# Patient Record
Sex: Female | Born: 1935 | Race: White | Hispanic: No | Smoking: Former smoker
Health system: Southern US, Community
[De-identification: ages and names within clinical notes are randomized; demographics above are authoritative.]

## PROBLEM LIST (undated history)

## (undated) DIAGNOSIS — I10 Essential (primary) hypertension: Secondary | ICD-10-CM

## (undated) DIAGNOSIS — I82409 Acute embolism and thrombosis of unspecified deep veins of unspecified lower extremity: Secondary | ICD-10-CM

## (undated) DIAGNOSIS — K219 Gastro-esophageal reflux disease without esophagitis: Secondary | ICD-10-CM

## (undated) DIAGNOSIS — I442 Atrioventricular block, complete: Secondary | ICD-10-CM

## (undated) DIAGNOSIS — F329 Major depressive disorder, single episode, unspecified: Secondary | ICD-10-CM

## (undated) DIAGNOSIS — N390 Urinary tract infection, site not specified: Secondary | ICD-10-CM

## (undated) DIAGNOSIS — E876 Hypokalemia: Secondary | ICD-10-CM

## (undated) DIAGNOSIS — F32A Depression, unspecified: Secondary | ICD-10-CM

## (undated) DIAGNOSIS — K56609 Unspecified intestinal obstruction, unspecified as to partial versus complete obstruction: Secondary | ICD-10-CM

## (undated) HISTORY — PX: INSERT / REPLACE / REMOVE PACEMAKER: SUR710

## (undated) HISTORY — DX: Essential (primary) hypertension: I10

## (undated) HISTORY — DX: Urinary tract infection, site not specified: N39.0

## (undated) HISTORY — DX: Gastro-esophageal reflux disease without esophagitis: K21.9

## (undated) HISTORY — DX: Major depressive disorder, single episode, unspecified: F32.9

## (undated) HISTORY — DX: Acute embolism and thrombosis of unspecified deep veins of unspecified lower extremity: I82.409

## (undated) HISTORY — DX: Depression, unspecified: F32.A

## (undated) HISTORY — DX: Atrioventricular block, complete: I44.2

## (undated) HISTORY — DX: Unspecified intestinal obstruction, unspecified as to partial versus complete obstruction: K56.609

## (undated) HISTORY — DX: Hypokalemia: E87.6

## (undated) SURGERY — ERCP, WITH INTERVENTION IF INDICATED
Anesthesia: Choice

---

## 1998-02-08 ENCOUNTER — Other Ambulatory Visit: Admission: RE | Admit: 1998-02-08 | Discharge: 1998-02-08 | Payer: Self-pay | Admitting: Family Medicine

## 1999-04-13 ENCOUNTER — Ambulatory Visit (HOSPITAL_COMMUNITY): Admission: RE | Admit: 1999-04-13 | Discharge: 1999-04-13 | Payer: Self-pay | Admitting: Gastroenterology

## 1999-04-26 ENCOUNTER — Encounter: Payer: Self-pay | Admitting: General Surgery

## 1999-04-28 ENCOUNTER — Encounter (INDEPENDENT_AMBULATORY_CARE_PROVIDER_SITE_OTHER): Payer: Self-pay | Admitting: Specialist

## 1999-04-28 ENCOUNTER — Inpatient Hospital Stay (HOSPITAL_COMMUNITY): Admission: RE | Admit: 1999-04-28 | Discharge: 1999-05-10 | Payer: Self-pay | Admitting: General Surgery

## 1999-05-05 ENCOUNTER — Encounter: Payer: Self-pay | Admitting: General Surgery

## 1999-05-06 ENCOUNTER — Encounter: Payer: Self-pay | Admitting: General Surgery

## 1999-05-07 ENCOUNTER — Encounter: Payer: Self-pay | Admitting: General Surgery

## 2000-06-13 ENCOUNTER — Other Ambulatory Visit: Admission: RE | Admit: 2000-06-13 | Discharge: 2000-06-13 | Payer: Self-pay | Admitting: Obstetrics and Gynecology

## 2000-09-07 ENCOUNTER — Emergency Department (HOSPITAL_COMMUNITY): Admission: EM | Admit: 2000-09-07 | Discharge: 2000-09-07 | Payer: Self-pay | Admitting: *Deleted

## 2000-10-02 ENCOUNTER — Encounter: Payer: Self-pay | Admitting: Family Medicine

## 2000-10-02 ENCOUNTER — Ambulatory Visit (HOSPITAL_COMMUNITY): Admission: RE | Admit: 2000-10-02 | Discharge: 2000-10-02 | Payer: Self-pay | Admitting: Family Medicine

## 2001-06-19 ENCOUNTER — Other Ambulatory Visit: Admission: RE | Admit: 2001-06-19 | Discharge: 2001-06-19 | Payer: Self-pay | Admitting: Obstetrics and Gynecology

## 2002-05-21 ENCOUNTER — Ambulatory Visit (HOSPITAL_COMMUNITY): Admission: RE | Admit: 2002-05-21 | Discharge: 2002-05-21 | Payer: Self-pay | Admitting: Gastroenterology

## 2002-05-21 ENCOUNTER — Encounter (INDEPENDENT_AMBULATORY_CARE_PROVIDER_SITE_OTHER): Payer: Self-pay | Admitting: Specialist

## 2002-09-19 ENCOUNTER — Encounter: Payer: Self-pay | Admitting: *Deleted

## 2002-09-19 ENCOUNTER — Emergency Department (HOSPITAL_COMMUNITY): Admission: EM | Admit: 2002-09-19 | Discharge: 2002-09-19 | Payer: Self-pay | Admitting: *Deleted

## 2002-11-06 ENCOUNTER — Encounter: Payer: Self-pay | Admitting: Internal Medicine

## 2002-11-06 ENCOUNTER — Inpatient Hospital Stay (HOSPITAL_COMMUNITY): Admission: EM | Admit: 2002-11-06 | Discharge: 2002-11-08 | Payer: Self-pay | Admitting: Emergency Medicine

## 2002-11-08 ENCOUNTER — Encounter: Payer: Self-pay | Admitting: Cardiology

## 2005-03-24 ENCOUNTER — Encounter (INDEPENDENT_AMBULATORY_CARE_PROVIDER_SITE_OTHER): Payer: Self-pay | Admitting: *Deleted

## 2005-03-24 ENCOUNTER — Ambulatory Visit (HOSPITAL_COMMUNITY): Admission: RE | Admit: 2005-03-24 | Discharge: 2005-03-24 | Payer: Self-pay | Admitting: Gastroenterology

## 2008-09-08 ENCOUNTER — Inpatient Hospital Stay (HOSPITAL_COMMUNITY): Admission: AD | Admit: 2008-09-08 | Discharge: 2008-09-12 | Payer: Self-pay | Admitting: Cardiovascular Disease

## 2008-09-08 ENCOUNTER — Ambulatory Visit: Payer: Self-pay | Admitting: Diagnostic Radiology

## 2008-09-08 ENCOUNTER — Encounter: Payer: Self-pay | Admitting: Emergency Medicine

## 2008-09-09 ENCOUNTER — Encounter (INDEPENDENT_AMBULATORY_CARE_PROVIDER_SITE_OTHER): Payer: Self-pay | Admitting: *Deleted

## 2009-07-06 ENCOUNTER — Encounter: Payer: Self-pay | Admitting: Internal Medicine

## 2010-01-25 ENCOUNTER — Encounter: Payer: Self-pay | Admitting: Internal Medicine

## 2010-02-28 ENCOUNTER — Inpatient Hospital Stay (HOSPITAL_COMMUNITY): Admission: EM | Admit: 2010-02-28 | Discharge: 2010-03-07 | Payer: Self-pay | Admitting: Emergency Medicine

## 2010-03-02 ENCOUNTER — Ambulatory Visit: Payer: Self-pay | Admitting: Vascular Surgery

## 2010-03-02 ENCOUNTER — Ambulatory Visit: Payer: Self-pay | Admitting: Cardiology

## 2010-03-02 ENCOUNTER — Encounter (INDEPENDENT_AMBULATORY_CARE_PROVIDER_SITE_OTHER): Payer: Self-pay | Admitting: Internal Medicine

## 2010-03-04 ENCOUNTER — Ambulatory Visit: Payer: Self-pay | Admitting: Physical Medicine & Rehabilitation

## 2010-03-07 ENCOUNTER — Inpatient Hospital Stay (HOSPITAL_COMMUNITY)
Admission: RE | Admit: 2010-03-07 | Discharge: 2010-03-08 | Payer: Self-pay | Admitting: Physical Medicine & Rehabilitation

## 2010-03-08 ENCOUNTER — Inpatient Hospital Stay (HOSPITAL_COMMUNITY): Admission: AD | Admit: 2010-03-08 | Discharge: 2010-03-11 | Payer: Self-pay | Admitting: Gastroenterology

## 2010-03-11 ENCOUNTER — Ambulatory Visit: Payer: Self-pay | Admitting: Physical Medicine & Rehabilitation

## 2010-03-11 ENCOUNTER — Inpatient Hospital Stay (HOSPITAL_COMMUNITY)
Admission: RE | Admit: 2010-03-11 | Discharge: 2010-03-23 | Payer: Self-pay | Admitting: Physical Medicine & Rehabilitation

## 2010-05-07 ENCOUNTER — Encounter: Payer: Self-pay | Admitting: Internal Medicine

## 2010-06-27 ENCOUNTER — Ambulatory Visit: Payer: Self-pay | Admitting: Internal Medicine

## 2010-07-06 ENCOUNTER — Ambulatory Visit: Payer: Self-pay | Admitting: Internal Medicine

## 2010-07-06 DIAGNOSIS — E039 Hypothyroidism, unspecified: Secondary | ICD-10-CM

## 2010-07-06 DIAGNOSIS — M109 Gout, unspecified: Secondary | ICD-10-CM | POA: Insufficient documentation

## 2010-07-06 DIAGNOSIS — E876 Hypokalemia: Secondary | ICD-10-CM

## 2010-07-06 DIAGNOSIS — D649 Anemia, unspecified: Secondary | ICD-10-CM | POA: Insufficient documentation

## 2010-07-06 DIAGNOSIS — I10 Essential (primary) hypertension: Secondary | ICD-10-CM

## 2010-07-06 LAB — CONVERTED CEMR LAB
BUN: 27 mg/dL — ABNORMAL HIGH (ref 6–23)
Basophils Relative: 0.3 % (ref 0.0–3.0)
CO2: 30 meq/L (ref 19–32)
Creatinine, Ser: 0.7 mg/dL (ref 0.4–1.2)
Eosinophils Absolute: 0.1 10*3/uL (ref 0.0–0.7)
Eosinophils Relative: 0.9 % (ref 0.0–5.0)
HCT: 35.3 % — ABNORMAL LOW (ref 36.0–46.0)
MCV: 80.6 fL (ref 78.0–100.0)
Monocytes Absolute: 0.6 10*3/uL (ref 0.1–1.0)
Monocytes Relative: 10 % (ref 3.0–12.0)
Neutrophils Relative %: 63.9 % (ref 43.0–77.0)
Potassium: 4.2 meq/L (ref 3.5–5.1)
RBC: 4.37 M/uL (ref 3.87–5.11)
WBC: 6.3 10*3/uL (ref 4.5–10.5)

## 2010-07-07 ENCOUNTER — Encounter: Payer: Self-pay | Admitting: Internal Medicine

## 2010-07-12 ENCOUNTER — Ambulatory Visit (HOSPITAL_COMMUNITY)
Admission: RE | Admit: 2010-07-12 | Discharge: 2010-07-12 | Payer: Self-pay | Source: Home / Self Care | Attending: Internal Medicine | Admitting: Internal Medicine

## 2010-07-19 ENCOUNTER — Encounter: Payer: Self-pay | Admitting: Internal Medicine

## 2010-07-20 ENCOUNTER — Ambulatory Visit: Payer: Self-pay

## 2010-08-05 ENCOUNTER — Encounter: Payer: Self-pay | Admitting: Cardiovascular Disease

## 2010-08-05 DIAGNOSIS — F329 Major depressive disorder, single episode, unspecified: Secondary | ICD-10-CM | POA: Insufficient documentation

## 2010-08-05 DIAGNOSIS — F32A Depression, unspecified: Secondary | ICD-10-CM | POA: Insufficient documentation

## 2010-08-05 DIAGNOSIS — K219 Gastro-esophageal reflux disease without esophagitis: Secondary | ICD-10-CM | POA: Insufficient documentation

## 2010-08-05 DIAGNOSIS — I495 Sick sinus syndrome: Secondary | ICD-10-CM | POA: Insufficient documentation

## 2010-08-05 DIAGNOSIS — I442 Atrioventricular block, complete: Secondary | ICD-10-CM | POA: Insufficient documentation

## 2010-08-05 DIAGNOSIS — I82409 Acute embolism and thrombosis of unspecified deep veins of unspecified lower extremity: Secondary | ICD-10-CM | POA: Insufficient documentation

## 2010-08-05 DIAGNOSIS — K56609 Unspecified intestinal obstruction, unspecified as to partial versus complete obstruction: Secondary | ICD-10-CM | POA: Insufficient documentation

## 2010-08-05 DIAGNOSIS — R079 Chest pain, unspecified: Secondary | ICD-10-CM | POA: Insufficient documentation

## 2010-08-05 DIAGNOSIS — Z950 Presence of cardiac pacemaker: Secondary | ICD-10-CM | POA: Insufficient documentation

## 2010-08-05 DIAGNOSIS — N390 Urinary tract infection, site not specified: Secondary | ICD-10-CM | POA: Insufficient documentation

## 2010-08-25 NOTE — Cardiovascular Report (Signed)
Summary: Office Visit   Office Visit   Imported By: Roderic Ovens 07/14/2010 13:55:21  _____________________________________________________________________  External Attachment:    Type:   Image     Comment:   External Document

## 2010-08-25 NOTE — Letter (Signed)
Summary: Implantable Device Instructions  Architectural technologist, Main Office  [address]   [place], [place] [address]   Phone: [phone]  Fax: [phone]      Implantable Device Instructions  You are scheduled for:   _____ Generator Change  on 07/12/10 with Dr. Johney Frame.  1.  Please arrive at the Short Stay Center at [facility] at 12:30 on the day of your procedure.  2.  Do not eat or drinkafter midnight  the night before your procedure. Clear liquids until 7am  3.  Complete lab work on 07/06/10.  .  You do not have to be fasting.  4. You may take you medications with a small amount of water.  5.  Plan for an overnight stay.  Bring your insurance cards and a list of your medications.  6.  Wash your chest and neck with antibacterial soap (any brand) the evening before and the morning of your procedure.  Rinse well.    *If you have ANY questions after you get home, please call the office [phone]. Anselm Pancoast  *Every attempt is made to prevent procedures from being rescheduled.  Due to the nauture of Electrophysiology, rescheduling can happen.  The physician is always aware and directs the staff when this occurs.

## 2010-08-25 NOTE — Assessment & Plan Note (Signed)
Summary: labs   

## 2010-08-25 NOTE — Letter (Signed)
Summary: [facility] Progress Notes   [facility] Progress Notes   Imported By: Roderic Ovens 07/27/2010 15:01:35  _____________________________________________________________________  External Attachment:    Type:   Image     Comment:   External Document

## 2010-08-25 NOTE — Cardiovascular Report (Signed)
Summary: Office Visit   Office Visit   Imported By: Roderic Ovens 07/04/2010 13:16:33  _____________________________________________________________________  External Attachment:    Type:   Image     Comment:   External Document

## 2010-08-25 NOTE — Letter (Signed)
Summary: [facility] Progress Notes   [facility] Progress Notes   Imported By: Roderic Ovens 07/27/2010 15:01:13  _____________________________________________________________________  External Attachment:    Type:   Image     Comment:   External Document

## 2010-08-25 NOTE — Procedures (Signed)
Summary: wch.gd   Allergies (verified): 1)  ! Penicillin 2)  ! Jonne Ply  PPM Specifications Following MD:  Hillis Range, MD     Referring MD:  Shaune Pascal, MD PPM Vendor:  Medtronic     PPM Model Number:  [id number]     PPM Serial Number:  [id number] PPM DOI:  07/13/2010     PPM Implanting MD:  Hillis Range, MD  Lead 1    Location: RA     DOI: 11/07/2002     Model #: [id number]     Serial #: [id number]     Status: active Lead 2    Location: RV     DOI: 11/07/2002     Model #: [id number]     Serial #: [id number]     Status: active  Magnet Response Rate:  BOL 85 ERI 65  Indications:  CHB  Explantation Comments:  07/13/10 Medtronic [id number] H explanted.  PPM Follow Up Remote Check?  No Battery Voltage:  2.79 V     Battery Est. Longevity:  11 years     Pacer Dependent:  Yes       PPM Device Measurements Atrium  Amplitude: 4.0 mV, Impedance: 470 ohms, Threshold: 0.625 V at 0.4 msec Right Ventricle  Impedance: 576 ohms, Threshold: 0.75 V at 0.4 msec  Episodes MS Episodes:  0     Percent Mode Switch:  0     Coumadin:  No Ventricular High Rate:  0     Atrial Pacing:  82.3%     Ventricular Pacing:  100%  Parameters Mode:  VVI     Lower Rate Limit:  65     Next Cardiology Appt Due:  10/21/2010 Tech Comments:  Steri strips removed, no redness or edema noted.  No parameter changes.  Device function normal.   ROV 10/21/10 with Dr. Johney Frame. Altha Harm, LPN  July 20, 2010 4:25 PM

## 2010-08-25 NOTE — Miscellaneous (Signed)
Summary: Device preload  Clinical Lists Changes  Observations: Added new observation of PPM INDICATN: CHB (05/07/2010 15:26) Added new observation of MAGNET RTE: BOL 85 ERI 65 (05/07/2010 15:26) Added new observation of PPMLEADSTAT2: active (05/07/2010 15:26) Added new observation of PPMLEADSER2: [id number] (05/07/2010 15:26) Added new observation of PPMLEADMOD2: 5092  (05/07/2010 15:26) Added new observation of PPMLEADDOI2: 11/07/2002  (05/07/2010 15:26) Added new observation of PPMLEADLOC2: RV  (05/07/2010 15:26) Added new observation of PPMLEADSTAT1: active  (05/07/2010 15:26) Added new observation of PPMLEADSER1: [id number] V  (05/07/2010 15:26) Added new observation of PPMLEADMOD1: 5076  (05/07/2010 15:26) Added new observation of PPMLEADDOI1: 11/07/2002  (05/07/2010 15:26) Added new observation of PPMLEADLOC1: RA  (05/07/2010 15:26) Added new observation of PPM DOI: 11/07/2002  (05/07/2010 15:26) Added new observation of PPM SERL#: [id number]  (05/07/2010 15:26) Added new observation of PPM MODL#: [id number]  (05/07/2010 01:02) Added new observation of PACEMAKERMFG: Medtronic  (05/07/2010 15:26) Added new observation of PPM REFER MD: Shaune Pascal, MD  (05/07/2010 15:26) Added new observation of PACEMAKER MD: Hillis Range, MD  (05/07/2010 15:26)      PPM Specifications Following MD:  Hillis Range, MD     Referring MD:  Shaune Pascal, MD PPM Vendor:  Medtronic     PPM Model Number:  [id number]     PPM Serial Number:  [id number] PPM DOI:  11/07/2002      Lead 1    Location: RA     DOI: 11/07/2002     Model #: [id number]     Serial #: [id number]     Status: active Lead 2    Location: RV     DOI: 11/07/2002     Model #: [id number]     Serial #: [id number]     Status: active  Magnet Response Rate:  BOL 85 ERI 65  Indications:  CHB

## 2010-08-25 NOTE — Procedures (Signed)
Summary: pacer check/medtronic   Current Medications (verified): 1)  Diltiazem Hcl Cr 180 Mg Xr24h-Cap (Diltiazem Hcl) .... One By Mouth Daily 2)  Allopurinol 100 Mg Tabs (Allopurinol) .... One By Mouth Daily 3)  Synthroid 125 Mcg Tabs (Levothyroxine Sodium) .... One By Mouth Daily 4)  Gelnique 10 % Gel (Oxybutynin Chloride) .... Daily 5)  Alprazolam 0.25 Mg Tabs (Alprazolam) .... One By Mouth At Bedtime  Allergies (verified): 1)  ! Penicillin 2)  ! Jonne Ply  PPM Specifications Following MD:  Hillis Range, MD     Referring MD:  Shaune Pascal, MD PPM Vendor:  Medtronic     PPM Model Number:  [id number]     PPM Serial Number:  [id number] PPM DOI:  11/07/2002      Lead 1    Location: RA     DOI: 11/07/2002     Model #: [id number]     Serial #: [id number]     Status: active Lead 2    Location: RV     DOI: 11/07/2002     Model #: [id number]     Serial #: [id number]     Status: active  Magnet Response Rate:  BOL 85 ERI 65  Indications:  CHB   PPM Follow Up Remote Check?  No Battery Voltage:  ERI V     Right Ventricle  Amplitude: PACED mV, Impedance: 609 ohms,   Episodes MS Episodes:  0     Percent Mode Switch:  0     Ventricular High Rate:  0     Ventricular Pacing:  98.8%  Parameters Mode:  VVI     Lower Rate Limit:  65     Next Cardiology Appt Due:  07/06/2010 Tech Comments:  DEVICE REACHED ERI ON 06-27-10.  DEVICE HAS SWITCHED TO VVI 65.  ROV 07-06-10 @ 1100 W/JA. Vella Kohler  June 27, 2010 11:56 AM   MED LIST NOT COMPLETE ON TODAYS OFFICE VISIT.   Patient Instructions: 1)  Pt scheduled to see Dr Johney Frame on 07-06-10 @ 1100am.

## 2010-08-25 NOTE — Miscellaneous (Signed)
Summary: Device change out  Clinical Lists Changes  Observations: Added new observation of PPM IMP MD: Hillis Range, MD (07/19/2010 16:00) Added new observation of PPM DOI: 07/13/2010 (07/19/2010 16:00) Added new observation of PPM SERL#: [id number] (07/19/2010 16:00) Added new observation of PPM MODL#: [id number] (07/19/2010 16:00) Added new observation of PPMEXPLCOMM: 07/13/10 Medtronic [id number] H explanted. (07/19/2010 16:00)      PPM Specifications Following MD:  Hillis Range, MD     Referring MD:  Shaune Pascal, MD PPM Vendor:  Medtronic     PPM Model Number:  ADDRL1     PPM Serial Number:  [id number] H PPM DOI:  07/13/2010     PPM Implanting MD:  Hillis Range, MD  Lead 1    Location: RA     DOI: 11/07/2002     Model #: [id number]     Serial #: [id number]     Status: active Lead 2    Location: RV     DOI: 11/07/2002     Model #: [id number]     Serial #: [id number]     Status: active  Magnet Response Rate:  BOL 85 ERI 65  Indications:  CHB  Explantation Comments:  07/13/10 Medtronic [id number] H explanted.  Parameters Mode:  VVI     Lower Rate Limit:  65

## 2010-08-25 NOTE — Assessment & Plan Note (Signed)
Summary: reached eri/mt   Visit Type:  Follow-up Referring Provider:  Dr Elease Hashimoto   History of Present Illness: [name] is a pleasant 77 yo WF with a h/o sick sinus syndrome s/p PPM (MDT) implanted 11/07/2002 by Dr Ty Hilts who presents today to establish care in the pacemaker clinic.  SHe has done well s/p PPM.  Most recently, she developed GI bleeding and was hospitalized 8/11.  She reports fatigue since that time.  Recent pacemaker check reveals that she has reached ERI battery status and has converted to VVI backup pacing.  The patient denies symptoms of palpitations, chest pain, shortness of breath, orthopnea, PND, lower extremity edema, dizziness, presyncope, syncope, or neurologic sequela. The patient is tolerating medications without difficulties and is otherwise without complaint today.   Allergies: 1)  ! Penicillin 2)  ! Jonne Ply  Past History:  Past Medical History: Recent GI bleeding GOUT (ICD-274.9) HYPOKALEMIA (ICD-276.8) HYPOTHYROIDISM (ICD-244.9) ANEMIA (ICD-285.9) SICK SINUS SYNDROME s/p PPM 2004 HYPERTENSION (ICD-401.9)  Past Surgical History: s/p MDT Pacemaker 2004  Family History: Reviewed history from 07/06/2010 and no changes required.  Her son died of an MI in his early 55s, and her father   died in his 12s with a CVA.      Social History: Reviewed history from 07/06/2010 and no changes required. She lives at home alone, and her great grandson and   sister-in-law assist her as needed.  She denies tobacco.  She also   denies alcohol.      Review of Systems       All systems are reviewed and negative except as listed in the HPI.   Vital Signs:  Patient profile:   77 year old female Height:      64 inches Weight:      142 pounds BMI:     24.46 Pulse rate:   64 / minute BP sitting:   90 / 60  (left arm)  Vitals Entered By: Laurance Flatten CMA (July 06, 2010 11:27 AM)  Physical Exam  General:  elderly female, NAD Head:  normocephalic and  atraumatic Eyes:  PERRLA/EOM intact; conjunctiva and lids normal. Mouth:  Teeth, gums and palate normal. Oral mucosa normal. Neck:  supple Chest Wall:  L sided pacemaker pocket is well healed Lungs:  Clear bilaterally to auscultation and percussion. Heart:  RRR (Paced) Abdomen:  Bowel sounds positive; abdomen soft and non-tender without masses, organomegaly, or hernias noted. No hepatosplenomegaly. Msk:  diffuse muscle atrophy Extremities:  trace edema Neurologic:  Alert and oriented x 3. Skin:  Intact without lesions or rashes.   PPM Specifications Following MD:  Hillis Range, MD     Referring MD:  Shaune Pascal, MD PPM Vendor:  Medtronic     PPM Model Number:  [id number]     PPM Serial Number:  [id number] PPM DOI:  11/07/2002      Lead 1    Location: RA     DOI: 11/07/2002     Model #: 9147     Serial #: [id number]     Status: active Lead 2    Location: RV     DOI: 11/07/2002     Model #: 8657     Serial #: [id number]     Status: active  Magnet Response Rate:  BOL 85 ERI 65  Indications:  CHB   Parameters Mode:  VVI     Lower Rate Limit:  65     Tech Comments:  DEVICE REACHED ERI ON 06-27-10. SWITCHED  TO VVI 65. Vella Kohler MD Comments:  agree  Impression & Recommendations:  Problem # 1:  SICK SINUS SYNDROME (ICD-427.81) her pacemaker has reached ERI battery status. Risks, benefits, alternatives to pacemaker pulse generator replacement were discussed in detail with the patient today.  She understands that the risks include but are not limited to bleeding, infection, MI, stroke, and death.  She accepts these risks and wishes to proceed.   We will schedule PPM pulse generator replacement at the next available time.   Problem # 2:  HYPERTENSION (ICD-401.9) stable no changes  Problem # 3:  HYPOTHYROIDISM (ICD-244.9) stable  Her updated medication list for this problem includes:    Levoxyl 112 Mcg Tabs (Levothyroxine sodium) ..... Once daily  Problem # 4:  ANEMIA  (ICD-285.9) denies further bleeding we will check CBC prior to PPM procedure

## 2010-08-25 NOTE — Cardiovascular Report (Signed)
Summary: Pre Op Orders   Pre Op Orders   Imported By: Roderic Ovens 07/29/2010 16:15:04  _____________________________________________________________________  External Attachment:    Type:   Image     Comment:   External Document

## 2010-08-29 ENCOUNTER — Encounter: Payer: Self-pay | Admitting: Cardiovascular Disease

## 2010-08-29 ENCOUNTER — Ambulatory Visit (INDEPENDENT_AMBULATORY_CARE_PROVIDER_SITE_OTHER): Payer: PRIVATE HEALTH INSURANCE | Admitting: Cardiovascular Disease

## 2010-08-29 VITALS — BP 118/60 | HR 66 | Wt 143.6 lb

## 2010-08-29 DIAGNOSIS — E039 Hypothyroidism, unspecified: Secondary | ICD-10-CM

## 2010-08-29 DIAGNOSIS — Z950 Presence of cardiac pacemaker: Secondary | ICD-10-CM

## 2010-08-29 DIAGNOSIS — R079 Chest pain, unspecified: Secondary | ICD-10-CM

## 2010-08-29 MED ORDER — LEVOTHYROXINE SODIUM 112 MCG PO TABS: 112.0000 ug | ORAL_TABLET | Freq: Every day | ORAL | Status: DC

## 2010-08-29 NOTE — Assessment & Plan Note (Signed)
Her chest pain seems to be well-controlled. She's not having any further episodes. We will continue to follow.

## 2010-08-29 NOTE — Progress Notes (Signed)
Patient Active Problem List  Diagnoses  . Chest pain, unspecified  . Hypothyroid  . Small bowel obstruction  . Pacemaker  . Depression  . Hypertension  . AV block  . Hypokalemia  . Complete heart block  . Recurrent UTI  . SSS (sick sinus syndrome)  . GOUT  . GERD (gastroesophageal reflux disease)  . DVT (deep venous thrombosis)  . HYPOTHYROIDISM  . HYPOKALEMIA  . ANEMIA  . HYPERTENSION  . SICK SINUS SYNDROME   Current Outpatient Prescriptions on File Prior to Visit  Medication Sig Dispense Refill  . diltiazem (DILACOR XR) 180 MG 24 hr capsule Take 180 mg by mouth daily.        Marland Kitchen allopurinol (ZYLOPRIM) 100 MG tablet Take 100 mg by mouth daily.        Marland Kitchen ALPRAZolam (XANAX PO) Take by mouth as needed.        Marland Kitchen aspirin 81 MG tablet Take 81 mg by mouth daily.        . benazepril-hydrochlorthiazide (LOTENSIN HCT) 20-12.5 MG per tablet Take 1 tablet by mouth daily.        Marland Kitchen levothyroxine (SYNTHROID, LEVOTHROID) 125 MCG tablet Take 125 mcg by mouth daily.        . Oxybutynin Chloride (GELNIQUE TD) Place onto the skin daily.          Mrs. Katina Degree is an elderly female who is seen today in followup. She has stopped a lot of her medications. She's not had any episodes of chest pain or shortness of breath.  The patient is alert and oriented x 3.  The mood and affect are normal.  The HEENT exam reveals that the sclera are nonicteric.  The mucous membranes are moist.  The carotids are 2+ without bruits.  There is no thyromegaly.  There is no JVD.  The lungs are clear.  The chest wall is non tender.  The heart exam reveals a regular rate with a normal S1 and S2.  There are no murmurs, gallops, or rubs.  The PMI is not displaced.   Abdominal exam reveals good bowel sounds.  There is no guarding or rebound.  There is no hepatosplenomegaly or tenderness.  There are no masses.  Exam of the legs reveal no clubbing, cyanosis, or edema.  The legs are without rashes.  The distal pulses are intact.   Cranial nerves II - XII are intact.  Motor and sensory functions are intact.  The gait is normal.

## 2010-08-29 NOTE — Assessment & Plan Note (Signed)
This is been followed by her medical doctor.Marland Kitchen

## 2010-08-29 NOTE — Assessment & Plan Note (Signed)
She will continue to followup in pacer clinic.

## 2010-09-28 ENCOUNTER — Encounter (INDEPENDENT_AMBULATORY_CARE_PROVIDER_SITE_OTHER): Payer: Self-pay | Admitting: *Deleted

## 2010-10-04 NOTE — Letter (Signed)
Summary: Appointment - Reschedule  [employer], Main Office  [address]   [place], [place] [address]   Phone: [phone]  Fax: [phone]     September 28, 2010 MRN: [id number]   [facility] [address] [place], [place]  [address]   Dear [name],   Due to a change in our office schedule, your appointment on  3.30.12 at   9:20am     must be changed.  It is very important that we reach you to reschedule this appointment. We look forward to participating in your health care needs. Please contact us at the number listed above at your earliest convenience to reschedule this appointment.     Sincerely,  Glass blower/designer

## 2010-10-06 LAB — URINE MICROSCOPIC-ADD ON

## 2010-10-06 LAB — BASIC METABOLIC PANEL
BUN: 15 mg/dL (ref 6–23)
BUN: 9 mg/dL (ref 6–23)
CO2: 22 mEq/L (ref 19–32)
Calcium: 6.9 mg/dL — ABNORMAL LOW (ref 8.4–10.5)
Calcium: 7.8 mg/dL — ABNORMAL LOW (ref 8.4–10.5)
Calcium: 8.5 mg/dL (ref 8.4–10.5)
Chloride: 110 mEq/L (ref 96–112)
Chloride: 112 mEq/L (ref 96–112)
Creatinine, Ser: 0.5 mg/dL (ref 0.4–1.2)
Creatinine, Ser: 0.61 mg/dL (ref 0.4–1.2)
GFR calc Af Amer: >60 mL/min (ref >60)
GFR calc Af Amer: >60 mL/min (ref >60)
GFR calc Af Amer: >60 mL/min (ref >60)
GFR calc non Af Amer: >60 mL/min (ref >60)
Glucose, Bld: 105 mg/dL — ABNORMAL HIGH (ref 70–99)
Glucose, Bld: 98 mg/dL (ref 70–99)
Sodium: 138 mEq/L (ref 135–145)

## 2010-10-06 LAB — URINALYSIS, ROUTINE W REFLEX MICROSCOPIC
Glucose, UA: NEGATIVE mg/dL
Nitrite: NEGATIVE
Protein, ur: NEGATIVE mg/dL
Specific Gravity, Urine: 1.019 (ref 1.005–1.030)
Urobilinogen, UA: 0.2 mg/dL (ref 0.0–1.0)
pH: 5.5 (ref 5.0–8.0)

## 2010-10-06 LAB — URINE CULTURE: Colony Count: 60000

## 2010-10-06 LAB — CROSSMATCH: ABO/RH(D): O POS

## 2010-10-06 LAB — CBC
HCT: 20.9 % — ABNORMAL LOW (ref 36.0–46.0)
Hemoglobin: 7.1 g/dL — ABNORMAL LOW (ref 12.0–15.0)
Hemoglobin: 8.7 g/dL — ABNORMAL LOW (ref 12.0–15.0)
Hemoglobin: 9.4 g/dL — ABNORMAL LOW (ref 12.0–15.0)
MCH: 29.2 pg (ref 26.0–34.0)
MCH: 29.3 pg (ref 26.0–34.0)
MCHC: 32.9 g/dL (ref 30.0–36.0)
MCHC: 33.3 g/dL (ref 30.0–36.0)
MCHC: 34 g/dL (ref 30.0–36.0)
MCHC: 34 g/dL (ref 30.0–36.0)
MCV: 86 fL (ref 78.0–100.0)
MCV: 86.1 fL (ref 78.0–100.0)
MCV: 86.9 fL (ref 78.0–100.0)
MCV: 87.8 fL (ref 78.0–100.0)
Platelets: 126 10*3/uL — ABNORMAL LOW (ref 150–400)
Platelets: 131 10*3/uL — ABNORMAL LOW (ref 150–400)
Platelets: 203 10*3/uL (ref 150–400)
RBC: 2.73 MIL/uL — ABNORMAL LOW (ref 3.87–5.11)
RBC: 2.82 MIL/uL — ABNORMAL LOW (ref 3.87–5.11)
RBC: 3.04 MIL/uL — ABNORMAL LOW (ref 3.87–5.11)
RDW: 15 % (ref 11.5–15.5)
RDW: 15.4 % (ref 11.5–15.5)
RDW: 15.6 % — ABNORMAL HIGH (ref 11.5–15.5)
RDW: 15.7 % — ABNORMAL HIGH (ref 11.5–15.5)
WBC: 5.3 10*3/uL (ref 4.0–10.5)
WBC: 6.1 10*3/uL (ref 4.0–10.5)
WBC: 8.6 10*3/uL (ref 4.0–10.5)

## 2010-10-06 LAB — COMPREHENSIVE METABOLIC PANEL
ALT: 8 U/L (ref 0–35)
ALT: <8 U/L (ref 0–35)
AST: 13 U/L (ref 0–37)
Alkaline Phosphatase: 55 U/L (ref 39–117)
Alkaline Phosphatase: 56 U/L (ref 39–117)
CO2: 25 mEq/L (ref 19–32)
Calcium: 8 mg/dL — ABNORMAL LOW (ref 8.4–10.5)
Calcium: 8.1 mg/dL — ABNORMAL LOW (ref 8.4–10.5)
Chloride: 113 mEq/L — ABNORMAL HIGH (ref 96–112)
Creatinine, Ser: 0.71 mg/dL (ref 0.4–1.2)
GFR calc Af Amer: >60 mL/min (ref >60)
GFR calc non Af Amer: >60 mL/min (ref >60)
Glucose, Bld: 106 mg/dL — ABNORMAL HIGH (ref 70–99)
Potassium: 3.5 mEq/L (ref 3.5–5.1)
Potassium: 3.5 mEq/L (ref 3.5–5.1)
Sodium: 139 mEq/L (ref 135–145)
Total Bilirubin: 0.3 mg/dL (ref 0.3–1.2)
Total Protein: 4.6 g/dL — ABNORMAL LOW (ref 6.0–8.3)

## 2010-10-06 LAB — DIFFERENTIAL
Basophils Absolute: 0 10*3/uL (ref 0.0–0.1)
Basophils Relative: 0 % (ref 0–1)
Eosinophils Absolute: 0.1 10*3/uL (ref 0.0–0.7)
Eosinophils Absolute: 0.2 10*3/uL (ref 0.0–0.7)
Eosinophils Relative: 0 % (ref 0–5)
Eosinophils Relative: 3 % (ref 0–5)
Lymphocytes Relative: 28 % (ref 12–46)
Lymphs Abs: 1.3 10*3/uL (ref 0.7–4.0)
Monocytes Relative: 8 % (ref 3–12)

## 2010-10-06 LAB — HEMOGLOBIN AND HEMATOCRIT, BLOOD
HCT: 21 % — ABNORMAL LOW (ref 36.0–46.0)
HCT: 21.7 % — ABNORMAL LOW (ref 36.0–46.0)
Hemoglobin: 7.3 g/dL — ABNORMAL LOW (ref 12.0–15.0)
Hemoglobin: 8.6 g/dL — ABNORMAL LOW (ref 12.0–15.0)

## 2010-10-06 LAB — MRSA PCR SCREENING: MRSA by PCR: NEGATIVE

## 2010-10-06 LAB — ABO/RH: ABO/RH(D): O POS

## 2010-10-07 LAB — COMPREHENSIVE METABOLIC PANEL
ALT: 12 U/L (ref 0–35)
AST: 18 U/L (ref 0–37)
Alkaline Phosphatase: 73 U/L (ref 39–117)
CO2: 25 mEq/L (ref 19–32)
Chloride: 103 mEq/L (ref 96–112)
GFR calc Af Amer: >60 mL/min (ref >60)
GFR calc non Af Amer: >60 mL/min (ref >60)
Potassium: 3.3 mEq/L — ABNORMAL LOW (ref 3.5–5.1)
Sodium: 139 mEq/L (ref 135–145)
Total Bilirubin: 0.8 mg/dL (ref 0.3–1.2)

## 2010-10-07 LAB — BASIC METABOLIC PANEL
BUN: 10 mg/dL (ref 6–23)
BUN: 22 mg/dL (ref 6–23)
BUN: 26 mg/dL — ABNORMAL HIGH (ref 6–23)
CO2: 25 mEq/L (ref 19–32)
Calcium: 8.8 mg/dL (ref 8.4–10.5)
Calcium: 9 mg/dL (ref 8.4–10.5)
Calcium: 9.1 mg/dL (ref 8.4–10.5)
Chloride: 103 mEq/L (ref 96–112)
Chloride: 104 mEq/L (ref 96–112)
Creatinine, Ser: 0.72 mg/dL (ref 0.4–1.2)
Creatinine, Ser: 0.74 mg/dL (ref 0.4–1.2)
Creatinine, Ser: 0.77 mg/dL (ref 0.4–1.2)
GFR calc Af Amer: >60 mL/min (ref >60)
GFR calc Af Amer: >60 mL/min (ref >60)
GFR calc Af Amer: >60 mL/min (ref >60)
GFR calc non Af Amer: 51 mL/min — ABNORMAL LOW (ref >60)
GFR calc non Af Amer: >60 mL/min (ref >60)
GFR calc non Af Amer: >60 mL/min (ref >60)
Glucose, Bld: 142 mg/dL — ABNORMAL HIGH (ref 70–99)
Potassium: 5.3 mEq/L — ABNORMAL HIGH (ref 3.5–5.1)
Sodium: 137 mEq/L (ref 135–145)
Sodium: 139 mEq/L (ref 135–145)

## 2010-10-07 LAB — VITAMIN B12: Vitamin B-12: 538 pg/mL (ref 211–911)

## 2010-10-07 LAB — CBC
HCT: 42.5 % (ref 36.0–46.0)
HCT: 43.1 % (ref 36.0–46.0)
Hemoglobin: 11.7 g/dL — ABNORMAL LOW (ref 12.0–15.0)
Hemoglobin: 14.1 g/dL (ref 12.0–15.0)
Hemoglobin: 14.4 g/dL (ref 12.0–15.0)
Hemoglobin: 14.6 g/dL (ref 12.0–15.0)
MCH: 30 pg (ref 26.0–34.0)
MCH: 30.3 pg (ref 26.0–34.0)
MCHC: 33.7 g/dL (ref 30.0–36.0)
MCHC: 33.9 g/dL (ref 30.0–36.0)
MCHC: 34 g/dL (ref 30.0–36.0)
MCV: 89.4 fL (ref 78.0–100.0)
Platelets: 245 10*3/uL (ref 150–400)
Platelets: 250 10*3/uL (ref 150–400)
Platelets: 266 10*3/uL (ref 150–400)
RBC: 4.71 MIL/uL (ref 3.87–5.11)
RBC: 4.74 MIL/uL (ref 3.87–5.11)
RBC: 4.88 MIL/uL (ref 3.87–5.11)
RDW: 14.3 % (ref 11.5–15.5)
RDW: 15 % (ref 11.5–15.5)
RDW: 15.1 % (ref 11.5–15.5)
RDW: 15.3 % (ref 11.5–15.5)
WBC: 11.8 10*3/uL — ABNORMAL HIGH (ref 4.0–10.5)
WBC: 8.7 10*3/uL (ref 4.0–10.5)
WBC: 8.7 10*3/uL (ref 4.0–10.5)
WBC: 9.4 10*3/uL (ref 4.0–10.5)

## 2010-10-07 LAB — POCT CARDIAC MARKERS
CKMB, poc: 2.7 ng/mL (ref 1.0–8.0)
Troponin i, poc: <0.05 ng/mL (ref 0.00–0.09)

## 2010-10-07 LAB — LIPID PANEL
HDL: 46 mg/dL (ref >39)
LDL Cholesterol: 94 mg/dL (ref 0–99)
Triglycerides: 139 mg/dL (ref <150)
VLDL: 28 mg/dL (ref 0–40)

## 2010-10-07 LAB — DIFFERENTIAL
Basophils Absolute: 0 10*3/uL (ref 0.0–0.1)
Eosinophils Absolute: 0.1 10*3/uL (ref 0.0–0.7)
Eosinophils Relative: 1 % (ref 0–5)

## 2010-10-07 LAB — CARDIAC PANEL(CRET KIN+CKTOT+MB+TROPI)
CK, MB: 4.6 ng/mL — ABNORMAL HIGH (ref 0.3–4.0)
Relative Index: INVALID (ref 0.0–2.5)
Total CK: 94 U/L (ref 7–177)
Troponin I: 0.03 ng/mL (ref 0.00–0.06)

## 2010-10-07 LAB — URINE CULTURE
Culture  Setup Time: 201108090200
Special Requests: NEGATIVE

## 2010-10-07 LAB — URINALYSIS, ROUTINE W REFLEX MICROSCOPIC
Bilirubin Urine: NEGATIVE
Ketones, ur: NEGATIVE mg/dL
Nitrite: NEGATIVE
Protein, ur: NEGATIVE mg/dL

## 2010-10-07 LAB — MAGNESIUM: Magnesium: 1.7 mg/dL (ref 1.5–2.5)

## 2010-10-21 ENCOUNTER — Encounter: Payer: Self-pay | Admitting: Internal Medicine

## 2010-11-08 LAB — CBC
HCT: 29.4 % — ABNORMAL LOW (ref 36.0–46.0)
HCT: 30 % — ABNORMAL LOW (ref 36.0–46.0)
HCT: 34.7 % — ABNORMAL LOW (ref 36.0–46.0)
Hemoglobin: 10 g/dL — ABNORMAL LOW (ref 12.0–15.0)
Hemoglobin: 10.1 g/dL — ABNORMAL LOW (ref 12.0–15.0)
MCHC: 34.5 g/dL (ref 30.0–36.0)
MCHC: 34.9 g/dL (ref 30.0–36.0)
MCHC: 33.7 g/dL (ref 30.0–36.0)
MCV: 86.2 fL (ref 78.0–100.0)
MCV: 86.8 fL (ref 78.0–100.0)
MCV: 86.8 fL (ref 78.0–100.0)
MCV: 87.3 fL (ref 78.0–100.0)
Platelets: 157 10*3/uL (ref 150–400)
Platelets: 187 10*3/uL (ref 150–400)
RBC: 3.31 MIL/uL — ABNORMAL LOW (ref 3.87–5.11)
RBC: 3.38 MIL/uL — ABNORMAL LOW (ref 3.87–5.11)
RDW: 13.7 % (ref 11.5–15.5)
RDW: 13.7 % (ref 11.5–15.5)
RDW: 12.9 % (ref 11.5–15.5)
WBC: 7.9 10*3/uL (ref 4.0–10.5)

## 2010-11-08 LAB — DIFFERENTIAL
Basophils Absolute: 0 10*3/uL (ref 0.0–0.1)
Basophils Relative: 3 % — ABNORMAL HIGH (ref 0–1)
Eosinophils Relative: 4 % (ref 0–5)
Eosinophils Relative: 1 % (ref 0–5)
Lymphocytes Relative: 14 % (ref 12–46)
Lymphocytes Relative: 11 % — ABNORMAL LOW (ref 12–46)
Monocytes Absolute: 0.3 10*3/uL (ref 0.1–1.0)
Monocytes Relative: 5 % (ref 3–12)
Monocytes Relative: 3 % (ref 3–12)
Neutro Abs: 4.8 10*3/uL (ref 1.7–7.7)
Neutrophils Relative %: 82 % — ABNORMAL HIGH (ref 43–77)

## 2010-11-08 LAB — COMPREHENSIVE METABOLIC PANEL
Albumin: 3.3 g/dL — ABNORMAL LOW (ref 3.5–5.2)
BUN: 19 mg/dL (ref 6–23)
BUN: 20 mg/dL (ref 6–23)
CO2: 26 mEq/L (ref 19–32)
Calcium: 8.2 mg/dL — ABNORMAL LOW (ref 8.4–10.5)
Calcium: 8.7 mg/dL (ref 8.4–10.5)
Creatinine, Ser: 0.7 mg/dL (ref 0.4–1.2)
Glucose, Bld: 92 mg/dL (ref 70–99)
Potassium: 3.9 mEq/L (ref 3.5–5.1)
Sodium: 134 mEq/L — ABNORMAL LOW (ref 135–145)
Total Bilirubin: 0.4 mg/dL (ref 0.3–1.2)
Total Protein: 6.1 g/dL (ref 6.0–8.3)
Total Protein: 7.3 g/dL (ref 6.0–8.3)

## 2010-11-08 LAB — BASIC METABOLIC PANEL
BUN: 17 mg/dL (ref 6–23)
CO2: 24 mEq/L (ref 19–32)
Calcium: 7.6 mg/dL — ABNORMAL LOW (ref 8.4–10.5)
Creatinine, Ser: 0.68 mg/dL (ref 0.4–1.2)
Glucose, Bld: 95 mg/dL (ref 70–99)

## 2010-11-08 LAB — URINE CULTURE: Colony Count: >=100000

## 2010-11-08 LAB — URINALYSIS, ROUTINE W REFLEX MICROSCOPIC
Glucose, UA: NEGATIVE mg/dL
Hgb urine dipstick: NEGATIVE
Ketones, ur: NEGATIVE mg/dL
Nitrite: NEGATIVE
Protein, ur: NEGATIVE mg/dL
Specific Gravity, Urine: 1.016 (ref 1.005–1.030)
Urobilinogen, UA: 0.2 mg/dL (ref 0.0–1.0)
pH: 5.5 (ref 5.0–8.0)

## 2010-11-08 LAB — APTT: aPTT: 43 seconds — ABNORMAL HIGH (ref 24–37)

## 2010-11-08 LAB — POCT CARDIAC MARKERS
CKMB, poc: <1 ng/mL — ABNORMAL LOW (ref 1.0–8.0)
Myoglobin, poc: 46.3 ng/mL (ref 12–200)

## 2010-11-08 LAB — PROTIME-INR
INR: 1.2 (ref 0.00–1.49)
Prothrombin Time: 15.3 seconds — ABNORMAL HIGH (ref 11.6–15.2)

## 2010-11-08 LAB — URINE MICROSCOPIC-ADD ON

## 2010-11-08 LAB — CARDIAC PANEL(CRET KIN+CKTOT+MB+TROPI)
Relative Index: INVALID (ref 0.0–2.5)
Troponin I: 0.02 ng/mL (ref 0.00–0.06)

## 2010-11-10 ENCOUNTER — Ambulatory Visit (INDEPENDENT_AMBULATORY_CARE_PROVIDER_SITE_OTHER): Payer: Medicare Other | Admitting: Internal Medicine

## 2010-11-10 ENCOUNTER — Encounter: Payer: Self-pay | Admitting: Internal Medicine

## 2010-11-10 DIAGNOSIS — I4430 Unspecified atrioventricular block: Secondary | ICD-10-CM

## 2010-11-10 DIAGNOSIS — I442 Atrioventricular block, complete: Secondary | ICD-10-CM

## 2010-11-10 DIAGNOSIS — I495 Sick sinus syndrome: Secondary | ICD-10-CM

## 2010-11-10 NOTE — Assessment & Plan Note (Signed)
Heart rate controlled with PPM

## 2010-11-10 NOTE — Progress Notes (Signed)
The patient presents today for routine electrophysiology followup.  Since last being seen in our clinic, she reports doing very well.   She underwent PPM generator change by me 07/13/10 and has done well.  Today, she denies symptoms of palpitations, chest pain, shortness of breath, orthopnea, PND, lower extremity edema, dizziness, presyncope, syncope, or neurologic sequela.  The patient feels that she is tolerating medications without difficulties and is otherwise without complaint today.   Past Medical History  Diagnosis Date  . Complete heart block     s/p PPM by Dr Amil Amen 2004, recent Gen Change  07/13/10  . Small bowel obstruction   . Depression   . Hypertension   . Hypokalemia   . Recurrent UTI   . Gout   . GERD (gastroesophageal reflux disease)   . DVT (deep venous thrombosis)    Past Surgical History  Procedure Date  . Insert / replace / remove pacemaker     initial pacemaker by Dr Amil Amen 2004, replaced by [facility] for ERI 07/13/10    Current Outpatient Prescriptions  Medication Sig Dispense Refill  . acetaminophen (TYLENOL) 500 MG tablet Take 500 mg by mouth every 6 (six) hours as needed.        Marland Kitchen allopurinol (ZYLOPRIM) 100 MG tablet Take 100 mg by mouth daily.        Marland Kitchen ALPRAZolam (XANAX PO) Take by mouth as needed.        . citalopram (CELEXA) 10 MG tablet Take 10 mg by mouth daily.        Marland Kitchen diltiazem (DILACOR XR) 180 MG 24 hr capsule Take 180 mg by mouth daily.        . folic acid (FOLVITE) 1 MG tablet Take 1 mg by mouth daily.        Marland Kitchen levothyroxine (SYNTHROID) 112 MCG tablet Take 1 tablet (112 mcg total) by mouth daily.  30 tablet  11  . Oxybutynin Chloride (GELNIQUE TD) Place onto the skin daily.        . pantoprazole (PROTONIX) 40 MG tablet Take 40 mg by mouth daily.        Marland Kitchen POLYSACCHARIDE IRON (IRON POLYSACCHARIDES) 150 MG CAPS Take by mouth.        Marland Kitchen aspirin 81 MG tablet Take 81 mg by mouth daily.        . benazepril-hydrochlorthiazide (LOTENSIN HCT) 20-12.5 MG per  tablet Take 1 tablet by mouth daily.          Allergies  Allergen Reactions  . Aspirin   . Penicillins Rash    History   Social History  . Marital Status: Widowed    Spouse Name: N/A    Number of Children: N/A  . Years of Education: N/A   Occupational History  . Not on file.   Social History Main Topics  . Smoking status: Former Smoker    Quit date: 07/24/1980  . Smokeless tobacco: Not on file  . Alcohol Use: No  . Drug Use: No  . Sexually Active: Not on file   Other Topics Concern  . Not on file   Social History Narrative  . No narrative on file   Physical Exam: Filed Vitals:   11/10/10 1450  BP: 129/78  Pulse: 89  Height: 5\' 1"  (1.549 m)  Weight: 146 lb (66.225 kg)    GEN- The patient is well appearing, alert and oriented x 3 today.   Head- normocephalic, atraumatic Eyes-  Sclera clear, conjunctiva pink Ears- hearing intact Oropharynx- clear  Neck- supple, no JVP Lymph- no cervical lymphadenopathy Lungs- Clear to ausculation bilaterally, normal work of breathing Chest- pacemaker pocket is well healed Heart- Regular rate and rhythm, no murmurs, rubs or gallops, PMI not laterally displaced GI- soft, NT, ND, + BS Extremities- no clubbing, cyanosis, or edema MS- no significant deformity or atrophy Skin- no rash or lesion Psych- euthymic mood, full affect Neuro- strength and sensation are intact  Assessment and Plan:

## 2010-11-10 NOTE — Assessment & Plan Note (Signed)
Normal pacemaker function See Pace Art report No changes today  

## 2010-12-06 NOTE — Discharge Summary (Signed)
[name], [name]              ACCOUNT NO.:  [id number]   MEDICAL RECORD NO.:  [id number]          PATIENT TYPE:  INP   LOCATION:  2033                         FACILITY:  [facility]   PHYSICIAN:  Vesta Mixer, M.D. DATE OF BIRTH:  18-May-1917   DATE OF ADMISSION:  09/08/2008  DATE OF DISCHARGE:  09/12/2008                               DISCHARGE SUMMARY   DISCHARGE DIAGNOSES:  1. Chest pain - noncardiac.  2. Fever - presumed community-acquired pneumonia.  3. Gout.  4. Anxiety/depression.  5. Hypothyroidism.  6. Sick sinus syndrome - status post pacemaker implantation.  7. Urinary tract infection - Proteus mirabilis sensitive to      ciprofloxacin.   DISCHARGE MEDICATIONS:  1. Hydralazine 25 mg twice a day.  2. Cardizem CD 180 mg a day.  3. Synthroid 0.125 mg a day.  4. Zoloft 100 mg a day.  5. Aspirin 325 mg a day.  6. Ciprofloxacin 250 mg twice a day for 5 days.  7. Xanax 0.25 mg twice a day as needed.  8. Tylenol 650 mg 3 times a day as needed for her pacemaker      management.   DISPOSITION:  The patient will follow up with Dr. Elease Hashimoto for further  cardiac problems and her pacemaker.  She will follow up with [facility] for further medical problems.   HISTORY:  [name] is an 77 year old female who was admitted to my  service for episodes of chest pain.  She originally presented to the Med  Center at [facility] Emergency Room.  She was transferred to [facility]  and was admitted to my service for further evaluation.  Please see  dictated H and P for further details.   HOSPITAL COURSE:  1. Chest pain.  The patient was admitted with a very atypical chest      pain.  She ruled out for myocardial infarction and did not have any      further problems.  She will follow up with Dr. Elease Hashimoto.  Given her      overall poor condition and her advanced age, I do not think that      she is a good candidate for any invasive testing.  2. Fever.  The patient was  said to be discharged several days ago, but      she developed a fever of 102.  Cultures were obtained.  She had      some infiltrates on her x-ray and was started on Avelox 400 mg a      day.  Urinalysis and urine culture were also drawn.  Her urine      culture end up growing Proteus mirabilis, which was very sensitive.      Sensitive to amoxicillin, ciprofloxacin, cefazolin, gentamicin, and      levofloxacin.  She will be discharged on ciprofloxacin for presumed      urinary tract infection.  We will give her 250 mg twice a day.  3. Hypothyroidism - stable.  4. Hypertension - stable.  Her dose of hydralazine was listed 50 mg  four times a day.  Here in the hospital, she has gone on 25 mg      twice a day and her blood pressures have been very well controlled.      I think that her actual dose should be 25 mg twice a day.  5. History of gout.  The patient complained of some gouty pain in her      shoulder.  She is not able to take colchicine because of diarrhea.      We have given her Tylenol as needed.  She will follow up with      general medical doctor for that.  All other medical problems are      stable.      Vesta Mixer, M.D.  Electronically Signed     PJN/MEDQ  D:  09/12/2008  T:  09/12/2008  Job:  [id number]   cc:   [facility]

## 2010-12-06 NOTE — H&P (Signed)
[name], [name]              ACCOUNT NO.:  [id number]   MEDICAL RECORD NO.:  [id number]          PATIENT TYPE:  INP   LOCATION:  2033                         FACILITY:  [facility]   PHYSICIAN:  Elmore Guise., M.D.DATE OF BIRTH:  Mar 04, 1917   DATE OF ADMISSION:  09/08/2008  DATE OF DISCHARGE:                              HISTORY & PHYSICAL   INDICATION FOR ADMISSION:  Chest pain.   PRIMARY CARDIOLOGIST:  Dr. Kristeen Miss.   HISTORY OF PRESENT ILLNESS:  [name] is a very pleasant 77-year-  old white female with past medical history of hypertension, sick sinus  syndrome (status post dual-chamber permanent pacemaker implant),  hypothyroidism, and gout who presented to outside emergency room with  chest discomfort.  The patient reports normal state of health over the  last couple of weeks.  She denies any problems until she awoke early  this morning with right-sided chest pain.  She stated she had a sharp  pain at the mid part of her right chest.  This would come and go.  This  started radiating to her left side up to her neck and then down to her  epigastric region.  It continued to be sharp in nature, worse with deep  inspiration and associated with some shortness of breath.  She denies  any nausea or diaphoresis.  Her symptoms continue.  She took an aspirin  with minimal improvement.  She had a brief spell where her symptoms  resolved.  However, when her symptoms returned, she became shaky.  She  then stated I have felt panicky. She went to the outside emergency  room for evaluation.  There she was given morphine and nitroglycerin  with relief.  Currently, she is resting comfortably and family is at the  bedside.  She denies any recent fever or cough.  No nausea, vomiting, or  diarrhea.  She does report recent problems when she took too much  colchicine and had loose stools.  However, this has now resolved.  She  still is very active and does live independently.  She  denies any lower  extremity edema.  No orthopnea or PND.  She has been wearing compression  hose for the last month to help her with some occasional swelling.  She  is ambulatory without assistance.  She does report sometimes I lose my  balance.  Otherwise review of systems are as per HPI.  All others are  negative.   CURRENT MEDICATIONS:  1. Diltiazem 180 mg daily.  2. Hydralazine 50 mg questionable whether she is taking this 4 times      daily.  3. Levoxyl 125 mcg daily.  4. Sertraline 100 mg daily.  5. Aspirin.  6. Xanax.   ALLERGIES:  PENICILLIN.   FAMILY HISTORY:  Positive for hypertension.   SOCIAL HISTORY:  She lives independently.  Her family does live close  by.  She denies any tobacco or alcohol use.   EXAM:  VITAL SIGNS:  She is afebrile.  Temperature is 98.0, blood  pressure is 110-140 over 50-60, heart rates in the 70s showing  ventricular paced  rhythm.  She is saturating 98% on room air.  GENERAL:  She is a very pleasant elderly white female alert and oriented  x4 in no acute distress.  She does have a fine resting tremor noted.  SKIN:  Warm and dry without rashes.  HEENT:  Unremarkable.  NECK:  Supple, no lymphadenopathy, no JVD, no bruits.  LUNGS:  Clear.  HEART:  Regular with 2/6 soft flow murmur noted.  ABDOMEN:  Soft, nontender, nondistended, no rebound or guarding.  EXTREMITIES:  Warm with 1+ pedal pulses and no edema.  Strength is equal  in upper extremities.  No focal deficits noted.   DIAGNOSTIC STUDIES:  Her blood work shows a myoglobin of 46 and 43 and  MB of less than 1.0 x2, and troponin I less than 0.05 x2.  The UA is  negative.  White blood cell count of 6.0 with a hemoglobin 11.7,  platelet count 187,000.  Coags show a PT/INR of 15.3 and 1.2 with a PTT  of 43, BUN and creatinine of 20 of 0.7, potassium level of 3.9, glucose  is 88.  LFTs are normal except for an albumin level of 3.3 which is  mildly low.  Chest x-ray shows mild cardiomegaly but  no CHF.  No  infiltrates noted.  Dual-chamber permanent pacemaker leads are shown in  appropriate positions.  ECG shows tremor artifact with V paced rhythm at  74 per minute.   IMPRESSION:  1. Chest discomfort (atypical).  2. Hypertension.  3. Hypothyroidism.  4. History of sick sinus syndrome status post permanent pacemaker      implant.  5. History of anxiety.   PLAN:  At this time the patient will be admitted to the hospital.  Her  initial enzymes are negative and her symptoms are somewhat atypical.  She will have 1 more set of cardiac markers done tonight and another set  done tomorrow morning.  She will receive DVT-dose Lovenox.  We will  continue her home medications at this time.  She she will be placed on  aspirin 325 mg daily as well as a PPI to help with stress-dose ulcers.  She will have an echocardiogram done in the morning as well as a TSH and  repeat cardiac markers.  Currently she is chest pain free and  hemodynamically stable.  Further measures per Dr. Kristeen Miss.      Elmore Guise., M.D.  Electronically Signed     TWK/MEDQ  D:  09/08/2008  T:  09/08/2008  Job:  [id number]

## 2010-12-09 NOTE — Consult Note (Signed)
[name], LIZAMA [facility]                  ACCOUNT NO.:  [id number]   MEDICAL RECORD NO.:  [id number]                   PATIENT TYPE:  INP   LOCATION:  1825                                 FACILITY:  [facility]   PHYSICIAN:  Cassell Clement, M.D.              DATE OF BIRTH:  September 11, 1916   DATE OF CONSULTATION:  DATE OF DISCHARGE:                                   CONSULTATION   CHIEF COMPLAINT:  Slow pulse and high blood pressure.   HISTORY:  This is an 77 year old widowed Caucasian female admitted with  marked bradycardia with heart rate of around 40.  An electrocardiogram shows  normal sinus rhythm with high-grade AV block.  She does not have any history  of chest pain to suggest acute myocardial infarction, and she does not have  any history of known diabetes, thyroid problems, or high cholesterol.  She  has had a long history of high blood pressure.  Her home medications include  Atacand 32 mg daily, Synthroid 0.137 mg daily, Microzide 12.5 mg daily,  Zoloft 100 mg one-half tab daily, Ditropan XL 10 mg daily, Xanax 0.5 mg  p.r.n., Estrace vaginal cream p.r.n., and aspirin 81 mg daily.   The patient has a history of depression and of hypothyroidism which has been  treated.  She has a past history of mild azotemia and a history of having  had pneumonia about 6 weeks ago.  About 4 weeks ago, she began feeling  weak.  She attributed this to having been sick with a pneumonia.  She has  had several spells of near syncope day before yesterday.  She was seen  yesterday by Dr. Benedetto Goad, and it was noted that she had a very slow  pulse and consult was scheduled for our office for April 19; however, this  morning when she took her blood pressure on her home machine, it registered  greater than 200, and she became concerned, and she was seen again at  [facility] where they noticed her marked bradycardia and referred to [facility]  ER for admission.  Her pulse at that time was in the high 30s.   She has not  had any nausea, vomiting, or diaphoresis.  She denies chest pain.   FAMILY HISTORY AND SOCIAL HISTORY:  Are as noted on previous notes.   REVIEW OF SYSTEMS:  Negative except for as noted above.   PHYSICAL EXAMINATION:  Blood pressure initially 216/90, now 146/90.  Pulse  is 43, and she is in essentially complete heart block with occasional  capture beat.  Respirations are normal.  The skin is warm and dry.  She  denies chest pain.  Head and neck exam unremarkable.  Carotids reveal  bilateral bruits transmitted from the heart.  The chest reveals a few  bilateral rales.  The heart reveals grade 2/6 systolic ejection murmur at  the base.  There is no S3.  There is no diastolic murmur.  There is  no rub.  The abdomen and without hepatosplenomegaly or masses.  Extremities show good  pulses and no edema.   LABORATORY DATA:  Chest x-ray shows cardiomegaly with left ventricular  enlargement.  EKG shows normal sinus rhythm with a high-grade AV block.  Her  potassium is 3.4, CK-MB 2.4, troponin-I 0.02.   IMPRESSION:  1. High-grade arteriovenous block without evidence of reversible etiology.     Her thyroid hormone levels are pending, and her potassium is borderline     now at 3.4.  2. Hypertensive cardiovascular disease.  3. History of depression.  4. History of hypothyroidism.   RECOMMENDATIONS:  The patient will almost certainly need to have a permanent  dual-chamber pacemaker inserted.  We will try to get this scheduled as soon  as possible, possibly Friday, November 07, 2002.  We will hold her breakfast in  the morning pending review of the surgical schedule.  We will hold her  Lovenox in the morning, will check clotting parameters tonight.  We will get  her KCl 20 mEq and recheck a BMET in the morning, and we are holding her  diuretic.  Will follow with you.                                                Cassell Clement, M.D.    TB/MEDQ  D:  11/06/2002  T:  11/06/2002   Job:  [id number]   cc:   Rosanne Sack, M.D.  [address]  [place], [place] [address]  Fax: [phone]   Gloriajean Dell. Andrey Campanile, M.D.  [address]  [place]  [place] [address]  Fax: [phone]

## 2010-12-09 NOTE — Discharge Summary (Signed)
   [name], [name] [facility]                  ACCOUNT NO.:  [id number]   MEDICAL RECORD NO.:  [id number]                   PATIENT TYPE:  INP   LOCATION:  2904                                 FACILITY:  [facility]   PHYSICIAN:  Vesta Mixer, M.D.              DATE OF BIRTH:  [birth year]   DATE OF ADMISSION:  11/06/2002  DATE OF DISCHARGE:  11/08/2002                                 DISCHARGE SUMMARY   DISCHARGE DIAGNOSES:  1. Complete AV block, status post pacemaker implantation.  2. Hypertension.  3. Hypothyroidism.  4. History of depression.   DISCHARGE MEDICATIONS:  1. Darvocet-N 100 as needed for pain.  2. Atacand 32 mg daily.  3. Hydralazine 50 mg q.i.d.  4. Norvasc 5 mg daily.  5. Synthroid 0.137 mg daily.  6. Hydrochlorothiazide 25 mg daily.  7. Zoloft 50 mg daily.  8. Ditropan XL 10 mg daily.  9. Estrace vaginal cream p.r.n.   DISPOSITION:  1. The patient is to see Dr. Benedetto Goad for followup of her medical     problems.  2. She is to see Dr. Elease Hashimoto for a pacemaker check in approximately one week.   HISTORY OF PRESENT ILLNESS:  The patient is an elderly female with a history  of bradycardia and high degree AV block.  She was admitted for further  evaluation.  Please see dictated H&P for further evaluation.   HOSPITAL COURSE:  #1 -  BRADYCARDIA:  The patient was noted to have a  complete AV block.  She underwent successful AV pacemaker by Dr. Corliss Marcus.  She had no complications.  A chest x-ray the following morning was  unremarkable.  The patient has 100% capture at her current levels.  She will  be followed up by Dr. Elease Hashimoto in the office.  #2 -  HYPERTENSION:  The patient's blood pressure remained elevated during  the hospitalization.  We will add  Norvasc 5 mg a day to her medications.  She will be followed up in the  office by Dr. Elease Hashimoto.  #3 -  HYPOTHYROIDISM:  Stable.  #4 -  HYPOKALEMIA:  We will have her eat a banana once a day.                                    Vesta Mixer, M.D.    PJN/MEDQ  D:  11/08/2002  T:  11/09/2002  Job:  [id number]   cc:   Gloriajean Dell. Andrey Campanile, M.D.  [address]  [place]  [place] [address]  Fax: [phone]   Francisca December, M.D.  [address]  [place]  [place] [address]  Fax: [phone]

## 2010-12-09 NOTE — Op Note (Signed)
   NAMEKYLEIGHA, MARKERT [facility]                  ACCOUNT NO.:  [id number]   MEDICAL RECORD NO.:  [id number]                   PATIENT TYPE:  AMB   LOCATION:  ENDO                                 FACILITY:  [facility]   PHYSICIAN:  Anselmo Rod, M.D.               DATE OF BIRTH:  1917-05-04   DATE OF PROCEDURE:  05/21/2002  DATE OF DISCHARGE:                                 OPERATIVE REPORT   PROCEDURE PERFORMED:  Colonoscopy with snare polypectomy times one.   ENDOSCOPIST:  Charna Elizabeth, M.D.   INSTRUMENT USED:  Olympus video colonoscope (adjustable pediatric scope).   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  Rule out recurrent colonic polyps.   PREPROCEDURE PREPARATION:  Informed consent was procured from the patient.  The patient was fasted for eight hours prior to the procedure and prepped  with a bottle of magnesium citrate and a gallon of NuLytely the night prior  to the procedure.   PREPROCEDURE PHYSICAL:  The patient had stable vital signs.  Neck supple.  Chest clear to auscultation.  S1 and S2 regular.  Abdomen soft with normal  bowel sounds.   DESCRIPTION OF PROCEDURE:  The patient was placed in left lateral decubitus  position and sedated with 50 mg of Demerol and 5 mg of Versed intravenously.  Once the patient was adequately sedated and maintained on low flow oxygen  and continuous cardiac monitoring, the Olympus video colonoscope was  advanced from the rectum to the distal small bowel without difficulty.  A  healthy anastomosis was noted at 90 cm.  There was evidence of left-sided  diverticulosis.  A small sessile polyp was snared from 25 cm.  Small  internal and external hemorrhoids were seen.  The patient tolerated the  procedure well without complications.   IMPRESSION:  1. Small nonbleeding internal and  external hemorrhoid.  2. Left-sided diverticulosis.  3. Small sessile polyp snared at 25 cm.  4. Healthy anastomosis at 90 cm.   RECOMMENDATIONS:  1. Await pathology results.  2.     Avoid all nonsteroidals including aspirin for the next three to four weeks.   3. High fiber diet with liberal fluid intake.  4. Outpatient follow-up within the next two weeks, earlier if need be.                                                   Anselmo Rod, M.D.    JNM/MEDQ  D:  05/21/2002  T:  05/21/2002  Job:  [id number]   cc:   Gloriajean Dell. Andrey Campanile, M.D.   Adolph Pollack, M.D.  Fax: [phone]

## 2010-12-09 NOTE — H&P (Signed)
[name], LUGAR [facility]                  ACCOUNT NO.:  [id number]   MEDICAL RECORD NO.:  [id number]                   PATIENT TYPE:  INP   LOCATION:  1825                                 FACILITY:  [facility]   PHYSICIAN:  Everett Graff, N.P.               DATE OF BIRTH:  1917-06-06   DATE OF ADMISSION:  11/06/2002  DATE OF DISCHARGE:                                HISTORY & PHYSICAL   CHIEF COMPLAINT:  Increased weakness.   PROBLEM LIST:  1. Symptomatic bradycardia/sick sinus syndrome with heart rate into the     30's.  Complaints of generalized weakness and presyncope.     A. Previous cardiologist Dr. Elease Hashimoto.  2. Uncontrolled hypertension.  3. Mild prerenal azotemia and dehydration.  4. Recent history of acute bronchitis 3/04, treated with a course of     Azithromycin.  5. Known hypothyroidism reported normal TSH 12/03.  6. History tubulovillous adenoma status post right colectomy.     A. Followup colonoscopy 04/24/02 noted left diverticulosis, internal and        external hemorrhoid,  hyperplastic polyp.  7. Depression.  8. History of tobacco use 1-2 packs per day for 40 years, quit in 1979.  9. Code status currently Full Code.   ALLERGIES:  PENICILLIN.   HISTORY OF PRESENT ILLNESS:  This 77 year old female who is followed by  primary care by Dr. Andrey Campanile of [facility].  She has been  complaining of increased weakness for approximately one week with a  presyncopal episode.  She came to [facility]'s office today  for evaluation and found to be significantly bradycardic with a heart rate  in the 30 and 40's.  She was sent to [facility] Emergency Room where a 12-  Lead electrocardiogram did confirm  bradycardia, right bundle branch block,  left anterior fascicular block with a rate of 37.  She is admitted for  further evaluation and treatment, likely internal pacemaker placement.   FAMILY HISTORY:  Noncontributory.   SOCIAL HISTORY:  The  patient is widowed and lives alone independently.  She  has a previous tobacco smoking history, one to two packs daily for  approximately 40 years, quit in 1979.  No alcohol use.   REVIEW OF SYSTEMS:  HEENT:  No visual or hearing changes.  Denies trauma to  the head.  LUNGS:  No shortness of breath, but weakness with exertion.  HEART:  No complaints of chest pain or of palpitation.  ABDOMEN:  Denies  nausea and vomiting.  Denies abdominal pain.  URINARY/GENITAL:  Denies any  symptomatology consistent with urinary tract infection.  MUSCULOSKELETAL:  No significant complaints of myalgias or arthralgias.  NEUROLOGIC:  A  presyncopal episode within the last week.  Denies any focal neurologic  changes.  Complains of generalized weakness, but nothing specific.  INTEGUMENTARY:  No ulcers or suspicious lesions reported.   MEDICATIONS AT TIME OF ADMISSION:  1. Atacand 32 mg daily.  2. Synthroid 0.137 mg daily.  3. Microzide 12.5 mg daily.  4. Zoloft 100 mg 1/2 tablet equalling 50 mg daily.  5. Ditropan XL 10 mg daily.  6. Xanax 0.5 mg p.r.n.  7. Estrace 0.1% vaginal cream.  8. Aspirin 81 mg daily.   PHYSICAL EXAMINATION:  VITAL SIGNS:  Temperature 97.1, pulse 38,  respirations 16, blood pressure 134/94.  O2 sat is 92%.  GENERAL:  Well-developed elderly female in no acute distress.  HEENT:  Head normocephalic.  Eyes; PERRLA.  Ears; canals clear.  Hearing  grossly normal.  Nose; patent without masses or discharge noted.  Oral  mucosa pink, though somewhat dry.  NECK:  No jugular venous distention or bruits noted.  HEART:  Bradycardic with no murmur, S3, S4 appreciated.  Occasional  irregular beats.  LUNGS:  Breath sounds are clear and equal.  ABDOMEN:  Soft and round with positive bowel sounds.  No pain with  palpation.  No masses appreciated.  URINARY/GENITAL:  No bladder pain or CVA tenderness.  GENITALIA:  Exam deferred as noncontributory.  RECTAL:  Exam deferred as noncontributory.   MUSCULOSKELETAL:  Range of motion all four extremities is full.  Strength is  strong and equal all four extremities.  NEUROLOGIC:  Cranial nerves II-XII grossly intact.  No unilateral of focal  changes.  INTEGUMENTARY:  Skin warm and dry.  Some skin tenting observed suggesting a  degree of dehydration.  No suspicious ulcers or lesions noted during the  course of the exam.   LABORATORY DATA:  Portable chest x-ray indicates no active disease,  increased left ventricular size with cardiomegaly and increased aorta size.  CBC found WBC 7.3, hemoglobin 13.2, hematocrit 40.3, platelets 201.  Differential was essentially unremarkable.  Other labs pending (see  admission orders.   IMPRESSIONS AND PLANS:  1. Symptomatic sick sinus syndrome with pulse into the 30's and presyncope.     Rule out acute myocardial infarction.  Admit to CCU with telemetry.     External pacer for rate support.  Cardiac consult Dr. Elease Hashimoto.  Likely     internal pacemaker implant.  We will rule out myocardial infarction with     times one set cardiac enzymes.  2. Uncontrolled hypertension.  Follow blood pressures closely and start     hydralazine for blood pressure control and hopefully some increase pulse     stimulation.  3. Mild azotemia/dehydration.  Hold diuretics.  Gently hydrate with     intravenous fluids.  Follow up basic metabolic panel in a.m.  4. Recent acute bronchitis status post a course of antibiotic therapy with     Azithromycin.  Chest x-ray shows no active disease.  5. Hypothyroidism.  Reported normal TSH 12/03.  We will recheck a serum TSH.  6. Anxiety disorder/depression.  Benzodiazepines p.r.n.  Would continue     antidepressant with Zoloft at previous dose.   DISPOSITION:  CCU bed on telemetry service Dr. Nolon Rod [place], [phone].   ACTIVITY:  Bedrest currently.    DIET:  Regular consistency, no added salt.   CONDITION:  Guarded.                                               Everett Graff, N.P.    TC/MEDQ  D:  11/06/2002  T:  11/06/2002  Job:  [id number]

## 2010-12-09 NOTE — Op Note (Signed)
[name], [name]              ACCOUNT NO.:  [id number]   MEDICAL RECORD NO.:  [id number]          PATIENT TYPE:  AMB   LOCATION:  ENDO                         FACILITY:  [facility]   PHYSICIAN:  Anselmo Rod, M.D.  DATE OF BIRTH:  [birth year]   DATE OF PROCEDURE:  03/24/2005  DATE OF DISCHARGE:                                 OPERATIVE REPORT   PROCEDURE PERFORMED:  Esophagogastroduodenoscopy with biopsies.   ENDOSCOPIST:  Anselmo Rod, M.D.   INSTRUMENT USED:  Olympus video panendoscope.   INDICATION FOR PROCEDURE:  [age]-year-old white female with a history  of black stools, undergoing EGD to rule out peptic ulcer disease,  esophagitis, gastritis, etc.   PREPROCEDURE PREPARATION:  Informed consent was procured from the patient.  The patient was fasted for 8 hours prior to the procedure.   PREPROCEDURE PHYSICAL:  VITAL SIGNS:  The patient had stable vital signs.  NECK:  Supple.  CHEST:  Clear to auscultation.  CARDIAC:  S1 and S2 regular.  ABDOMEN:  Soft with normal bowel sounds.   DESCRIPTION OF THE PROCEDURE:  The patient was placed in the left lateral  decubitus position and sedated with 50 mg of Demerol and 5 mg of Versed in  slow incremental doses.  Once the patient was adequate sedated and  maintained on low-flow oxygen and continuous cardiac monitoring, the Olympus  video panendoscope was advanced through the mouthpiece, over the tongue and  into the esophagus under direct vision.  The entire esophagus was widely  patent with no evidence or ring, stricture, masses, esophagitis or Barrett's  mucosa.  The scope was then advanced into the stomach; diffuse gastritis was  noted.  Antral biopsies were done to rule out presence of H. pylori by  Pathology.  Retroflexion in the high cardiac revealed no abnormalities.  There was no evidence of a hiatal hernia.  The proximal small bowel had a  peppered appearance with blackish pigmentation of unclear significance;  biopsies were done to confirm pathology.  A large AVM was noted in the  postbulbar area; this was not ablated, as there was no evidence of bleeding.  The patient tolerated the procedure well without immediate complications.   IMPRESSION:  1.  Normal esophagus.  2.  Diffuse gastritis, antral biopsies done for H. pylori for Pathology.  3.  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  4.  Blackish pigmentation of small bowel mucosa, biopsies done, results      pending.   RECOMMENDATIONS:  1.  Avoid nonsteroidals for now.  2.  Await pathology results.  3.  Treat with antibiotics if H. pylori present on biopsies.  4.  Proceed with a colonoscopy at this time.  Further recommendation will be      made thereafter.      Anselmo Rod, M.D.  Electronically Signed     JNM/MEDQ  D:  03/24/2005  T:  03/24/2005  Job:  [id number]   cc:   Gloriajean Dell. Andrey Campanile, M.D.  [address]  [place]  [place] [address]  Fax: [phone]   Adolph Pollack, M.D.  [phone]  [address]  [place]  [place] [address]

## 2010-12-09 NOTE — Discharge Summary (Signed)
[name], [name] [facility]                  ACCOUNT NO.:  [id number]   MEDICAL RECORD NO.:  [id number]                   PATIENT TYPE:  INP   LOCATION:  2904                                 FACILITY:  [facility]   PHYSICIAN:  Rosanne Sack, M.D.         DATE OF BIRTH:  04-25-1917   DATE OF ADMISSION:  11/06/2002  DATE OF DISCHARGE:  11/08/2002                                 DISCHARGE SUMMARY   CHIEF COMPLAINT:  Increased weakness.   DISCHARGE DIAGNOSES:  1. High-grade arterioventricular block with presenting symptomatic     bradycardia.     A. Status post cardiology consult, Dr. Cassell Clement.     B. Pacemaker implant, November 07, 2002, dual chamber PTVP, Dr. Francisca December.     C. Next day pacemaker check, November 08, 2002, all values within normal        limits.  2. Uncontrolled hypertension.  At discharge blood pressures improved in     control with medication adjustment, but will need to be followed post     discharge (see medication list below).  3. Dehydration, resolved over the course of hospitalization with IV     hydration.  4. Hypothyroidism, stable.  5. History of tubulovillous adenoma, status post right colectomy.     A. Followup colonoscopy in October 2003 noted left diverticulosis with        internal and external hemorrhoids.  Several hyperplastic polyps.  6. History of depression.  7. History of 1- to 2-pack-per-day tobacco smoker for approximately 40     years, quit in 1979.  8. Allergies Listed:  Penicillin.  9. Code Status:  Was maintained full code over the course of     hospitalization.   DISCHARGE MEDICATIONS:  1. Darvocet-N 100 one tablet as needed for pain, usual frequency, every 4     hours.  2. Atacand 32 mg daily.  3. Hydralazine 50 mg four times daily.  4. Norvasc 5 mg daily.  5. Synthroid 0.137 mg daily.  6. Hydrochlorothiazide 25 mg daily.  7. Zoloft 50 mg daily.  8. Ditropan XL 10 mg daily.  9. Estrace 0.1% vaginal cream as  directed.   SPECIAL DISCHARGE INSTRUCTIONS:  1. Discharge diet of low sodium.  2. Limit activity with right arm for 1 week.  No heavy lifting.  3. Watch for any bleeding from the pacemaker insertion site.  Wound to be     kept dry until November 11, 2002.  4. Followup with Dr. Vesta Mixer, cardiology, [phone], for pacemaker     check 1 week post discharge.  5. The patient should also follow up post discharge with [facility], Dr. Andrey Campanile, in 1 to 2 weeks for reevaluation of hypertension.   CONSULTATIONS OVER THE COURSE OF HOSPITALIZATION:  Cassell Clement, M.D.  and Francisca December, M.D., cardiology.   PROCEDURES OVER THE COURSE OF HOSPITALIZATION:  1. Pacemaker implant, November 07, 2002.  2. A 24-hour post pacemaker check, November 08, 2002, unremarkable.   HISTORY OF PRESENT ILLNESS:  For dictated history of present illness, family  history, social history, review of systems, admission medications, physical  exam, laboratory data, impressions, and plan, please see dictated admission  history and physical from November 06, 2002.   Problem #1:  Symptomatic bradycardia/sick sinus syndrome with cardiology  indicating this was a high-grade AV block.  This 77 year old female is  followed in primary care by Dr. Andrey Campanile, [facility].  She  complained of increased weakness for approximately 1 week with a presyncope  episode.  She came to [facility] office, and was found to  be significantly bradycardic with a heart rate into the 30 to 40 range.  She  came to the emergency room at [facility] [place] where a 12-lead EKG confirmed  bradycardia with right bundle-branch block, left anterior fascicular block  with a rate of 37.  She was admitted for further evaluation and treatment.  Seen by Dr. Patty Sermons, cardiology.  He felt the patient had a high-grade AV  block.  Dr. Amil Amen of cardiology proceeded with pacemaker implant.  This  was a dual-chamber  PTVP.  A 24-hour post placement pacemaker check found all  values within normal limits.  Discharge instructions from cardiology  requiring activity restrictions and wound care instructions as above.  The  patient was also requested to follow up with Dr. Elease Hashimoto of cardiology post  discharge in 1 week for repeat pacemaker check.  All indications are that  the patient tolerated the procedure well.   Problem #2:  Uncontrolled hypertension.  Systolic blood pressures quite  elevated in the 175 to 185 range.  Hydralazine was initiated for blood  pressure control with some improvement, though medication dosage may need to  be adjusted post discharge.  The patient is requested to follow up with  [facility] post discharge in 1 to 2 weeks with repeat blood  pressure check at that time.  If indicated, her medications should be  further adjusted.   Problem #3:  Dehydration.  The patient's BUN and creatinine were somewhat  elevated above baseline, 23 and 0.8.  Also mildly hypokalemic with a  potassium of 3.4.  She was given some IV fluids and BUN and creatinine  normalized to presumed baseline of 17 and 0.8, respectively.  Her potassium  normalized with some replacement.  I would follow up a basic metabolic panel  post discharge.   Problem #4:  Hypothyroidism.  The patient has a known history of  hypothyroidism on Synthroid replacement.  A TSH was checked and this was  normal at 2.718.   DISCHARGE LABORATORIES:  A CBC on November 06, 2002, found a WBC 7.3,  hemoglobin 13.2, hematocrit 40.3, platelets 201.  Differential was  unremarkable.  Coagulation studies showed a PT of 14.1, INR 1.1, PTT 33.  Discharge metabolic panel showed a sodium 140, potassium 3.7, chloride 109,  CO2 of 24, BUN 17, and creatinine 0.8.  Cardiac enzymes were checked x1 and  they were unremarkable.  TSH 2.718.  Chest x-ray on November 08, 2002, noted pacer in satisfactory position without a pneumothorax.  There was  mild  edema.   DISPOSITION:  The patient returning home where she lives independently.  Activity restrictions status post pacemaker placement as indicated above.    CONDITION AT TIME OF DISCHARGE:  Stable/improved status post pacemaker  implant.   DISCHARGE PROCESS:  Greater than 30  minutes, 10:15 a.m. through 10:45 a.m.     Everett Graff, N.P.                     Rosanne Sack, M.D.    TC/MEDQ  D:  11/12/2002  T:  11/13/2002  Job:  [id number]   cc:   Vesta Mixer, M.D.  [address]  [place]  [place] [address]  Fax: [phone]   [facility], Dr. Andrey Campanile

## 2010-12-09 NOTE — Op Note (Signed)
[name], [name] [facility]                  ACCOUNT NO.:  [id number]   MEDICAL RECORD NO.:  [id number]                   PATIENT TYPE:  INP   LOCATION:  2904                                 FACILITY:  [facility]   PHYSICIAN:  Francisca December, M.D.               DATE OF BIRTH:  08-Dec-1916   DATE OF PROCEDURE:  11/07/2002  DATE OF DISCHARGE:                                 OPERATIVE REPORT   PROCEDURES PERFORMED:  1. Insertion dual chamber permanent transvenous pacemaker.  2. Left subclavian venogram.   INDICATIONS FOR PROCEDURE:  The patient is an 77 year old woman who  presented to [facility] with  near syncope, progressive fatigue and  weakness as well as marked bradycardia. An electrocardiogram revealed the  presence of complete AV block with an underlying escape rhythm of 40 beats  per minute. Her atrial rate was 85 beats per minute. The patient is brought  now  to the cardiac catheterization laboratory for insertion of dual chamber  pacemaker.   DESCRIPTION OF PROCEDURE:  The patient was brought  to the cardiac  catheterization laboratory in a fasting state. The left prepectoral region  was prepped and draped in the usual sterile fashion. Local anesthesia was  obtained with the infiltration of 1% Lidocaine with epinephrine throughout  the left prepectoral region.   A 6 to 7-cm incision was made it deltopectoral groove and this was carried  down by sharp dissection and electrocautery to the prepectoral fascia. There  a plane was lifted and a pocket formed inferiorly and medially  utilizing  electrocautery and blunt dissection. The pocket was then packed with a 1%  acetomycin soaked gauze.   Previously a left subclavian venogram had been performed utilizing a  peripheral injection of 20 cc of Omnipaque. A digital cine angiogram was  obtained and roadmapped to guide future left subclavian puncture. The left  subclavian venogram did demonstrate the vein to be widely  patent and  coursing in a normal fashion over the anterior surface of the first rib and  beneath the middle third of  the clavicle. There was no evidence for  persistence of the left superior vena cava.   Two separate left subclavian punctures were then performed using an 18 gauge  thin-wall needle through which was passed a 0.038 inch type J guide wire.  Over the initial guide wire a 7 French tear-away sheath and dilator was  advanced. The dilator and wire were removed. The ventricular lead was  advanced to the level of the right atrium under fluoroscopic observation.  The sheath was torn away.   Using fluoroscopic observation and standard technique, the lead was  manipulated into the right atrial appendage. There ex-pacing parameters were  obtained as will be noted below. The lead was tested for diaphragmatic  pacing at 10 volts and none was found. The lead was then sutured into place  using 3 separate 0 silk ligatures.   Over the  remaining guide wire a 9 French tear-away sheath and dilator were  advanced. The dilator was removed. The wire was allowed to remain in place.  The atrial lead was advanced to the level of  the right atrium. The sheath  was torn away.   Again using standard technique and fluoroscopic landmarks, the lead was  manipulated into the right atrial appendage. There excellent pacing  parameters were obtained as will be noted below. The lead was tested for  diaphragmatic pacing at 10 volts and none was found. The lead was then  sutured into place using 3 separate 0 silk ligatures. The lead was inspected  for bleeding and a hemostasis figure-of-8 suture was required around the  insertion of  the lead at the pectoral muscle level. Following placement of  this there was excellent hemostasis.  The pocket was then copiously  irrigated using 1% kanamycin solution following removal of the kanamycin  soaked gauze.   The leads were then attached to the pacing generator,  carefully identifying  each by its serial number and placing each into the appropriate receptacle.  Each was tightened into place carefully. The leads were then wound beneath  the pacing generator and the generator was placed in the pocket.   The pocket was then closed using 2-0 Dexon in a running fashion for the  subcutaneous layer. The skin was approximated using 5-0 Dexon in a running  subcuticular fashion. Steri-Strips and a sterile dressing were applied and  the patient was transported to the recovery area in stable condition in an  ascent V-pace mode.   EQUIPMENT DATA:  The pacing generator is a Medtronic model J5091061, serial  [id number]. The atrial lead is a Medtronic model U9629235, serial [id number].  The ventricular lead is a Medtronic model [id number], serial [id number].   PACING DATA:  The atrial lead detected a 3.1 millivolt T-wave. The pacing  threshold was 0.9 volts at 0.5 msec pulse width. The impedence was 560 ohms,  resulting in a current threshold of 1.8 MA.  The ventricular lead detected  an 8.1 millivolt R-wave. The pacing threshold was 0.3 volts at 0.5 msec  pulse width. The impedence was 564 ohms, resulting in a current threshold at  0.8 MA.                                                Francisca December, M.D.    JHE/MEDQ  D:  11/07/2002  T:  11/08/2002  Job:  [id number]   cc:   Gloriajean Dell. Andrey Campanile, M.D.  [address]  [place]  [place] [address]  Fax: [phone]   Vesta Mixer, M.D.  [address], [address]  [place]  [place] [address]  Fax: [phone]

## 2010-12-09 NOTE — Op Note (Signed)
NAMEJERIAH, [name]              ACCOUNT NO.:  [id number]   MEDICAL RECORD NO.:  [id number]          PATIENT TYPE:  AMB   LOCATION:  ENDO                         FACILITY:  [facility]   PHYSICIAN:  Anselmo Rod, M.D.  DATE OF BIRTH:  1916/12/17   DATE OF PROCEDURE:  03/24/2005  DATE OF DISCHARGE:                                 OPERATIVE REPORT   PROCEDURE PERFORMED:  Screening colonoscopy.   ENDOSCOPIST:  Anselmo Rod, M.D.   INSTRUMENT USED:  Olympus video colonoscope.   INDICATIONS FOR PROCEDURE:  An 77 year old white female with a history of  adenomatous polyp status post right hemicolectomy in the past undergoing a  repeat colonoscopy to rule out recurrent polyps.   PREPROCEDURE PREPARATION:  Informed consent was procured from the patient.  The patient fasted for eight hours prior to the procedure and prepped with a  bottle of magnesium citrate and a gallon of GoLYTELY the night prior to the  procedure.  Risks and benefits of the procedure including a 10% miss rate of  cancer and polyps was discussed with the patient as well.   PREPROCEDURE PHYSICAL:  VITAL SIGNS:  Stable vital signs.  NECK:  Supple.  CHEST:  Clear to auscultation.  CARDIOVASCULAR:  S1 and S2 regular.  ABDOMEN:  Soft with normal bowel sounds.   DESCRIPTION OF PROCEDURE:  The patient was placed in left lateral decubitus  position, sedated with an additional 20 mg of Demerol and 2 mg of Versed in  slow incremental doses.  Once the patient was adequately sedated and  maintained on low flow oxygen and continuous cardiac monitoring, the Olympus  video colonoscope was advanced from the rectum to the anastomosis without  difficulty.  The patient had scattered sigmoid diverticula with small  internal hemorrhoids seen on retroflexion.  No masses or polyps were  identified.  The distal small-bowel appeared normal.   IMPRESSION:  1.  Small nonbleeding internal hemorrhoids seen on retroflexion.  2.  Scattered  sigmoid diverticulosis.  3.  No masses or polyps seen.  4.  Healthy anastomosis identified at 120 cm.   RECOMMENDATIONS:  1.  A high fiber diet has been advised and brochures on diverticulosis have      been given to the patient for education.  2.  A repeat colonoscopy has been recommended in the next five years unless      the patient develops any abnormal symptoms in the interim.  3.  Outpatient follow-up as need arises in the future.      Anselmo Rod, M.D.  Electronically Signed     JNM/MEDQ  D:  03/24/2005  T:  03/24/2005  Job:  [id number]   cc:   Gloriajean Dell. Andrey Campanile, M.D.  [address]  [place]  [place] [address]  Fax: [phone]   Adolph Pollack, M.D.  [address], [address]  [place]  [place] [address]

## 2011-01-09 ENCOUNTER — Encounter: Payer: Self-pay | Admitting: Cardiovascular Disease

## 2011-07-01 ENCOUNTER — Encounter (HOSPITAL_COMMUNITY): Payer: Self-pay | Admitting: Emergency Medicine

## 2011-07-01 ENCOUNTER — Other Ambulatory Visit: Payer: Self-pay

## 2011-07-01 ENCOUNTER — Inpatient Hospital Stay (HOSPITAL_COMMUNITY)
Admission: EM | Admit: 2011-07-01 | Discharge: 2011-07-05 | DRG: 470 | Disposition: A | Discharge status: Rehabilitation Facility | Payer: Medicare Other | Attending: Internal Medicine | Admitting: Internal Medicine

## 2011-07-01 ENCOUNTER — Emergency Department (HOSPITAL_COMMUNITY): Payer: Medicare Other

## 2011-07-01 DIAGNOSIS — K219 Gastro-esophageal reflux disease without esophagitis: Secondary | ICD-10-CM | POA: Diagnosis present

## 2011-07-01 DIAGNOSIS — Z86718 Personal history of other venous thrombosis and embolism: Secondary | ICD-10-CM

## 2011-07-01 DIAGNOSIS — I495 Sick sinus syndrome: Secondary | ICD-10-CM | POA: Diagnosis present

## 2011-07-01 DIAGNOSIS — Y998 Other external cause status: Secondary | ICD-10-CM

## 2011-07-01 DIAGNOSIS — R413 Other amnesia: Secondary | ICD-10-CM | POA: Diagnosis present

## 2011-07-01 DIAGNOSIS — Z880 Allergy status to penicillin: Secondary | ICD-10-CM

## 2011-07-01 DIAGNOSIS — Z79899 Other long term (current) drug therapy: Secondary | ICD-10-CM

## 2011-07-01 DIAGNOSIS — S7290XA Unspecified fracture of unspecified femur, initial encounter for closed fracture: Secondary | ICD-10-CM | POA: Diagnosis present

## 2011-07-01 DIAGNOSIS — S72033A Displaced midcervical fracture of unspecified femur, initial encounter for closed fracture: Principal | ICD-10-CM | POA: Diagnosis present

## 2011-07-01 DIAGNOSIS — Z87440 Personal history of urinary (tract) infections: Secondary | ICD-10-CM

## 2011-07-01 DIAGNOSIS — H9190 Unspecified hearing loss, unspecified ear: Secondary | ICD-10-CM | POA: Diagnosis present

## 2011-07-01 DIAGNOSIS — R443 Hallucinations, unspecified: Secondary | ICD-10-CM | POA: Diagnosis present

## 2011-07-01 DIAGNOSIS — Y92009 Unspecified place in unspecified non-institutional (private) residence as the place of occurrence of the external cause: Secondary | ICD-10-CM

## 2011-07-01 DIAGNOSIS — F3289 Other specified depressive episodes: Secondary | ICD-10-CM | POA: Diagnosis present

## 2011-07-01 DIAGNOSIS — E039 Hypothyroidism, unspecified: Secondary | ICD-10-CM | POA: Diagnosis present

## 2011-07-01 DIAGNOSIS — W19XXXA Unspecified fall, initial encounter: Secondary | ICD-10-CM

## 2011-07-01 DIAGNOSIS — R259 Unspecified abnormal involuntary movements: Secondary | ICD-10-CM | POA: Diagnosis not present

## 2011-07-01 DIAGNOSIS — F329 Major depressive disorder, single episode, unspecified: Secondary | ICD-10-CM | POA: Diagnosis present

## 2011-07-01 DIAGNOSIS — D62 Acute posthemorrhagic anemia: Secondary | ICD-10-CM | POA: Diagnosis not present

## 2011-07-01 DIAGNOSIS — I10 Essential (primary) hypertension: Secondary | ICD-10-CM

## 2011-07-01 DIAGNOSIS — M109 Gout, unspecified: Secondary | ICD-10-CM | POA: Diagnosis present

## 2011-07-01 DIAGNOSIS — R410 Disorientation, unspecified: Secondary | ICD-10-CM | POA: Diagnosis present

## 2011-07-01 DIAGNOSIS — Z950 Presence of cardiac pacemaker: Secondary | ICD-10-CM

## 2011-07-01 DIAGNOSIS — S7292XA Unspecified fracture of left femur, initial encounter for closed fracture: Secondary | ICD-10-CM

## 2011-07-01 LAB — URINALYSIS, ROUTINE W REFLEX MICROSCOPIC
Bilirubin Urine: NEGATIVE
Ketones, ur: NEGATIVE mg/dL
Nitrite: NEGATIVE
Protein, ur: NEGATIVE mg/dL
Specific Gravity, Urine: 1.017 (ref 1.005–1.030)
Urobilinogen, UA: 0.2 mg/dL (ref 0.0–1.0)

## 2011-07-01 LAB — BASIC METABOLIC PANEL
BUN: 20 mg/dL (ref 6–23)
Calcium: 9.2 mg/dL (ref 8.4–10.5)
Creatinine, Ser: 0.76 mg/dL (ref 0.50–1.10)
GFR calc Af Amer: 82 mL/min — ABNORMAL LOW (ref >90)
GFR calc non Af Amer: 71 mL/min — ABNORMAL LOW (ref >90)
Glucose, Bld: 128 mg/dL — ABNORMAL HIGH (ref 70–99)
Potassium: 3.9 mEq/L (ref 3.5–5.1)

## 2011-07-01 LAB — DIFFERENTIAL
Basophils Relative: 1 % (ref 0–1)
Eosinophils Absolute: 0 10*3/uL (ref 0.0–0.7)
Eosinophils Relative: 0 % (ref 0–5)
Lymphs Abs: 0.9 10*3/uL (ref 0.7–4.0)
Monocytes Absolute: 0.5 10*3/uL (ref 0.1–1.0)
Monocytes Relative: 5 % (ref 3–12)
Neutrophils Relative %: 86 % — ABNORMAL HIGH (ref 43–77)

## 2011-07-01 LAB — CBC
Hemoglobin: 13.3 g/dL (ref 12.0–15.0)
MCH: 29 pg (ref 26.0–34.0)
MCHC: 34.4 g/dL (ref 30.0–36.0)
MCV: 84.3 fL (ref 78.0–100.0)
RBC: 4.59 MIL/uL (ref 3.87–5.11)

## 2011-07-01 LAB — TYPE AND SCREEN: Antibody Screen: NEGATIVE

## 2011-07-01 LAB — URINE MICROSCOPIC-ADD ON

## 2011-07-01 LAB — ABO/RH: ABO/RH(D): O POS

## 2011-07-01 MED ORDER — FENTANYL CITRATE 0.05 MG/ML IJ SOLN
50.0000 ug | Freq: Once | INTRAMUSCULAR | Status: AC
  Administered 2011-07-01: 50 ug via INTRAVENOUS
  Filled 2011-07-01: qty 2

## 2011-07-01 NOTE — ED Notes (Signed)
ZOX:[id number]<AV> Expected date:07/01/11<BR> Expected time: 8:05 PM<BR> Means of arrival:Ambulance<BR> Comments:<BR> EMS 80 GC, Fall w L hip pain, shortening ETA 10

## 2011-07-01 NOTE — ED Provider Notes (Signed)
History     CSN: [id number] Arrival date & time: 07/01/2011  8:43 PM   First MD Initiated Contact with Patient 07/01/11 2047      Chief Complaint  Patient presents with  . Hip Pain    HPI  History provided by the patient and family. Patient presents after a fall at home with complaints of left hip pain. Family reports just leaving patient's home prior to her fall. Patient normally ambulatory with a walker but states that she got up without using this to fix a lampshade. She started to feel loss of balance as she was going backwards and fell on her bottom and left hip. She denies landing on her back or hitting head. She denies loss of consciousness. She denies any dizziness or lightheadedness prior to fall. There was no chest pain, shortness of breath or heart palpitations.   Past Medical History  Diagnosis Date  . Complete heart block     s/p PPM by Dr Amil Amen 2004, recent Gen Change  07/13/10  . Small bowel obstruction   . Depression   . Hypertension   . Hypokalemia   . Recurrent UTI   . Gout   . GERD (gastroesophageal reflux disease)   . DVT (deep venous thrombosis)     Past Surgical History  Procedure Date  . Insert / replace / remove pacemaker     initial pacemaker by Dr Amil Amen 2004, replaced by [facility] for ERI 07/13/10    History reviewed. No pertinent family history.  History  Substance Use Topics  . Smoking status: Former Smoker    Quit date: 07/24/1980  . Smokeless tobacco: Not on file  . Alcohol Use: No    OB History    Grav Para Term Preterm Abortions TAB SAB Ect Mult Living                  Review of Systems  Constitutional: Negative for fever and chills.  Respiratory: Negative for cough and shortness of breath.   Cardiovascular: Negative for chest pain and palpitations.  Gastrointestinal: Negative for abdominal pain.  Genitourinary: Negative for dysuria, urgency and frequency.  Neurological: Negative for dizziness, syncope, speech difficulty,  weakness, light-headedness and headaches.  All other systems reviewed and are negative.    Allergies  Aspirin and Penicillins  Home Medications   Current Outpatient Rx  Name Route Sig Dispense Refill  . ACETAMINOPHEN 500 MG PO TABS Oral Take 500 mg by mouth every 6 (six) hours as needed.      . ALLOPURINOL 100 MG PO TABS Oral Take 100 mg by mouth daily.      Marland Kitchen XANAX PO Oral Take by mouth as needed.      . ASPIRIN 81 MG PO TABS Oral Take 81 mg by mouth daily.      Marland Kitchen BENAZEPRIL-HYDROCHLOROTHIAZIDE 20-12.5 MG PO TABS Oral Take 1 tablet by mouth daily.      Marland Kitchen CITALOPRAM HYDROBROMIDE 10 MG PO TABS Oral Take 10 mg by mouth daily.      Marland Kitchen DILTIAZEM HCL ER 180 MG PO CP24 Oral Take 180 mg by mouth daily.      Marland Kitchen FOLIC ACID 1 MG PO TABS Oral Take 1 mg by mouth daily.      Marland Kitchen LEVOTHYROXINE SODIUM 112 MCG PO TABS Oral Take 1 tablet (112 mcg total) by mouth daily. 30 tablet 11  . GELNIQUE TD Transdermal Place onto the skin daily.      Marland Kitchen PANTOPRAZOLE SODIUM 40 MG PO  TBEC Oral Take 40 mg by mouth daily.      Marland Kitchen POLYSACCHARIDE IRON 150 MG PO CAPS Oral Take by mouth.        BP 165/59  Pulse 79  Temp(Src) 98.9 F (37.2 C) (Oral)  Resp 20  Ht 5\' 4"  (1.626 m)  Wt 140 lb (63.504 kg)  BMI 24.03 kg/m2  SpO2 94%  Physical Exam  Nursing note and vitals reviewed. Constitutional: She is oriented to person, place, and time. She appears well-developed and well-nourished. No distress.  HENT:  Head: Normocephalic and atraumatic.       No battle sign or raccoon eyes.  Eyes: Conjunctivae and EOM are normal. Pupils are equal, round, and reactive to light.  Neck: Normal range of motion. Neck supple.       No cervical midline tenderness  Cardiovascular: Normal rate and regular rhythm.   Pulmonary/Chest: Effort normal. No respiratory distress. She has no wheezes. She has no rales.       Pacemaker  Abdominal: Soft. She exhibits distension. There is no tenderness. There is no rebound and no guarding.    Musculoskeletal:       Tenderness to palpation along the proximal left femur. No obvious deformity, shortening of the leg, or rotation. Normal distal sensations and dorsal pedal pulse. No swelling or bruising. Reduced range of motion secondary to pain  Neurological: She is alert and oriented to person, place, and time.  Skin: Skin is warm.  Psychiatric: She has a normal mood and affect. Her behavior is normal.    ED Course  Procedures (including critical care time)   Labs Reviewed  CBC  DIFFERENTIAL  BASIC METABOLIC PANEL  URINALYSIS, ROUTINE W REFLEX MICROSCOPIC  TYPE AND SCREEN   Dg Chest 1 View  07/01/2011  *RADIOLOGY REPORT*  Clinical Data: The patient fell today.  Left hip fracture.  CHEST - 1 VIEW  Comparison: 07/12/2010  Findings: Stable appearance of cardiac pacemaker since previous study.  Mild cardiac enlargement with normal pulmonary vascularity. Slight fibrosis in the lung bases.  No focal airspace consolidation in the lungs.  No blunting of costophrenic angles.  The thorax. Calcification and torsion of the aorta.  Degenerative changes in the spine and shoulders.  Overall stable appearance of the chest since previous study.  IMPRESSION: Cardiac enlargement.  No evidence of active pulmonary disease.  Original Report Authenticated By: Marlon Pel, M.D.   Dg Hip Complete Left  07/01/2011  *RADIOLOGY REPORT*  Clinical Data: Larey Seat and injured left hip.  LEFT HIP - COMPLETE 2+ VIEW 07/01/2011:  Comparison: None.  Findings: Comminuted subcapital left femoral neck fracture.  Hip joint anatomically aligned with moderate axial joint space narrowing.  No other fractures.  Included AP pelvis demonstrates an intact contralateral right hip with symmetric moderate axial joint space narrowing.  Sacroiliac joints and symphysis pubis intact.  Degenerative changes involving the visualized lower lumbar spine.  Osteopenia.  IMPRESSION: Comminuted subcapital left femoral neck fracture.  Moderate  osteoarthritis.  Original Report Authenticated By: Arnell Sieving, M.D.     1. Femur fracture, left   2. Fall       MDM  8:45 PM patient seen and evaluated. Patient in no acute distress.   Spoke with Dr. Luiz Blare on call for ortho. He will plan for surgery tomorrow morning and requests hospital admission.  With [facility] hospitalist they'll see patient and admit.    Date: 07/01/2011  Rate: 78  Rhythm: Dual-Paced  QRS Axis: indeterminate  Intervals:  ST/T Wave abnormalities:   Conduction Disutrbances: Paced rhythm  Narrative Interpretation:   Old EKG Reviewed: unchanged from 07/12/2010    Angus Seller, PA 07/01/11 [phone]

## 2011-07-02 ENCOUNTER — Encounter (HOSPITAL_COMMUNITY): Payer: Self-pay | Admitting: Anesthesiology

## 2011-07-02 ENCOUNTER — Inpatient Hospital Stay (HOSPITAL_COMMUNITY): Payer: Medicare Other

## 2011-07-02 ENCOUNTER — Encounter (HOSPITAL_COMMUNITY)
Admission: EM | Disposition: A | Discharge status: Rehabilitation Facility | Payer: Self-pay | Source: Home / Self Care | Attending: Family Medicine

## 2011-07-02 ENCOUNTER — Encounter (HOSPITAL_COMMUNITY): Payer: Self-pay | Admitting: Family Medicine

## 2011-07-02 ENCOUNTER — Inpatient Hospital Stay (HOSPITAL_COMMUNITY): Payer: Medicare Other | Admitting: Anesthesiology

## 2011-07-02 DIAGNOSIS — S7290XA Unspecified fracture of unspecified femur, initial encounter for closed fracture: Secondary | ICD-10-CM | POA: Diagnosis present

## 2011-07-02 HISTORY — PX: HIP ARTHROPLASTY: SHX981

## 2011-07-02 LAB — CBC
HCT: 35.8 % — ABNORMAL LOW (ref 36.0–46.0)
Hemoglobin: 12.1 g/dL (ref 12.0–15.0)
MCV: 84.4 fL (ref 78.0–100.0)
RDW: 13.2 % (ref 11.5–15.5)
WBC: 7.3 10*3/uL (ref 4.0–10.5)

## 2011-07-02 LAB — BASIC METABOLIC PANEL
BUN: 15 mg/dL (ref 6–23)
CO2: 26 mEq/L (ref 19–32)
Chloride: 102 mEq/L (ref 96–112)
Creatinine, Ser: 0.73 mg/dL (ref 0.50–1.10)
Glucose, Bld: 111 mg/dL — ABNORMAL HIGH (ref 70–99)
Potassium: 3.9 mEq/L (ref 3.5–5.1)

## 2011-07-02 SURGERY — HEMIARTHROPLASTY, HIP, DIRECT ANTERIOR APPROACH, FOR FRACTURE
Anesthesia: General | Site: Hip | Laterality: Left | Wound class: Clean

## 2011-07-02 MED ORDER — POTASSIUM CHLORIDE IN NACL 20-0.9 MEQ/L-% IV SOLN
INTRAVENOUS | Status: DC
  Administered 2011-07-02: 05:00:00 via INTRAVENOUS
  Filled 2011-07-02 (×3): qty 1000

## 2011-07-02 MED ORDER — ALPRAZOLAM 0.25 MG PO TABS
0.2500 mg | ORAL_TABLET | Freq: Every day | ORAL | Status: DC
  Administered 2011-07-02 – 2011-07-04 (×3): 0.25 mg via ORAL
  Filled 2011-07-02 (×3): qty 1

## 2011-07-02 MED ORDER — FENTANYL CITRATE 0.05 MG/ML IJ SOLN
25.0000 ug | INTRAMUSCULAR | Status: DC | PRN
  Administered 2011-07-02: 25 ug via INTRAVENOUS

## 2011-07-02 MED ORDER — WARFARIN SODIUM 5 MG PO TABS
5.0000 mg | ORAL_TABLET | Freq: Once | ORAL | Status: AC
  Administered 2011-07-02: 5 mg via ORAL
  Filled 2011-07-02: qty 1

## 2011-07-02 MED ORDER — WHITE PETROLATUM GEL
Status: AC
  Administered 2011-07-02: 01:00:00
  Filled 2011-07-02: qty 5

## 2011-07-02 MED ORDER — SUCCINYLCHOLINE CHLORIDE 20 MG/ML IJ SOLN
INTRAMUSCULAR | Status: DC | PRN
  Administered 2011-07-02: 100 mg via INTRAVENOUS

## 2011-07-02 MED ORDER — ACETAMINOPHEN 10 MG/ML IV SOLN
INTRAVENOUS | Status: AC
  Filled 2011-07-02: qty 100

## 2011-07-02 MED ORDER — LACTATED RINGERS IV SOLN: INTRAVENOUS | Status: DC

## 2011-07-02 MED ORDER — BUPIVACAINE-EPINEPHRINE PF 0.25-1:200000 % IJ SOLN
INTRAMUSCULAR | Status: DC | PRN
  Administered 2011-07-02: 20 mL

## 2011-07-02 MED ORDER — ZOLPIDEM TARTRATE 5 MG PO TABS: 5.0000 mg | ORAL_TABLET | Freq: Every evening | ORAL | Status: DC | PRN

## 2011-07-02 MED ORDER — ENOXAPARIN SODIUM 40 MG/0.4ML ~~LOC~~ SOLN
40.0000 mg | SUBCUTANEOUS | Status: DC
  Administered 2011-07-03 – 2011-07-05 (×3): 40 mg via SUBCUTANEOUS
  Filled 2011-07-02 (×4): qty 0.4

## 2011-07-02 MED ORDER — EPHEDRINE SULFATE 50 MG/ML IJ SOLN
INTRAMUSCULAR | Status: DC | PRN
  Administered 2011-07-02: 10 mg via INTRAVENOUS

## 2011-07-02 MED ORDER — DILTIAZEM HCL ER 180 MG PO CP24
180.0000 mg | ORAL_CAPSULE | Freq: Every day | ORAL | Status: DC
  Administered 2011-07-03 – 2011-07-05 (×3): 180 mg via ORAL
  Filled 2011-07-02 (×4): qty 1

## 2011-07-02 MED ORDER — KCL IN DEXTROSE-NACL 20-5-0.45 MEQ/L-%-% IV SOLN
INTRAVENOUS | Status: DC
  Administered 2011-07-02: 14:00:00 via INTRAVENOUS
  Administered 2011-07-03: 100 mL/h via INTRAVENOUS
  Administered 2011-07-03 – 2011-07-04 (×2): via INTRAVENOUS
  Administered 2011-07-04: 100 mL/h via INTRAVENOUS
  Filled 2011-07-02 (×7): qty 1000

## 2011-07-02 MED ORDER — SODIUM CHLORIDE 0.9 % IR SOLN
Status: DC | PRN
  Administered 2011-07-02: 1000 mL

## 2011-07-02 MED ORDER — PROPOFOL 10 MG/ML IV BOLUS
INTRAVENOUS | Status: DC | PRN
  Administered 2011-07-02: 100 mg via INTRAVENOUS

## 2011-07-02 MED ORDER — PROMETHAZINE HCL 25 MG/ML IJ SOLN: 6.2500 mg | INTRAMUSCULAR | Status: DC | PRN

## 2011-07-02 MED ORDER — FOLIC ACID 1 MG PO TABS
1.0000 mg | ORAL_TABLET | Freq: Every day | ORAL | Status: DC
  Administered 2011-07-03 – 2011-07-05 (×3): 1 mg via ORAL
  Filled 2011-07-02 (×4): qty 1

## 2011-07-02 MED ORDER — MORPHINE SULFATE 2 MG/ML IJ SOLN
2.0000 mg | INTRAMUSCULAR | Status: DC | PRN
  Administered 2011-07-02 (×2): 2 mg via INTRAVENOUS
  Filled 2011-07-02 (×2): qty 1

## 2011-07-02 MED ORDER — ONDANSETRON HCL 4 MG/2ML IJ SOLN
INTRAMUSCULAR | Status: DC | PRN
  Administered 2011-07-02: 4 mg via INTRAVENOUS

## 2011-07-02 MED ORDER — HYDROCODONE-ACETAMINOPHEN 5-325 MG PO TABS
1.0000 | ORAL_TABLET | ORAL | Status: DC | PRN
  Administered 2011-07-04: 1 via ORAL
  Administered 2011-07-04: 2 via ORAL
  Administered 2011-07-05: 1 via ORAL
  Filled 2011-07-02: qty 1
  Filled 2011-07-02 (×2): qty 2

## 2011-07-02 MED ORDER — ACETAMINOPHEN 325 MG PO TABS: 650.0000 mg | ORAL_TABLET | ORAL | Status: DC | PRN

## 2011-07-02 MED ORDER — FENTANYL CITRATE 0.05 MG/ML IJ SOLN
INTRAMUSCULAR | Status: DC | PRN
  Administered 2011-07-02 (×2): 50 ug via INTRAVENOUS
  Administered 2011-07-02: 25 ug via INTRAVENOUS

## 2011-07-02 MED ORDER — ACETAMINOPHEN 650 MG RE SUPP
650.0000 mg | Freq: Four times a day (QID) | RECTAL | Status: DC | PRN
  Filled 2011-07-02: qty 1

## 2011-07-02 MED ORDER — LACTATED RINGERS IV SOLN
INTRAVENOUS | Status: DC | PRN
  Administered 2011-07-02 (×2): via INTRAVENOUS

## 2011-07-02 MED ORDER — CEFAZOLIN SODIUM 1-5 GM-% IV SOLN
INTRAVENOUS | Status: AC
  Filled 2011-07-02: qty 50

## 2011-07-02 MED ORDER — ALUM & MAG HYDROXIDE-SIMETH 200-200-20 MG/5ML PO SUSP: 30.0000 mL | Freq: Four times a day (QID) | ORAL | Status: DC | PRN

## 2011-07-02 MED ORDER — ENSURE PO LIQD
1.0000 | Freq: Two times a day (BID) | ORAL | Status: DC
  Administered 2011-07-02 – 2011-07-03 (×2): 1 via ORAL
  Filled 2011-07-02 (×5): qty 237

## 2011-07-02 MED ORDER — CEFAZOLIN SODIUM 1-5 GM-% IV SOLN
INTRAVENOUS | Status: DC | PRN
  Administered 2011-07-02: 1 g via INTRAVENOUS

## 2011-07-02 MED ORDER — ONDANSETRON HCL 4 MG/2ML IJ SOLN: 4.0000 mg | Freq: Four times a day (QID) | INTRAMUSCULAR | Status: DC | PRN

## 2011-07-02 MED ORDER — CITALOPRAM HYDROBROMIDE 10 MG PO TABS
10.0000 mg | ORAL_TABLET | Freq: Every day | ORAL | Status: DC
  Administered 2011-07-03 – 2011-07-05 (×3): 10 mg via ORAL
  Filled 2011-07-02 (×4): qty 1

## 2011-07-02 MED ORDER — BUPIVACAINE-EPINEPHRINE PF 0.25-1:200000 % IJ SOLN
INTRAMUSCULAR | Status: AC
  Filled 2011-07-02: qty 30

## 2011-07-02 MED ORDER — METOCLOPRAMIDE HCL 5 MG/ML IJ SOLN: 5.0000 mg | Freq: Three times a day (TID) | INTRAMUSCULAR | Status: DC | PRN

## 2011-07-02 MED ORDER — METOCLOPRAMIDE HCL 5 MG PO TABS
5.0000 mg | ORAL_TABLET | Freq: Three times a day (TID) | ORAL | Status: DC | PRN
Start: 2011-07-02 — End: 2011-07-05
  Filled 2011-07-02: qty 2

## 2011-07-02 MED ORDER — ACETAMINOPHEN 325 MG PO TABS
650.0000 mg | ORAL_TABLET | Freq: Four times a day (QID) | ORAL | Status: DC | PRN
  Administered 2011-07-03: 650 mg via ORAL
  Filled 2011-07-02: qty 2

## 2011-07-02 MED ORDER — ONDANSETRON HCL 4 MG PO TABS
4.0000 mg | ORAL_TABLET | Freq: Four times a day (QID) | ORAL | Status: DC | PRN
  Filled 2011-07-02: qty 1

## 2011-07-02 MED ORDER — ONDANSETRON HCL 4 MG/2ML IJ SOLN
4.0000 mg | Freq: Four times a day (QID) | INTRAMUSCULAR | Status: DC | PRN
  Administered 2011-07-02: 4 mg via INTRAVENOUS
  Filled 2011-07-02: qty 2

## 2011-07-02 MED ORDER — MORPHINE SULFATE 2 MG/ML IJ SOLN
0.5000 mg | INTRAMUSCULAR | Status: DC | PRN
  Administered 2011-07-03 – 2011-07-04 (×2): 0.5 mg via INTRAVENOUS
  Filled 2011-07-02 (×3): qty 1

## 2011-07-02 MED ORDER — LEVOTHYROXINE SODIUM 112 MCG PO TABS
112.0000 ug | ORAL_TABLET | Freq: Every day | ORAL | Status: DC
  Administered 2011-07-02 – 2011-07-05 (×4): 112 ug via ORAL
  Filled 2011-07-02 (×4): qty 1

## 2011-07-02 MED ORDER — ACETAMINOPHEN 10 MG/ML IV SOLN
INTRAVENOUS | Status: DC | PRN
  Administered 2011-07-02: 1000 mg via INTRAVENOUS

## 2011-07-02 MED ORDER — FENTANYL CITRATE 0.05 MG/ML IJ SOLN
INTRAMUSCULAR | Status: AC
  Filled 2011-07-02: qty 2

## 2011-07-02 MED ORDER — ALLOPURINOL 100 MG PO TABS
100.0000 mg | ORAL_TABLET | Freq: Every day | ORAL | Status: DC
  Administered 2011-07-03 – 2011-07-05 (×3): 100 mg via ORAL
  Filled 2011-07-02 (×4): qty 1

## 2011-07-02 MED ORDER — PANTOPRAZOLE SODIUM 40 MG PO TBEC
40.0000 mg | DELAYED_RELEASE_TABLET | Freq: Every day | ORAL | Status: DC
  Administered 2011-07-03 – 2011-07-05 (×3): 40 mg via ORAL
  Filled 2011-07-02 (×4): qty 1

## 2011-07-02 MED ORDER — OXYCODONE HCL 5 MG PO TABS
5.0000 mg | ORAL_TABLET | ORAL | Status: DC | PRN
  Administered 2011-07-02: 5 mg via ORAL
  Filled 2011-07-02 (×2): qty 1

## 2011-07-02 MED ORDER — ONDANSETRON HCL 4 MG PO TABS: 4.0000 mg | ORAL_TABLET | Freq: Four times a day (QID) | ORAL | Status: DC | PRN

## 2011-07-02 SURGICAL SUPPLY — 43 items
BAG SPEC THK2 15X12 ZIP CLS (MISCELLANEOUS) ×1
BAG ZIPLOCK 12X15 (MISCELLANEOUS) ×2 IMPLANT
BIT DRILL 2.4X128 (BIT) ×1 IMPLANT
BLADE SAW SAG 73X25 THK (BLADE) ×1
BLADE SAW SGTL 73X25 THK (BLADE) ×1 IMPLANT
CLOTH BEACON ORANGE TIMEOUT ST (SAFETY) ×2 IMPLANT
COVER SURGICAL LIGHT HANDLE (MISCELLANEOUS) ×1 IMPLANT
DRAPE INCISE IOBAN 66X45 STRL (DRAPES) ×2 IMPLANT
DRAPE LG THREE QUARTER DISP (DRAPES) ×1 IMPLANT
DRAPE ORTHO SPLIT 77X108 STRL (DRAPES) ×4
DRAPE POUCH INSTRU U-SHP 10X18 (DRAPES) ×2 IMPLANT
DRAPE SURG ORHT 6 SPLT 77X108 (DRAPES) ×2 IMPLANT
DRSG MEPILEX BORDER 4X4 (GAUZE/BANDAGES/DRESSINGS) ×1 IMPLANT
DRSG MEPILEX BORDER 4X8 (GAUZE/BANDAGES/DRESSINGS) ×2 IMPLANT
DURAPREP 26ML APPLICATOR (WOUND CARE) ×2 IMPLANT
ELECT BLADE TIP CTD 4 INCH (ELECTRODE) ×1 IMPLANT
ELECT REM PT RETURN 9FT ADLT (ELECTROSURGICAL) ×2
ELECTRODE REM PT RTRN 9FT ADLT (ELECTROSURGICAL) ×1 IMPLANT
EVACUATOR 1/8 PVC DRAIN (DRAIN) IMPLANT
FACESHIELD LNG OPTICON STERILE (SAFETY) ×8 IMPLANT
GLOVE BIO SURGEON STRL SZ8 (GLOVE) ×2 IMPLANT
GLOVE ECLIPSE 7.5 STRL STRAW (GLOVE) ×2 IMPLANT
IMMOBILIZER KNEE 20 (SOFTGOODS) ×2
IMMOBILIZER KNEE 20 THIGH 36 (SOFTGOODS) IMPLANT
MANIFOLD NEPTUNE II (INSTRUMENTS) ×2 IMPLANT
NEEDLE HYPO 22GX1.5 SAFETY (NEEDLE) ×2 IMPLANT
NS IRRIG 1000ML POUR BTL (IV SOLUTION) ×2 IMPLANT
PACK TOTAL JOINT (CUSTOM PROCEDURE TRAY) ×2 IMPLANT
PASSER SUT SWANSON 36MM LOOP (INSTRUMENTS) ×2 IMPLANT
POSITIONER SURGICAL ARM (MISCELLANEOUS) ×2 IMPLANT
STAPLER VISISTAT 35W (STAPLE) ×2 IMPLANT
SUCTION FRAZIER TIP 10 FR DISP (SUCTIONS) ×2 IMPLANT
SUT ETHIBOND NAB CT1 #1 30IN (SUTURE) ×5 IMPLANT
SUT VIC AB 0 CTX 27 (SUTURE) ×4 IMPLANT
SUT VIC AB 1 CTX 36 (SUTURE) ×8
SUT VIC AB 1 CTX36XBRD ANBCTR (SUTURE) ×4 IMPLANT
SUT VIC AB 2-0 CT1 27 (SUTURE) ×6
SUT VIC AB 2-0 CT1 TAPERPNT 27 (SUTURE) ×3 IMPLANT
SYR CONTROL 10ML LL (SYRINGE) ×2 IMPLANT
TOWEL OR 17X26 10 PK STRL BLUE (TOWEL DISPOSABLE) ×2 IMPLANT
TOWER CARTRIDGE SMART MIX (DISPOSABLE) IMPLANT
TRAY FOLEY CATH 14FRSI W/METER (CATHETERS) IMPLANT
WATER STERILE IRR 1500ML POUR (IV SOLUTION) ×2 IMPLANT

## 2011-07-02 NOTE — Progress Notes (Signed)
Subjective: Pt mentions that she is feeling well.  Has some discomfort at hip but mentions with the pain medicine that it is tolerable.  Denies any fever, chills, palpitations, chest pain or difficulty breathing. Objective: Filed Vitals:   07/02/11 1215 07/02/11 1239 07/02/11 1300 07/02/11 1402  BP: 128/49 127/59 115/61 114/63  Pulse: 70 68 67 66  Temp: 97.8 F (36.6 C) 97.8 F (36.6 C) 97.9 F (36.6 C) 97.9 F (36.6 C)  TempSrc:   Oral Oral  Resp: 16 16 18 18   Height:      Weight:      SpO2: 100% 100% 98% 99%   Weight change:   Intake/Output Summary (Last 24 hours) at 07/02/11 1413 Last data filed at 07/02/11 1200  Gross per 24 hour  Intake   1500 ml  Output   1550 ml  Net    -50 ml    General: Alert, awake, oriented x3, in no acute distress.  HEENT: No bruits, no goiter.  Heart: Regular rate and rhythm  Lungs: Lung are clear to auscultation Abdomen: Soft, nontender, nondistended, positive bowel sounds.  Neuro: Grossly intact, nonfocal.   Lab Results:  [facility] 07/02/11 0742 07/01/11 2130  NA 136 138  K 3.9 3.9  CL 102 103  CO2 26 26  GLUCOSE 111* 128*  BUN 15 20  CREATININE 0.73 0.76  CALCIUM 8.8 9.2  MG -- --  PHOS -- --   No results found for this basename: AST:2,ALT:2,ALKPHOS:2,BILITOT:2,PROT:2,ALBUMIN:2 in the last 72 hours No results found for this basename: LIPASE:2,AMYLASE:2 in the last 72 hours  Basename 07/02/11 0742 07/01/11 2130  WBC 7.3 10.0  NEUTROABS -- 8.6*  HGB 12.1 13.3  HCT 35.8* 38.7  MCV 84.4 84.3  PLT 174 184   No results found for this basename: CKTOTAL:3,CKMB:3,CKMBINDEX:3,TROPONINI:3 in the last 72 hours No results found for this basename: POCBNP:3 in the last 72 hours No results found for this basename: DDIMER:2 in the last 72 hours No results found for this basename: HGBA1C:2 in the last 72 hours No results found for this basename: CHOL:2,HDL:2,LDLCALC:2,TRIG:2,CHOLHDL:2,LDLDIRECT:2 in the last 72 hours No results found for  this basename: TSH,T4TOTAL,FREET3,T3FREE,THYROIDAB in the last 72 hours No results found for this basename: VITAMINB12:2,FOLATE:2,FERRITIN:2,TIBC:2,IRON:2,RETICCTPCT:2 in the last 72 hours  Micro Results: Recent Results (from the past 240 hour(s))  SURGICAL PCR SCREEN     Status: Abnormal   Collection Time   07/02/11  8:33 AM      Component Value Range Status Comment   MRSA, PCR NEGATIVE  NEGATIVE  Final    Staphylococcus aureus SAMPLE NOT SCREENED FOR [facility] (*) NEGATIVE  Final     Studies/Results: Dg Chest 1 View  07/01/2011  *RADIOLOGY REPORT*  Clinical Data: The patient fell today.  Left hip fracture.  CHEST - 1 VIEW  Comparison: 07/12/2010  Findings: Stable appearance of cardiac pacemaker since previous study.  Mild cardiac enlargement with normal pulmonary vascularity. Slight fibrosis in the lung bases.  No focal airspace consolidation in the lungs.  No blunting of costophrenic angles.  The thorax. Calcification and torsion of the aorta.  Degenerative changes in the spine and shoulders.  Overall stable appearance of the chest since previous study.  IMPRESSION: Cardiac enlargement.  No evidence of active pulmonary disease.  Original Report Authenticated By: Marlon Pel, M.D.   Dg Hip Complete Left  07/01/2011  *RADIOLOGY REPORT*  Clinical Data: Larey Seat and injured left hip.  LEFT HIP - COMPLETE 2+ VIEW 07/01/2011:  Comparison: None.  Findings: Comminuted subcapital left femoral neck fracture.  Hip joint anatomically aligned with moderate axial joint space narrowing.  No other fractures.  Included AP pelvis demonstrates an intact contralateral right hip with symmetric moderate axial joint space narrowing.  Sacroiliac joints and symphysis pubis intact.  Degenerative changes involving the visualized lower lumbar spine.  Osteopenia.  IMPRESSION: Comminuted subcapital left femoral neck fracture.  Moderate osteoarthritis.  Original Report Authenticated By: Arnell Sieving, M.D.   Dg  Pelvis Portable  07/02/2011  *RADIOLOGY REPORT*  Clinical Data: Postop  PORTABLE PELVIS  Comparison: 07/01/2011  Findings: Left hip hemiarthroplasty has been placed.  Anatomic alignment of the osseous and prosthetic structures.  No breakage or loosening of the hardware.  IMPRESSION: Left hip hemiarthroplasty anatomically aligned.  Original Report Authenticated By: Donavan Burnet, M.D.   Dg Hip Portable 1 View Left  07/02/2011  *RADIOLOGY REPORT*  Clinical Data: Postop  PORTABLE LEFT HIP - 1 VIEW  Comparison: Yesterday  Findings: Left hip hemiarthroplasty placed.  Anatomic alignment of the osseous and prosthetic structures.  No breakage or loosening of the hardware.  IMPRESSION: Left hip hemiarthroplasty anatomically aligned.  Original Report Authenticated By: Donavan Burnet, M.D.    Medications: I have reviewed the patient's current medications.   Patient Active Hospital Problem List: Fracture, femur (07/02/2011) Ortho managing will f/u on their recommendations.  Pt is s/p arthroplasty bipolar hip day # 1  Also will f/u with ortho's recommendations regarding disposition from their standpoint.  SSS (sick sinus syndrome) ()  Stable Hear rate has been within normal limits  GOUT (07/06/2010)   Pt is currently on allopurinol will plan on continuing.  No gout like pain reported  HYPOTHYROIDISM (07/06/2010)   Will order a TSH if not already done.  But will plan on continuing home dose of synthroid  HYPERTENSION (07/06/2010)   Last blood pressure 114/63.  At this point will continue to monitor  Depression:  Pt is currently on xanax and celexa.   LOS: 1 day   Penny Pia M.D.  [facility] 07/02/2011, 2:13 PM

## 2011-07-02 NOTE — Consult Note (Signed)
Reason for Consult:fractured l. hip Referring Physician: hospitalists  [name] is an 77 y.o. female.  HPI: who fell last pm.  She lives alone with a care-giver in house@8hrs  per day .  She was walking w/o walker and fell back.  No previous hip problems.  Pt has left hip fx. Needing hemi-arthroplasty.  Past Medical History  Diagnosis Date  . Complete heart block     s/p PPM by Dr Amil Amen 2004, recent Gen Change  07/13/10  . Small bowel obstruction   . Depression   . Hypertension   . Hypokalemia   . Recurrent UTI   . Gout   . GERD (gastroesophageal reflux disease)   . DVT (deep venous thrombosis)     Past Surgical History  Procedure Date  . Insert / replace / remove pacemaker     initial pacemaker by Dr Amil Amen 2004, replaced by [facility] for ERI 07/13/10    History reviewed. No pertinent family history.  Social History:  reports that she quit smoking about 30 years ago. She does not have any smokeless tobacco history on file. She reports that she does not drink alcohol or use illicit drugs.  Allergies:  Allergies  Allergen Reactions  . Aspirin   . Penicillins Rash    Medications: i have rewiewed medications in chart  Results for orders placed during the hospital encounter of 07/01/11 (from the past 48 hour(s))  TYPE AND SCREEN     Status: Normal   Collection Time   07/01/11  9:00 PM      Component Value Range Comment   ABO/RH(D) O POS      Antibody Screen NEG      Sample Expiration 07/04/2011     CBC     Status: Normal   Collection Time   07/01/11  9:30 PM      Component Value Range Comment   WBC 10.0  4.0 - 10.5 (K/uL)    RBC 4.59  3.87 - 5.11 (MIL/uL)    Hemoglobin 13.3  12.0 - 15.0 (g/dL)    HCT 16.1  09.6 - 04.5 (%)    MCV 84.3  78.0 - 100.0 (fL)    MCH 29.0  26.0 - 34.0 (pg)    MCHC 34.4  30.0 - 36.0 (g/dL)    RDW 40.9  81.1 - 91.4 (%)    Platelets 184  150 - 400 (K/uL)   DIFFERENTIAL     Status: Abnormal   Collection Time   07/01/11  9:30 PM   Component Value Range Comment   Neutrophils Relative 86 (*) 43 - 77 (%)    Neutro Abs 8.6 (*) 1.7 - 7.7 (K/uL)    Lymphocytes Relative 9 (*) 12 - 46 (%)    Lymphs Abs 0.9  0.7 - 4.0 (K/uL)    Monocytes Relative 5  3 - 12 (%)    Monocytes Absolute 0.5  0.1 - 1.0 (K/uL)    Eosinophils Relative 0  0 - 5 (%)    Eosinophils Absolute 0.0  0.0 - 0.7 (K/uL)    Basophils Relative 1  0 - 1 (%)    Basophils Absolute 0.1  0.0 - 0.1 (K/uL)   BASIC METABOLIC PANEL     Status: Abnormal   Collection Time   07/01/11  9:30 PM      Component Value Range Comment   Sodium 138  135 - 145 (mEq/L)    Potassium 3.9  3.5 - 5.1 (mEq/L)    Chloride 103  96 - 112 (mEq/L)    CO2 26  19 - 32 (mEq/L)    Glucose, Bld 128 (*) 70 - 99 (mg/dL)    BUN 20  6 - 23 (mg/dL)    Creatinine, Ser 5.36  0.50 - 1.10 (mg/dL)    Calcium 9.2  8.4 - 10.5 (mg/dL)    GFR calc non Af Amer 71 (*) >90 (mL/min)    GFR calc Af Amer 82 (*) >90 (mL/min)   URINALYSIS, ROUTINE W REFLEX MICROSCOPIC     Status: Abnormal   Collection Time   07/01/11 10:06 PM      Component Value Range Comment   Color, Urine YELLOW  YELLOW     APPearance CLEAR  CLEAR     Specific Gravity, Urine 1.017  1.005 - 1.030     pH 7.0  5.0 - 8.0     Glucose, UA NEGATIVE  NEGATIVE (mg/dL)    Hgb urine dipstick SMALL (*) NEGATIVE     Bilirubin Urine NEGATIVE  NEGATIVE     Ketones, ur NEGATIVE  NEGATIVE (mg/dL)    Protein, ur NEGATIVE  NEGATIVE (mg/dL)    Urobilinogen, UA 0.2  0.0 - 1.0 (mg/dL)    Nitrite NEGATIVE  NEGATIVE     Leukocytes, UA NEGATIVE  NEGATIVE    URINE MICROSCOPIC-ADD ON     Status: Abnormal   Collection Time   07/01/11 10:06 PM      Component Value Range Comment   Squamous Epithelial / LPF RARE  RARE     RBC / HPF 0-2  <3 (RBC/hpf)    Bacteria, UA MANY (*) RARE     Urine-Other MUCOUS PRESENT   AMORPHOUS URATES/PHOSPHATES  ABO/RH     Status: Normal   Collection Time   07/01/11 10:35 PM      Component Value Range Comment   ABO/RH(D) O POS        Dg Chest 1 View  07/01/2011  *RADIOLOGY REPORT*  Clinical Data: The patient fell today.  Left hip fracture.  CHEST - 1 VIEW  Comparison: 07/12/2010  Findings: Stable appearance of cardiac pacemaker since previous study.  Mild cardiac enlargement with normal pulmonary vascularity. Slight fibrosis in the lung bases.  No focal airspace consolidation in the lungs.  No blunting of costophrenic angles.  The thorax. Calcification and torsion of the aorta.  Degenerative changes in the spine and shoulders.  Overall stable appearance of the chest since previous study.  IMPRESSION: Cardiac enlargement.  No evidence of active pulmonary disease.  Original Report Authenticated By: Marlon Pel, M.D.   Dg Hip Complete Left  07/01/2011  *RADIOLOGY REPORT*  Clinical Data: Larey Seat and injured left hip.  LEFT HIP - COMPLETE 2+ VIEW 07/01/2011:  Comparison: None.  Findings: Comminuted subcapital left femoral neck fracture.  Hip joint anatomically aligned with moderate axial joint space narrowing.  No other fractures.  Included AP pelvis demonstrates an intact contralateral right hip with symmetric moderate axial joint space narrowing.  Sacroiliac joints and symphysis pubis intact.  Degenerative changes involving the visualized lower lumbar spine.  Osteopenia.  IMPRESSION: Comminuted subcapital left femoral neck fracture.  Moderate osteoarthritis.  Original Report Authenticated By: Arnell Sieving, M.D.    ROS: reviewed and no pertinent responses as relates to hpi PE: Blood pressure 164/76, pulse 92, temperature 98.4 F (36.9 C), temperature source Oral, resp. rate 22, height 5\' 4"  (1.626 m), weight 64.4 kg (141 lb 15.6 oz), SpO2 95.00%. Heent: edentulous wdwn female nad Eyes not  dilated Not using accessory muscles of respiration Alert and oriented times three Assessment/Plan: 77 yo female with fractured l. Hip. Needs medical clearance and when cleared will take to OR for hemi-arthroplasty l.  hip  [name] L 07/02/2011, 7:12 AM

## 2011-07-02 NOTE — Progress Notes (Signed)
Surgical PCR and CHG cloth bath done. Pt given Morphine 2mg  for severe pain. Consents obtained prior to adm of pain medication.

## 2011-07-02 NOTE — Brief Op Note (Signed)
07/01/2011 - 07/02/2011  11:30 AM  PATIENT:  Ether Griffins Delaughter  77 y.o. female  PRE-OPERATIVE DIAGNOSIS:  left hip fracture  POST-OPERATIVE DIAGNOSIS:  left hip fracture  PROCEDURE:  Procedure(s): ARTHROPLASTY BIPOLAR HIP  SURGEON:  Surgeon(s): Harvie Junior  PHYSICIAN ASSISTANT:   ASSISTANTS: williams    ANESTHESIA:   general  EBL:  Total I/O In: 1300 [I.V.:1300] Out: 700 [Urine:200; Blood:500]  BLOOD ADMINISTERED:none  DRAINS: none   LOCAL MEDICATIONS USED:  MARCAINE 20CC  SPECIMEN:  No Specimen  DISPOSITION OF SPECIMEN:  N/A  COUNTS:  YES  TOURNIQUET:  * No tourniquets in log *  DICTATION: .Other Dictation: Dictation Number [id number]  PLAN OF CARE: Admit to inpatient   PATIENT DISPOSITION:  PACU - hemodynamically stable.

## 2011-07-02 NOTE — Anesthesia Postprocedure Evaluation (Signed)
  Anesthesia Post-op Note  Patient: [name]  Procedure(s) Performed:  ARTHROPLASTY BIPOLAR HIP  Patient Location: PACU  Anesthesia Type: General  Level of Consciousness: awake and alert   Airway and Oxygen Therapy: Patient Spontanous Breathing  Post-op Pain: mild  Post-op Assessment: Post-op Vital signs reviewed, Patient's Cardiovascular Status Stable, Respiratory Function Stable, Patent Airway and No signs of Nausea or vomiting  Post-op Vital Signs: stable  Complications: No apparent anesthesia complications

## 2011-07-02 NOTE — H&P (Signed)
PCP:   Cindy Hoit, MD   Chief Complaint:  Fall  HPI: This is a pleasant, healthy 77 year old female who lives alone, today she is ambulating without her walker, lost her balance and fell backwards. She did not hit her head, she did not loose consciousness. She was able to call for assistance and was brought to the ER, where imaging revealed left femur fracture. The hospitalist and call with a request to admit. The patient has no cardiac history, she's never had an MI, no history of CHF, no history of chest pains. She does have a history of sick sinus syndrome and has a pacemaker. She is followed by [name] cardiology. The patient uses a walker at home, she does some ADLs but this is limited. She has a very supportive family who are present at the bedside. History obtained from patient and family  Review of Systems: Positives bolded The patient denies anorexia, fever, weight loss,, vision loss, decreased hearing, hoarseness, chest pain, syncope, dyspnea on exertion, peripheral edema, balance deficits, hemoptysis, abdominal pain, melena, hematochezia, severe indigestion/heartburn, hematuria, incontinence, genital sores, muscle weakness, suspicious skin lesions, transient blindness, difficulty walking, depression, unusual weight change, abnormal bleeding, enlarged lymph nodes, angioedema, and breast masses.  Past Medical History: Past Medical History  Diagnosis Date  . Complete heart block     s/p PPM by Dr [name] 2004, recent Gen Change  07/13/10  . Small bowel obstruction   . Depression   . Hypertension   . Hypokalemia   . Recurrent UTI   . Gout   . GERD (gastroesophageal reflux disease)   . DVT (deep venous thrombosis)    Past Surgical History  Procedure Date  . Insert / replace / remove pacemaker     initial pacemaker by Dr [name] 2004, replaced by [name] for ERI 07/13/10    Medications: Prior to Admission medications   Medication Sig Start Date End Date Taking? Authorizing  Provider  acetaminophen (TYLENOL) 325 MG tablet Take 650 mg by mouth every 4 (four) hours as needed. For pain.    Yes Historical Provider, MD  allopurinol (ZYLOPRIM) 100 MG tablet Take 100 mg by mouth daily.     Yes Historical Provider, MD  ALPRAZolam Prudy Feeler) 0.5 MG tablet Take 0.25 mg by mouth at bedtime.     Yes Historical Provider, MD  citalopram (CELEXA) 10 MG tablet Take 10 mg by mouth daily.     Yes Historical Provider, MD  diltiazem (DILACOR XR) 180 MG 24 hr capsule Take 180 mg by mouth daily.     Yes Historical Provider, MD  ENSURE (ENSURE) Take 237 mLs by mouth 2 (two) times daily.     Yes Historical Provider, MD  folic acid (FOLVITE) 1 MG tablet Take 1 mg by mouth daily.     Yes Historical Provider, MD  levothyroxine (SYNTHROID) 112 MCG tablet Take 1 tablet (112 mcg total) by mouth daily. 08/29/10 08/29/11 Yes [name]., MD  Oxybutynin Chloride (GELNIQUE TD) Place 1 packet onto the skin daily.    Yes Historical Provider, MD  pantoprazole (PROTONIX) 40 MG tablet Take 40 mg by mouth daily.     Yes Historical Provider, MD  POLYSACCHARIDE IRON (IRON POLYSACCHARIDES) 150 MG CAPS Take 150 mg by mouth 2 (two) times daily.    Yes Historical Provider, MD    Allergies:   Allergies  Allergen Reactions  . Aspirin   . Penicillins Rash    Social History:  reports that she quit smoking about 30  years ago. She does not have any smokeless tobacco history on file. She reports that she does not drink alcohol or use illicit drugs.  Family History: History reviewed. No pertinent family history.  Physical Exam: Filed Vitals:   07/01/11 2035 07/01/11 2039 07/01/11 2357  BP:  165/59 160/75  Pulse:  79 83  Temp:  98.9 F (37.2 C) 98.7 F (37.1 C)  TempSrc:  Oral Oral  Resp:  20 18  Height: 5\' 4"  (1.626 m)    Weight: 63.504 kg (140 lb)    SpO2:  94% 95%    General:  Alert and oriented times three, well developed and nourished, no acute distress Eyes: PERRLA, pink conjunctiva, no  scleral icterus ENT: Moist oral mucosa, neck supple, no thyromegaly Lungs: clear to ascultation, no wheeze, no crackles, no use of accessory muscles Cardiovascular: regular rate and rhythm, no regurgitation, no gallops, no murmurs. No carotid bruits, no JVD Abdomen: soft, positive BS, non-tender, non-distended, no organomegaly, not an acute abdomen GU: not examined Neuro: CN II - XII grossly intact, sensation intact Musculoskeletal: Strength not assessed in left lower extremity, 5 out of 5 in all other extremities no clubbing, cyanosis or edema Skin: no rash, no subcutaneous crepitation, no decubitus Psych: appropriate patient   Labs on Admission:   [facility] [name] 07/01/11 2130  NA 138  K 3.9  CL 103  CO2 26  GLUCOSE 128*  BUN 20  CREATININE 0.76  CALCIUM 9.2  MG --  PHOS --   No results found for this basename: AST:2,ALT:2,ALKPHOS:2,BILITOT:2,PROT:2,ALBUMIN:2 in the last 72 hours No results found for this basename: LIPASE:2,AMYLASE:2 in the last 72 hours  Basename 07/01/11 2130  WBC 10.0  NEUTROABS 8.6*  HGB 13.3  HCT 38.7  MCV 84.3  PLT 184   No results found for this basename: CKTOTAL:3,CKMB:3,CKMBINDEX:3,TROPONINI:3 in the last 72 hours No results found for this basename: TSH,T4TOTAL,FREET3,T3FREE,THYROIDAB in the last 72 hours No results found for this basename: VITAMINB12:2,FOLATE:2,FERRITIN:2,TIBC:2,IRON:2,RETICCTPCT:2 in the last 72 hours  Radiological Exams on Admission: Dg Chest 1 View  07/01/2011  *RADIOLOGY REPORT*  Clinical Data: The patient fell today.  Left hip fracture.  CHEST - 1 VIEW  Comparison: 07/12/2010  Findings: Stable appearance of cardiac pacemaker since previous study.  Mild cardiac enlargement with normal pulmonary vascularity. Slight fibrosis in the lung bases.  No focal airspace consolidation in the lungs.  No blunting of costophrenic angles.  The thorax. Calcification and torsion of the aorta.  Degenerative changes in the spine and shoulders.   Overall stable appearance of the chest since previous study.  IMPRESSION: Cardiac enlargement.  No evidence of active pulmonary disease.  Original Report Authenticated By: [name], M.D.   Dg Hip Complete Left  07/01/2011  *RADIOLOGY REPORT*  Clinical Data: Larey Seat and injured left hip.  LEFT HIP - COMPLETE 2+ VIEW 07/01/2011:  Comparison: None.  Findings: Comminuted subcapital left femoral neck fracture.  Hip joint anatomically aligned with moderate axial joint space narrowing.  No other fractures.  Included AP pelvis demonstrates an intact contralateral right hip with symmetric moderate axial joint space narrowing.  Sacroiliac joints and symphysis pubis intact.  Degenerative changes involving the visualized lower lumbar spine.  Osteopenia.  IMPRESSION: Comminuted subcapital left femoral neck fracture.  Moderate osteoarthritis.  Original Report Authenticated By: Arnell Sieving, M.D.   EKG: paced rythmn  Assessment/Plan Present on Admission:  .Fracture, femur Admit to telemetry  Ortho Dr. Luiz Blare aware, planned surgery 9 AM  Patient's a low - moderate  risk surgery based on age. .Hypothyroid .SSS (sick sinus syndrome) .HYPERTENSION .GOUT .HYPOTHYROIDISM Stable Resume home medications    DO NOT RESUSCITATE DVT prophylaxis Team 4/Dr. Cena Benton   Time in 11:45 PM Time out 12:16 AM    [name] 07/02/2011, 12:36 AM

## 2011-07-02 NOTE — ED Notes (Addendum)
Pts family would like to be contacted once room is available and/if pt requests family.  Teena Irani -[phone] Wallace Cullens- [phone]

## 2011-07-02 NOTE — Transfer of Care (Signed)
Immediate Anesthesia Transfer of Care Note  Patient: [name]  Procedure(s) Performed:  ARTHROPLASTY BIPOLAR HIP  Patient Location: PACU  Anesthesia Type: General  Level of Consciousness: awake and patient cooperative  Airway & Oxygen Therapy: Patient Spontanous Breathing and Patient connected to face mask oxygen  Post-op Assessment: Report given to PACU RN and Post -op Vital signs reviewed and stable  Post vital signs: Reviewed and stable  Complications: No apparent anesthesia complications

## 2011-07-02 NOTE — Progress Notes (Signed)
Pt's grandson brought pt's hearing aid and glasses. Pt hearing much better.

## 2011-07-02 NOTE — Anesthesia Preprocedure Evaluation (Addendum)
Anesthesia Evaluation  Patient identified by MRN, date of birth, ID band Patient awake    Reviewed: Allergy & Precautions, H&P , NPO status , Patient's Chart, lab work & pertinent test results  Airway Mallampati: II TM Distance: >3 FB Neck ROM: Full    Dental No notable dental hx.    Pulmonary neg pulmonary ROS,  clear to auscultation  Pulmonary exam normal       Cardiovascular hypertension, neg cardio ROS Regular Normal Pacemaker secondary CHB   Neuro/Psych Negative Neurological ROS  Negative Psych ROS   GI/Hepatic negative GI ROS, Neg liver ROS, GERD-  ,  Endo/Other  Negative Endocrine ROSHypothyroidism   Renal/GU negative Renal ROS  Genitourinary negative   Musculoskeletal negative musculoskeletal ROS (+)   Abdominal   Peds negative pediatric ROS (+)  Hematology negative hematology ROS (+)   Anesthesia Other Findings   Reproductive/Obstetrics negative OB ROS                          Anesthesia Physical Anesthesia Plan  ASA: III  Anesthesia Plan: General   Post-op Pain Management:    Induction: Intravenous  Airway Management Planned: Oral ETT  Additional Equipment:   Intra-op Plan:   Post-operative Plan: Extubation in OR  Informed Consent: I have reviewed the patients History and Physical, chart, labs and discussed the procedure including the risks, benefits and alternatives for the proposed anesthesia with the patient or authorized representative who has indicated his/her understanding and acceptance.   Dental advisory given  Plan Discussed with: CRNA  Anesthesia Plan Comments:         Anesthesia Quick Evaluation

## 2011-07-02 NOTE — ED Provider Notes (Signed)
Medical screening examination/treatment/procedure(s) were performed by non-physician practitioner and as supervising physician I was immediately available for consultation/collaboration.   Laray Anger, DO 07/02/11 [phone]

## 2011-07-02 NOTE — Progress Notes (Signed)
ANTICOAGULATION CONSULT NOTE - Initial Consult  Pharmacy Consult for Coumadin Indication: VTE prophylaxis s/p hip fracture repair  Allergies  Allergen Reactions  . Aspirin   . Penicillins Rash    Patient Measurements: Height: 5\' 4"  (162.6 cm) Weight: 141 lb 15.6 oz (64.4 kg) IBW/kg (Calculated) : 54.7   Vital Signs: Temp: 97.8 F (36.6 C) (12/09 1239) Temp src: Oral (12/09 0819) BP: 127/59 mmHg (12/09 1239) Pulse Rate: 68  (12/09 1239)  Labs:  [facility] 07/02/11 0742 07/01/11 2130  HGB 12.1 13.3  HCT 35.8* 38.7  PLT 174 184  APTT -- --  LABPROT -- --  INR -- --  HEPARINUNFRC -- --  CREATININE 0.73 0.76  CKTOTAL -- --  CKMB -- --  TROPONINI -- --   Estimated Creatinine Clearance: 38.7 ml/min (by C-G formula based on Cr of 0.73).  Medical History: Past Medical History  Diagnosis Date  . Complete heart block     s/p PPM by Dr Amil Amen 2004, recent Gen Change  07/13/10  . Small bowel obstruction   . Depression   . Hypertension   . Hypokalemia   . Recurrent UTI   . Gout   . GERD (gastroesophageal reflux disease)   . DVT (deep venous thrombosis)     Medications:  Scheduled:    . allopurinol  100 mg Oral Daily  . ALPRAZolam  0.25 mg Oral QHS  . citalopram  10 mg Oral Daily  . diltiazem  180 mg Oral Daily  . enoxaparin  40 mg Subcutaneous Q24H  . ENSURE  1 Can Oral BID  . fentaNYL      . fentaNYL  50 mcg Intravenous Once  . folic acid  1 mg Oral Daily  . levothyroxine  112 mcg Oral QAC breakfast  . pantoprazole  40 mg Oral Daily  . white petrolatum       Infusions:    . 0.9 % NaCl with KCl 20 mEq / L 75 mL/hr at 07/02/11 0439  . dextrose 5 % and 0.45 % NaCl with KCl 20 mEq/L    . lactated ringers      Assessment: 77 yo F s/p hip fracture repair on 12/9 Lovenox and coumadin ordered for VTE ppx  Goal of Therapy:  INR 2-3   Plan:  Coumadin 5mg  PO x1 dose at 2000 today Follow up daily INR, CBC  Lynann Beaver PharmD  Pager  [phone] 07/02/2011 1:08 PM

## 2011-07-02 NOTE — Op Note (Signed)
[name], [name]              ACCOUNT NO.:  [id number]  MEDICAL RECORD NO.:  [id number]  LOCATION:  [facility]                         FACILITY:  [facility]  PHYSICIAN:  Harvie Junior, M.D.   DATE OF BIRTH:  1916/09/15  DATE OF PROCEDURE:  07/02/2011 DATE OF DISCHARGE:                              OPERATIVE REPORT   PREOPERATIVE DIAGNOSIS:  Femoral neck fracture, left.  POSTOPERATIVE DIAGNOSE:  Femoral neck fracture, left.  PROCEDURE:  Hemiarthroplasty, left hip.  SURGEONS:  Harvie Junior, M.D.  ASSISTANTMayford Knife PA-C.  ANESTHESIA:  General.  BRIEF HISTORY:  [name] is a 77 year old female, who slipped and fell backwards onto her left hip.  She suffered a femoral neck fracture. She was admitted to medical service and we were consulted for treatment. X-ray showed she had a femoral neck fracture.  I had a long talk with she and her family about treatment options, so we felt that the most appropriate course of action was hemiarthroplasty.  She was brought to the operative room for this procedure.  PROCEDURE IN DETAIL:  The patient was brought to the operating room. After adequate anesthesia was obtained with general anesthetic, the patient was placed supine on the operating table.  Left hip was then prepped and draped in sterile fashion.  Following this, attention was turned to the left hip where after routine prep and drape, an incision was made for a posterior approach to the hip.  The patient originally was placed in a right lateral decubitus position.  All bony prominences were being well padded.  After incision was made in the left side, attention was turned to the tensor fascia, divided in line with its fibers, and the short external rotators, piriformis were taken down and tagged.  Following this, the provisional neck cut was made and the head was removed, sized to a 47.  Acetabulum was inspected and all bony fragments were removed and it was irrigated.  Attention  was then turned to the stem side, an introducer was used followed by lateralizing reamer, and this was sequentially reamed up to a level of 5.  Excellent fit was obtained with a 5 with good stability.  So, we elected to go press-fit.  The __________ was opened and hammered into place.  Once this was achieved stability wise, attention was turned towards placement of +0 ball trial.  Excellent stability, range of motion and at this point, the final ball was open +0 47.  This was hammered into place and the hip was reduced and put through a range of motion.  Again, stability was achieved.  Short external rotators, piriformis were repaired to the posterior intertrochanteric line through drill holes. The tensor fascia was then closed with 1 Vicryl running, skin with 0 and 2-0 Vicryl and skin staples.  Sterile compressive dressing was applied as well as short knee immobilizer.  The patient was taken to recovery room and noted to be in satisfactory position.  Estimated blood loss for the procedure was 500 cc.     Harvie Junior, M.D.     Ranae Plumber  D:  07/02/2011  T:  07/02/2011  Job:  [id number]

## 2011-07-02 NOTE — Progress Notes (Signed)
Spoke with Bonita Quin pt's daughter-in-law made aware she is going to OR. States she is on her way, will be waiting in surgical waiting area.

## 2011-07-03 LAB — CBC
HCT: 28.2 % — ABNORMAL LOW (ref 36.0–46.0)
MCV: 85.2 fL (ref 78.0–100.0)
Platelets: 129 10*3/uL — ABNORMAL LOW (ref 150–400)
RBC: 3.31 MIL/uL — ABNORMAL LOW (ref 3.87–5.11)
WBC: 6.5 10*3/uL (ref 4.0–10.5)

## 2011-07-03 LAB — URINE CULTURE
Colony Count: NO GROWTH
Culture  Setup Time: 201212090200
Culture: NO GROWTH

## 2011-07-03 LAB — PROTIME-INR: INR: 1.36 (ref 0.00–1.49)

## 2011-07-03 MED ORDER — BOOST PLUS PO LIQD
237.0000 mL | Freq: Two times a day (BID) | ORAL | Status: DC
  Administered 2011-07-03 – 2011-07-05 (×4): 237 mL via ORAL
  Filled 2011-07-03 (×6): qty 237

## 2011-07-03 MED ORDER — WARFARIN SODIUM 4 MG PO TABS
4.0000 mg | ORAL_TABLET | Freq: Once | ORAL | Status: AC
  Administered 2011-07-03: 4 mg via ORAL
  Filled 2011-07-03: qty 1

## 2011-07-03 MED ORDER — COUMADIN BOOK
Freq: Once | Status: AC
  Administered 2011-07-03: 18:00:00
  Filled 2011-07-03: qty 1

## 2011-07-03 MED ORDER — WARFARIN VIDEO
Freq: Once | Status: AC
  Administered 2011-07-03: 17:00:00

## 2011-07-03 NOTE — Progress Notes (Signed)
Subjective: 1 Day Post-Op Procedure(s) (LRB): ARTHROPLASTY BIPOLAR HIP (Left) Patient reports pain as 3 on 0-10 scale.    Objective: Vital signs in last 24 hours: Temp:  [97.8 F (36.6 C)-99.6 F (37.6 C)] 99.1 F (37.3 C) (12/10 4782) Pulse Rate:  [63-75] 74  (12/10 0608) Resp:  [16-22] 20  (12/10 0608) BP: (113-155)/(49-66) 133/65 mmHg (12/10 0608) SpO2:  [10 %-100 %] 100 % (12/10 9562)  Intake/Output from previous day: 12/09 0701 - 12/10 0700 In: 3181.7 [P.O.:100; I.V.:3081.7] Out: 2000 [Urine:1500; Blood:500] Intake/Output this shift:     Basename 07/03/11 0455 07/02/11 0742 07/01/11 2130  HGB 9.5* 12.1 13.3    Basename 07/03/11 0455 07/02/11 0742  WBC 6.5 7.3  RBC 3.31* 4.24  HCT 28.2* 35.8*  PLT 129* 174    Basename 07/02/11 0742 07/01/11 2130  NA 136 138  K 3.9 3.9  CL 102 103  CO2 26 26  BUN 15 20  CREATININE 0.73 0.76  GLUCOSE 111* 128*  CALCIUM 8.8 9.2    Basename 07/03/11 0455  LABPT --  INR 1.36    Neurologically intact Neurovascular intact Sensation intact distally Intact pulses distally Dorsiflexion/Plantar flexion intact Compartment soft  Assessment/Plan: 1 Day Post-Op Procedure(s) (LRB): ARTHROPLASTY BIPOLAR HIP (Left) Advance diet Cont anti-coag Will likely need SNF  [name] 07/03/2011, 7:53 AM

## 2011-07-03 NOTE — Progress Notes (Signed)
Physical Therapy Evaluation Patient Details Name: TELIA AMUNDSON MRN: [id number] DOB: March 10, 1917 Today's Date: 07/03/2011 Time: 950-956 (pt in too much pain and needed tylenol) [id number] Charge: EVII  Problem List:  Patient Active Problem List  Diagnoses  . Chest pain, unspecified  . Small bowel obstruction  . Pacemaker  . Depression  . AV block  . Recurrent UTI  . SSS (sick sinus syndrome)  . GOUT  . GERD (gastroesophageal reflux disease)  . DVT (deep venous thrombosis)  . HYPOTHYROIDISM  . HYPOKALEMIA  . ANEMIA  . HYPERTENSION  . Fracture, femur    Past Medical History:  Past Medical History  Diagnosis Date  . Complete heart block     s/p PPM by Dr Amil Amen 2004, recent Gen Change  07/13/10  . Small bowel obstruction   . Depression   . Hypertension   . Hypokalemia   . Recurrent UTI   . Gout   . GERD (gastroesophageal reflux disease)   . DVT (deep venous thrombosis)    Past Surgical History:  Past Surgical History  Procedure Date  . Insert / replace / remove pacemaker     initial pacemaker by Dr Amil Amen 2004, replaced by [facility] for ERI 07/13/10    PT Assessment/Plan/Recommendation PT Assessment Clinical Impression Statement: Limited evaluation 2* increased L hip and thigh pain with any movement.  Pt currently only taking tylenol 2* to hallucations with other pain med.  Pt will likely need more pain meds in order to participate/tolerate therapy.  Pt educated on posterior hip precautions.  Pt and family would like CIR upon D/C and report pt has been there before.  Will continue to further assess mobility as pt is able to tolerate.  Pt would benefit from skilled PT services in order to improve strength in L LE as well as increase independence with mobility once pain is under control. PT Recommendation/Assessment: Patient will need skilled PT in the acute care venue PT Problem List: Decreased strength;Decreased activity tolerance;Decreased mobility;Decreased  balance;Decreased knowledge of precautions;Pain;Decreased knowledge of use of DME PT Therapy Diagnosis : Acute pain (decreased bed mobility) PT Plan PT Frequency: Min 4X/week PT Treatment/Interventions: DME instruction;Gait training;Functional mobility training;Therapeutic activities;Therapeutic exercise;Balance training;Patient/family education;Neuromuscular re-education PT Recommendation Recommendations for Other Services: OT consult;Rehab consult Follow Up Recommendations: Inpatient Rehab Equipment Recommended: Defer to next venue PT Goals  Acute Rehab PT Goals PT Goal Formulation: With patient Time For Goal Achievement: 2 weeks Pt will go Supine/Side to Sit: with min assist PT Goal: Supine/Side to Sit - Progress: Progressing toward goal Pt will go Sit to Stand: with min assist PT Goal: Sit to Stand - Progress: Other (comment) Pt will Transfer Bed to Chair/Chair to Bed: with min assist PT Transfer Goal: Bed to Chair/Chair to Bed - Progress: Other (comment) Pt will Ambulate: with mod assist;with rolling walker;1 - 15 feet PT Goal: Ambulate - Progress: Other (comment) Pt will Perform Home Exercise Program: with supervision, verbal cues required/provided PT Goal: Perform Home Exercise Program - Progress: Progressing toward goal  PT Evaluation Precautions/Restrictions  Precautions Precautions: Posterior Hip;Fall Required Braces or Orthoses: Yes Knee Immobilizer: On except when in CPM Restrictions Weight Bearing Restrictions: Yes LLE Weight Bearing: Weight bearing as tolerated Other Position/Activity Restrictions: WBAT per orders Prior Functioning  Home Living Lives With: Alone Receives Help From: Personal care attendant (CNA during the day) Type of Home: House Additional Comments: Sister and son live next door.  CNA came in room toward end of session (wearing CAM boot). Prior Function  Level of Independence: Requires assistive device for independence Comments: Pt reports she  uses RW for mobility. Cognition Cognition Arousal/Alertness: Awake/alert Overall Cognitive Status: Appears within functional limits for tasks assessed Orientation Level: Oriented to person;Oriented to place;Oriented to situation Cognition - Other Comments: pt continues to see hallucinations of bird and cat outside room Sensation/Coordination   Extremity Assessment RLE Strength RLE Overall Strength Comments: grossly at least 3/5 as pt was able to move extremity fully in bed LLE Assessment LLE Assessment: Exceptions to [facility] LLE Strength LLE Overall Strength Comments: pt able to perform ankle pump, fair quad contraction present, did not test further 2* pain Mobility (including Balance) Bed Mobility Bed Mobility: Yes Supine to Sit: Other (comment);4: Min assist Supine to Sit Details (indicate cue type and reason): attempted to position pt diagonally in bed with minA for L LE support, but pt in too much pain to continue with transfer Scooting to [facility]: 1: +2 Total assist Scooting to [facility] Details (indicate cue type and reason): pt=20%, pt attempted to assist with UEs and verbal cues to use R LE to assist Transfers Transfers: No (Pt unable 2* increased pain with L LE movement)    Exercise  Total Joint Exercises Ankle Circles/Pumps: AROM;Both;15 reps;Supine Quad Sets: AROM;Strengthening;Both;20 reps;Supine Gluteal Sets: AROM;Strengthening;Both;Other reps (comment);Supine (15 reps) End of Session PT - End of Session Activity Tolerance: Patient limited by pain Patient left: in bed;with call bell in reach;with family/visitor present Nurse Communication:  (unable to transfer 2* increased pain) General Behavior During Session: [facility] for tasks performed Cognition: [facility] for tasks performed  Marshell Dilauro,KATHrine E 07/03/2011, 12:30 PM Pager: [phone]

## 2011-07-03 NOTE — Progress Notes (Addendum)
Subjective: Pt is feeling better today but states that she had some difficulty being able to participate with PT secondary to pain.  Otherwise has no other complaints.  Denies any fever, chills, N, V Objective: Filed Vitals:   07/02/11 2218 07/03/11 0000 07/03/11 0405 07/03/11 0608  BP: 136/58   133/65  Pulse: 73   74  Temp: 99.6 F (37.6 C)   99.1 F (37.3 C)  TempSrc: Oral   Oral  Resp: 20 18 18 20   Height:      Weight:      SpO2: 98% 99% 95% 100%   Weight change:   Intake/Output Summary (Last 24 hours) at 07/03/11 1410 Last data filed at 07/03/11 1300  Gross per 24 hour  Intake 1921.67 ml  Output   1300 ml  Net 621.67 ml    General: Alert, awake, oriented x3, in no acute distress.  HEENT: No bruits, no goiter.  Heart: Regular rate and rhythm, without murmurs, rubs, gallops.  Lungs: CTA BL Abdomen: Soft, nontender, nondistended, positive bowel sounds.  Neuro: Pt responds to questions appropriately Ext: right leg scd in place, no edema   Lab Results:  [facility] 07/02/11 0742 07/01/11 2130  NA 136 138  K 3.9 3.9  CL 102 103  CO2 26 26  GLUCOSE 111* 128*  BUN 15 20  CREATININE 0.73 0.76  CALCIUM 8.8 9.2  MG -- --  PHOS -- --   No results found for this basename: AST:2,ALT:2,ALKPHOS:2,BILITOT:2,PROT:2,ALBUMIN:2 in the last 72 hours No results found for this basename: LIPASE:2,AMYLASE:2 in the last 72 hours  Basename 07/03/11 0455 07/02/11 0742 07/01/11 2130  WBC 6.5 7.3 --  NEUTROABS -- -- 8.6*  HGB 9.5* 12.1 --  HCT 28.2* 35.8* --  MCV 85.2 84.4 --  PLT 129* 174 --   No results found for this basename: CKTOTAL:3,CKMB:3,CKMBINDEX:3,TROPONINI:3 in the last 72 hours No components found with this basename: POCBNP:3 No results found for this basename: DDIMER:2 in the last 72 hours No results found for this basename: HGBA1C:2 in the last 72 hours No results found for this basename: CHOL:2,HDL:2,LDLCALC:2,TRIG:2,CHOLHDL:2,LDLDIRECT:2 in the last 72 hours No  results found for this basename: TSH,T4TOTAL,FREET3,T3FREE,THYROIDAB in the last 72 hours No results found for this basename: VITAMINB12:2,FOLATE:2,FERRITIN:2,TIBC:2,IRON:2,RETICCTPCT:2 in the last 72 hours  Micro Results: Recent Results (from the past 240 hour(s))  URINE CULTURE     Status: Normal   Collection Time   07/01/11 10:06 PM      Component Value Range Status Comment   Specimen Description URINE, CATHETERIZED   Final    Special Requests NONE   Final    Setup Time [id number]   Final    Colony Count NO GROWTH   Final    Culture NO GROWTH   Final    Report Status 07/03/2011 FINAL   Final   SURGICAL PCR SCREEN     Status: Abnormal   Collection Time   07/02/11  8:33 AM      Component Value Range Status Comment   MRSA, PCR NEGATIVE  NEGATIVE  Final    Staphylococcus aureus SAMPLE NOT SCREENED FOR [facility] (*) NEGATIVE  Final     Studies/Results: Dg Chest 1 View  07/01/2011  *RADIOLOGY REPORT*  Clinical Data: The patient fell today.  Left hip fracture.  CHEST - 1 VIEW  Comparison: 07/12/2010  Findings: Stable appearance of cardiac pacemaker since previous study.  Mild cardiac enlargement with normal pulmonary vascularity. Slight fibrosis in the lung bases.  No focal  airspace consolidation in the lungs.  No blunting of costophrenic angles.  The thorax. Calcification and torsion of the aorta.  Degenerative changes in the spine and shoulders.  Overall stable appearance of the chest since previous study.  IMPRESSION: Cardiac enlargement.  No evidence of active pulmonary disease.  Original Report Authenticated By: Marlon Pel, M.D.   Dg Hip Complete Left  07/01/2011  *RADIOLOGY REPORT*  Clinical Data: Larey Seat and injured left hip.  LEFT HIP - COMPLETE 2+ VIEW 07/01/2011:  Comparison: None.  Findings: Comminuted subcapital left femoral neck fracture.  Hip joint anatomically aligned with moderate axial joint space narrowing.  No other fractures.  Included AP pelvis demonstrates an  intact contralateral right hip with symmetric moderate axial joint space narrowing.  Sacroiliac joints and symphysis pubis intact.  Degenerative changes involving the visualized lower lumbar spine.  Osteopenia.  IMPRESSION: Comminuted subcapital left femoral neck fracture.  Moderate osteoarthritis.  Original Report Authenticated By: Arnell Sieving, M.D.   Dg Pelvis Portable  07/02/2011  *RADIOLOGY REPORT*  Clinical Data: Postop  PORTABLE PELVIS  Comparison: 07/01/2011  Findings: Left hip hemiarthroplasty has been placed.  Anatomic alignment of the osseous and prosthetic structures.  No breakage or loosening of the hardware.  IMPRESSION: Left hip hemiarthroplasty anatomically aligned.  Original Report Authenticated By: Donavan Burnet, M.D.   Dg Hip Portable 1 View Left  07/02/2011  *RADIOLOGY REPORT*  Clinical Data: Postop  PORTABLE LEFT HIP - 1 VIEW  Comparison: Yesterday  Findings: Left hip hemiarthroplasty placed.  Anatomic alignment of the osseous and prosthetic structures.  No breakage or loosening of the hardware.  IMPRESSION: Left hip hemiarthroplasty anatomically aligned.  Original Report Authenticated By: Donavan Burnet, M.D.    Medications: I have reviewed the patient's current medications.   Patient Active Hospital Problem List: Fracture, femur (07/02/2011)  Ortho managing.  Will plan on following up with their recommendations and would appreciate input on disposition.  Pt was unable to participate in PT today.  On review patient was not given prn pain medication prior.  Will discuss with nurse  SSS (sick sinus syndrome) ()  Stable, HR has been within normal limits.   GOUT (07/06/2010)   Stable, pt on allopurinol  HYPOTHYROIDISM (07/06/2010) Stable, will continue home dose of synthroid.  HYPERTENSION (07/06/2010)   Blood pressure currently 133/65 will continue to monitor.  Gerd: Pt on protonix  Anemia: Chronic with some acute blood loss post surgery.  Currently  stable.      LOS: 2 days   Penny Pia M.D.  [facility] 07/03/2011, 2:10 PM

## 2011-07-03 NOTE — Progress Notes (Signed)
07/03/11  0600 Pt begins to c/o hallucinations. States she sees kittens in the hallway. Pt is aware that she is hallucinating and calls her to daughter in-law to inform her of the same and states she is scared.Contacted on-call MD- no new orders received.

## 2011-07-03 NOTE — Progress Notes (Signed)
ANTICOAGULATION CONSULT NOTE - Follow Up Consult  Pharmacy Consult for Warfarin Indication: VTE Prophy s/p hip Fx repair  Allergies  Allergen Reactions  . Aspirin   . Penicillins Rash    Patient Measurements: Height: 5\' 4"  (162.6 cm) Weight: 141 lb 15.6 oz (64.4 kg) IBW/kg (Calculated) : 54.7   Vital Signs: Temp: 99.1 F (37.3 C) (12/10 0608) Temp src: Oral (12/10 0608) BP: 133/65 mmHg (12/10 0608) Pulse Rate: 74  (12/10 0608)  Labs:  Basename 07/03/11 0455 07/02/11 0742 07/01/11 2130  HGB 9.5* 12.1 --  HCT 28.2* 35.8* 38.7  PLT 129* 174 184  APTT -- -- --  LABPROT 17.0* -- --  INR 1.36 -- --  HEPARINUNFRC -- -- --  CREATININE -- 0.73 0.76  CKTOTAL -- -- --  CKMB -- -- --  TROPONINI -- -- --   Estimated Creatinine Clearance: 38.7 ml/min (by C-G formula based on Cr of 0.73).   Medications:  Scheduled:    . allopurinol  100 mg Oral Daily  . ALPRAZolam  0.25 mg Oral QHS  . Boost Plus  237 mL Oral BID BM  . citalopram  10 mg Oral Daily  . diltiazem  180 mg Oral Daily  . enoxaparin  40 mg Subcutaneous Q24H  . fentaNYL      . folic acid  1 mg Oral Daily  . levothyroxine  112 mcg Oral QAC breakfast  . pantoprazole  40 mg Oral Daily  . warfarin  5 mg Oral Once  . DISCONTD: ENSURE  1 Can Oral BID   Infusions:    . dextrose 5 % and 0.45 % NaCl with KCl 20 mEq/L 100 mL/hr (07/03/11 1155)  . lactated ringers     PRN: acetaminophen, acetaminophen, alum & mag hydroxide-simeth, HYDROcodone-acetaminophen, metoCLOPramide (REGLAN) injection, metoCLOPramide, morphine, ondansetron (ZOFRAN) IV, ondansetron, oxyCODONE, zolpidem  Assessment: 77 yo F s/p hip repair on 12/9. On warfarin and Lovenox 40mg  SQ q24h. Using conservative warfarin dosing given age. No bleeding reported in chart notes.  Goal of Therapy:  INR 2-3   Plan:  1)  Warfarin 4mg  PO x1 2)  F/U daily INR trend  Annia Belt 07/03/2011,2:42 PM

## 2011-07-03 NOTE — Plan of Care (Signed)
Problem: Phase II Progression Outcomes Goal: Bed to chair Outcome: Not Progressing Pt unable to perform bed mobility 2* increased pain with L LE movement

## 2011-07-03 NOTE — Progress Notes (Signed)
Nutrition Brief Note  Diet: Regular, intake 90% of breakfast today, 75% of dinner last night.   - Pt currently eating well, family requested Ensure be changed to Boost per pt preference, supplements changed. No nutrition diagnosis at this time, pt eating excellent. Will monitor.   Pager# [phone]

## 2011-07-04 ENCOUNTER — Encounter (HOSPITAL_COMMUNITY): Payer: Self-pay | Admitting: Orthopedic Surgery

## 2011-07-04 DIAGNOSIS — S72009A Fracture of unspecified part of neck of unspecified femur, initial encounter for closed fracture: Secondary | ICD-10-CM

## 2011-07-04 DIAGNOSIS — W19XXXA Unspecified fall, initial encounter: Secondary | ICD-10-CM

## 2011-07-04 LAB — CBC
Hemoglobin: 9.1 g/dL — ABNORMAL LOW (ref 12.0–15.0)
MCH: 28.8 pg (ref 26.0–34.0)
RBC: 3.16 MIL/uL — ABNORMAL LOW (ref 3.87–5.11)

## 2011-07-04 LAB — PROTIME-INR: Prothrombin Time: 24.5 seconds — ABNORMAL HIGH (ref 11.6–15.2)

## 2011-07-04 MED ORDER — SODIUM CHLORIDE 0.9 % IV SOLN
INTRAVENOUS | Status: DC
  Administered 2011-07-04: 22:00:00 via INTRAVENOUS

## 2011-07-04 MED ORDER — METOPROLOL TARTRATE 1 MG/ML IV SOLN
INTRAVENOUS | Status: AC
  Filled 2011-07-04: qty 5

## 2011-07-04 NOTE — Progress Notes (Signed)
ANTICOAGULATION CONSULT NOTE - Follow Up Consult  Pharmacy Consult for Warfarin Indication: VTE Prophy s/p hip Fx repair  Allergies  Allergen Reactions  . Aspirin   . Penicillins Rash    Patient Measurements: Height: 5\' 4"  (162.6 cm) Weight: 141 lb 15.6 oz (64.4 kg) IBW/kg (Calculated) : 54.7   Vital Signs: Temp: 98.2 F (36.8 C) (12/11 1401) Temp src: Oral (12/11 1401) BP: 94/51 mmHg (12/11 1401) Pulse Rate: 63  (12/11 1401)  Labs:  Basename 07/04/11 0455 07/03/11 0455 07/02/11 0742 07/01/11 2130  HGB 9.1* 9.5* -- --  HCT 26.8* 28.2* 35.8* --  PLT 122* 129* 174 --  APTT -- -- -- --  LABPROT 24.5* 17.0* -- --  INR 2.16* 1.36 -- --  HEPARINUNFRC -- -- -- --  CREATININE -- -- 0.73 0.76  CKTOTAL -- -- -- --  CKMB -- -- -- --  TROPONINI -- -- -- --   Estimated Creatinine Clearance: 38.7 ml/min (by C-G formula based on Cr of 0.73).   Medications:  Scheduled:     . allopurinol  100 mg Oral Daily  . ALPRAZolam  0.25 mg Oral QHS  . Boost Plus  237 mL Oral BID BM  . citalopram  10 mg Oral Daily  . coumadin book   Does not apply Once  . diltiazem  180 mg Oral Daily  . enoxaparin  40 mg Subcutaneous Q24H  . folic acid  1 mg Oral Daily  . levothyroxine  112 mcg Oral QAC breakfast  . pantoprazole  40 mg Oral Daily  . warfarin  4 mg Oral ONCE-1800  . warfarin   Does not apply Once   Infusions:     . dextrose 5 % and 0.45 % NaCl with KCl 20 mEq/L 100 mL/hr at 07/04/11 0753  . lactated ringers     PRN: acetaminophen, acetaminophen, alum & mag hydroxide-simeth, HYDROcodone-acetaminophen, metoCLOPramide (REGLAN) injection, metoCLOPramide, morphine, ondansetron (ZOFRAN) IV, ondansetron, oxyCODONE, zolpidem  Assessment: 77 yo F s/p hip repair on 12/9. On warfarin and Lovenox 40mg  SQ q24h. Using conservative warfarin dosing given age. INR jumped up overnight from 1.36 to 2.16, now in goal range 2-3. Has has warfarin 5mg  x1 and 4mg x1 so far. No bleeding reported in chart  notes.  Goal of Therapy:  INR 2-3   Plan:  1)  No warfarin tonight. Slow INR progression. 2)  F/U daily INR trend 3)  Suggest d/c'ing Lovenox (MD must write order)  Annia Belt 07/04/2011,2:38 PM

## 2011-07-04 NOTE — Progress Notes (Signed)
UR review completed. mp 

## 2011-07-04 NOTE — Progress Notes (Signed)
Subjective: Pt doing better.  There were some complaints about hallucinations but they seem to be only at night.  Otherwise no acute issues. Objective: Filed Vitals:   07/04/11 0000 07/04/11 0357 07/04/11 0615 07/04/11 1401  BP:   123/60 94/51  Pulse:   69 63  Temp:   98.2 F (36.8 C) 98.2 F (36.8 C)  TempSrc:   Oral Oral  Resp: 18 18 20 20   Height:      Weight:      SpO2: 97% 98% 100% 98%   Weight change:   Intake/Output Summary (Last 24 hours) at 07/04/11 1918 Last data filed at 07/04/11 1700  Gross per 24 hour  Intake   1180 ml  Output   2075 ml  Net   -895 ml    General: Alert, awake, oriented x3, in no acute distress.  HEENT: No bruits, no goiter.  Heart: Regular rate and rhythm, without murmurs, rubs, gallops.  Lungs: CTA BL  Abdomen: Soft, nontender, nondistended, positive bowel sounds.  Neuro: Pt responds to questions appropriately  Ext: right leg scd in place, no edema   Lab Results:  [facility] 07/02/11 0742 07/01/11 2130  NA 136 138  K 3.9 3.9  CL 102 103  CO2 26 26  GLUCOSE 111* 128*  BUN 15 20  CREATININE 0.73 0.76  CALCIUM 8.8 9.2  MG -- --  PHOS -- --   No results found for this basename: AST:2,ALT:2,ALKPHOS:2,BILITOT:2,PROT:2,ALBUMIN:2 in the last 72 hours No results found for this basename: LIPASE:2,AMYLASE:2 in the last 72 hours  Basename 07/04/11 0455 07/03/11 0455 07/01/11 2130  WBC 7.8 6.5 --  NEUTROABS -- -- 8.6*  HGB 9.1* 9.5* --  HCT 26.8* 28.2* --  MCV 84.8 85.2 --  PLT 122* 129* --   No results found for this basename: CKTOTAL:3,CKMB:3,CKMBINDEX:3,TROPONINI:3 in the last 72 hours No components found with this basename: POCBNP:3 No results found for this basename: DDIMER:2 in the last 72 hours No results found for this basename: HGBA1C:2 in the last 72 hours No results found for this basename: CHOL:2,HDL:2,LDLCALC:2,TRIG:2,CHOLHDL:2,LDLDIRECT:2 in the last 72 hours No results found for this basename:  TSH,T4TOTAL,FREET3,T3FREE,THYROIDAB in the last 72 hours No results found for this basename: VITAMINB12:2,FOLATE:2,FERRITIN:2,TIBC:2,IRON:2,RETICCTPCT:2 in the last 72 hours  Micro Results: Recent Results (from the past 240 hour(s))  URINE CULTURE     Status: Normal   Collection Time   07/01/11 10:06 PM      Component Value Range Status Comment   Specimen Description URINE, CATHETERIZED   Final    Special Requests NONE   Final    Setup Time [id number]   Final    Colony Count NO GROWTH   Final    Culture NO GROWTH   Final    Report Status 07/03/2011 FINAL   Final   SURGICAL PCR SCREEN     Status: Abnormal   Collection Time   07/02/11  8:33 AM      Component Value Range Status Comment   MRSA, PCR NEGATIVE  NEGATIVE  Final    Staphylococcus aureus SAMPLE NOT SCREENED FOR [facility] (*) NEGATIVE  Final     Studies/Results: No results found.  Medications: I have reviewed the patient's current medications.  Fracture, femur (07/02/2011) Ortho input seen and appreciated.  Spoke with Child psychotherapist and case manager regarding CIR and placement.  Per PT note patient tolerated pt therapy well today as opposed to yesterday.  Hallucinations at night: given age I suspect patient is sundowning.  She  is afebrile and WBC within normal limits.  Should she spike a temp I would work up for source of infection and start abx's.  SSS (sick sinus syndrome) () Stable, HR has been within normal limits.   GOUT (07/06/2010) Stable, pt on allopurinol   HYPOTHYROIDISM (07/06/2010) Stable, will continue home dose of synthroid.   HYPERTENSION (07/06/2010) Fluctuated today.  Will plan on giving fluids.  Anemia: Chronic with some acute blood loss post surgery. Currently stable.      LOS: 3 days   Penny Pia M.D.  [facility] 07/04/2011, 7:18 PM

## 2011-07-04 NOTE — Progress Notes (Signed)
Ortho Tech made aware of the need for overhead trapeze but stated that  It is not compatible for the   bed that the patient is on, and that we need an ortho bed for them to place an overhead trapeze. Spoke  with PT and said will inform nursing if patient will need it or not after their evaluation.

## 2011-07-04 NOTE — Progress Notes (Signed)
CSW met with patient and family at bedside. Discussed need for SNF, Patient and family requesting [facility]. Physical therapist states that patient would be appropriate for Inpatient rehab. CSW gave patient SNF list and recommended that they choose a back up plan in case there is no bed available at CIR. CSW to follow. Abel Ra C. Derry Kassel MSW, LCSW [phone]

## 2011-07-04 NOTE — Progress Notes (Signed)
Physical Therapy Treatment Patient Details Name: [name] MRN: [id number] DOB: [birth year] Today's Date: 07/04/2011 Time: 2841-3244 Charge: TA PT Assessment/Plan  PT - Assessment/Plan Comments on Treatment Session: Pt did somewhat better with transfer back to bed this afternoon. PT Plan: Discharge plan remains appropriate;Frequency remains appropriate Follow Up Recommendations: Inpatient Rehab Equipment Recommended: Defer to next venue PT Goals  Acute Rehab PT Goals PT Goal: Supine/Side to Sit - Progress: Progressing toward goal PT Goal: Sit to Stand - Progress: Progressing toward goal PT Transfer Goal: Bed to Chair/Chair to Bed - Progress: Progressing toward goal PT Goal: Ambulate - Progress: Progressing toward goal  PT Treatment Precautions/Restrictions  Precautions Precautions: Posterior Hip Required Braces or Orthoses: Yes Knee Immobilizer: On except when in CPM (KI in bed unless CPM) Restrictions Weight Bearing Restrictions: Yes LLE Weight Bearing: Weight bearing as tolerated Other Position/Activity Restrictions: WBAT per orders Mobility (including Balance) Bed Mobility Bed Mobility: Yes Sit to Supine - Left: 1: +2 Total assist;HOB flat Sit to Supine - Left Details (indicate cue type and reason): verbal cues for technique, assist for trunk and bringing bilateral LEs on to bed, pt=40% Transfers Transfers: Yes Sit to Stand: 1: +2 Total assist;With armrests;From chair/3-in-1 Sit to Stand Details (indicate cue type and reason): pt=60%, verbal cues for hand placement, assist for weakness and shifting weight forward over feet (posterior lean present) Stand to Sit: 1: +2 Total assist;With upper extremity assist;To bed Stand to Sit Details: pt=50%, assist to control descent, verbal and manual cue for L LE forward   Exercise    End of Session PT - End of Session Equipment Utilized During Treatment: Gait belt Activity Tolerance: Patient tolerated treatment  well Patient left: in bed;with call bell in reach;with family/visitor present Nurse Communication: Need for lift equipment General Behavior During Session: [facility] for tasks performed Cognition: Impaired  Aprille Sawhney,KATHrine E 07/04/2011, 4:29 PM Pager: [phone]

## 2011-07-04 NOTE — Progress Notes (Signed)
Occupational Therapy Evaluation Patient Details Name: [name] MRN: [id number] DOB: 04-Mar-1917 Today's Date: 07/04/2011 Time in: 9:55 am Time out: 10:22 am Eval II  Problem List:  Patient Active Problem List  Diagnoses  . Chest pain, unspecified  . Small bowel obstruction  . Pacemaker  . Depression  . AV block  . Recurrent UTI  . SSS (sick sinus syndrome)  . GOUT  . GERD (gastroesophageal reflux disease)  . DVT (deep venous thrombosis)  . HYPOTHYROIDISM  . HYPOKALEMIA  . ANEMIA  . HYPERTENSION  . Fracture, femur    Past Medical History:  Past Medical History  Diagnosis Date  . Complete heart block     s/p PPM by Dr Amil Amen 2004, recent Gen Change  07/13/10  . Small bowel obstruction   . Depression   . Hypertension   . Hypokalemia   . Recurrent UTI   . Gout   . GERD (gastroesophageal reflux disease)   . DVT (deep venous thrombosis)    Past Surgical History:  Past Surgical History  Procedure Date  . Insert / replace / remove pacemaker     initial pacemaker by Dr Amil Amen 2004, replaced by [facility] for ERI 07/13/10    OT Assessment/Plan/Recommendation OT Assessment Clinical Impression Statement: Patient will benefit from OT services to increase her independence with ADL for next venue of care. OT Recommendation/Assessment: Patient will need skilled OT in the acute care venue OT Problem List: Decreased strength;Decreased knowledge of precautions;Decreased knowledge of use of DME or AE;Decreased safety awareness OT Therapy Diagnosis : Generalized weakness OT Plan OT Frequency: Min 1X/week OT Treatment/Interventions: Self-care/ADL training;Therapeutic activities;DME and/or AE instruction;Patient/family education OT Recommendation Follow Up Recommendations: Inpatient Rehab;Skilled nursing facility;Other (comment) (family hoping for CIR) Equipment Recommended: Defer to next venue Individuals Consulted Consulted and Agree with Results and Recommendations: Family  member/caregiver;Patient Family Member Consulted: grandson OT Goals Acute Rehab OT Goals OT Goal Formulation: With patient/family Time For Goal Achievement: 2 weeks ADL Goals Pt Will Perform Grooming: Sitting, edge of bed;Sitting, chair;Unsupported;with supervision ADL Goal: Grooming - Progress: Progressing toward goals Pt Will Perform Lower Body Bathing: with mod assist;with adaptive equipment;Sit to stand from chair;Sit to stand from bed ADL Goal: Lower Body Bathing - Progress: Progressing toward goals Pt Will Perform Lower Body Dressing: with mod assist;with adaptive equipment;Sit to stand from chair;Sit to stand from bed ADL Goal: Lower Body Dressing - Progress: Progressing toward goals Pt Will Transfer to Toilet: with mod assist;with DME;3-in-1;Stand pivot transfer;Other (comment) (rolling walker) ADL Goal: Toilet Transfer - Progress: Progressing toward goals Pt Will Perform Toileting - Clothing Manipulation: with mod assist;Standing ADL Goal: Toileting - Clothing Manipulation - Progress: Progressing toward goals Pt Will Perform Toileting - Hygiene: with mod assist;Standing at 3-in-1/toilet ADL Goal: Toileting - Hygiene - Progress: Progressing toward goals  OT Evaluation Precautions/Restrictions  Precautions Precautions: Posterior Hip Required Braces or Orthoses: Yes Knee Immobilizer: On except when in CPM;Other (comment) (order states KI in bed unless in CPM) Restrictions Weight Bearing Restrictions: Yes LLE Weight Bearing: Weight bearing as tolerated Other Position/Activity Restrictions: WBAT per orders Prior Functioning Home Living Lives With: Alone Receives Help From: Personal care attendant (CNA during the day) Type of Home: House Bathroom Shower/Tub: Engineer, manufacturing systems: Standard Home Adaptive Equipment: Shower chair with back;Walker - rolling Additional Comments: Sister and son live next door.  CNA came in room toward end of session (wearing CAM  boot). Prior Function Level of Independence: Requires assistive device for independence;Needs assistance with ADLs;Needs assistance  with homemaking Bath: Minimal Toileting: Other (comment) (modified independent with toileting) Dressing: Other (comment) (modified independent with AE) Meal Prep: Other (comment) (per grandson, has assist) Light Housekeeping: Other (comment) (has assist) ADL ADL Eating/Feeding: Simulated;Independent Where Assessed - Eating/Feeding: Chair Grooming: Performed;Denture care;Minimal assistance Grooming Details (indicate cue type and reason): pt with trembly with both hands. Needs assist to stabilize dentures to rinse and apply adhesive Where Assessed - Grooming: Sitting, chair Upper Body Bathing: Simulated;Chest;Right arm;Left arm;Abdomen;Set up;Supervision/safety Where Assessed - Upper Body Bathing: Supported;Sitting, chair Lower Body Bathing: Maximal assistance Lower Body Bathing Details (indicate cue type and reason): excluding periareas in a seated position without adaptive equipment Where Assessed - Lower Body Bathing: Sitting, chair Upper Body Dressing: Simulated;Minimal assistance Where Assessed - Upper Body Dressing: Sitting, chair;Supported Lower Body Dressing: +1 Total assistance Lower Body Dressing Details (indicate cue type and reason): without adaptive equipment. seated only. used reacher to doff right sock with mod assist. Roe Coombs right sock with SA with max assist Where Assessed - Lower Body Dressing: Sitting, chair Toilet Transfer: Not assessed Toilet Transfer Method: Not assessed Toileting - Clothing Manipulation: Not assessed Toileting - Hygiene: Not assessed Tub/Shower Transfer: Not assessed Equipment Used: Reacher;Sock aid ADL Comments: She owns a reacher and shoe horn but has not used a sock aid before until today. Educated grandson on where to obtain sock aid and LHS. Vision/Perception  Vision - History Baseline Vision: Wears glasses all  the time Cognition Cognition Arousal/Alertness: Awake/alert Overall Cognitive Status: Impaired Memory: Decreased recall of precautions Memory Deficits: Reviewed THPs with patient. Orientation Level: Oriented to person;Disoriented to place;Other (Comment) (patient thought she was at home in her bedroom) Safety/Judgement: Decreased awareness of safety precautions Decreased Safety/Judgement: Decreased awareness of need for assistance Safety/Judgement - Other Comments: was trying to lean forward to get out of chair to go down the hall and see "the man that just left." Cognition - Other Comments: Grandson reports hallucinations and OT notes patient stating that she needs to go find the man that walked down the hall. Sensation/Coordination Sensation Light Touch: Appears Intact Extremity Assessment RUE Assessment RUE Assessment: Within Functional Limits LUE Assessment LUE Assessment: Within Functional Limits Mobility    Exercises   End of Session OT - End of Session Equipment Utilized During Treatment: Other (comment) (adaptive equipment) Activity Tolerance: Patient tolerated treatment well Patient left: in chair;with call bell in reach;with family/visitor present General Behavior During Session: Other (comment) (except trying to get up out of chair on her own) Cognition: Impaired (hallucinations and disorientation to place)   Lennox Laity [phone] 07/04/2011, 11:18 AM

## 2011-07-04 NOTE — Clinical Documentation Improvement (Signed)
Anemia Blood Loss Clarification  THIS DOCUMENT IS NOT A PERMANENT PART OF THE MEDICAL RECORD  RESPOND TO THE THIS QUERY, FOLLOW THE INSTRUCTIONS BELOW:  1. If needed, update documentation for the patient's encounter via the notes activity.  2. Access this query again and click edit on the [employer].  3. After updating, or not, click F2 to complete all highlighted (required) fields concerning your review. Select "additional documentation in the medical record" OR "no additional documentation provided".  4. Click Sign note button.  5. The deficiency will fall out of your InBasket *Please let us know if you are not able to compete this workflow by phone or e-mail (listed below).        07/04/11  Dear Dr.Ariann Khaimov Marton Redwood  In an effort to better capture your patient's severity of illness, reflect appropriate length of stay and utilization of resources, a review of the patient medical record has revealed the following indicators.    Based on your clinical judgment, please clarify and document in a progress note and/or discharge summary the clinical condition associated with the following supporting information:  In responding to this query please exercise your independent judgment.  The fact that a query is asked, does not imply that any particular answer is desired or expected.  Possible Clinical Conditions?   According to lab pt's H/H= 9.1/26.8 post op. Please clarify based on abnormal H/H whether or not abn H/H can be further specified as one of the diagnoses listed below and document in pn or d/c summary.  " Expected Acute Blood Loss Anemia  " Acute Blood Loss Anemia  " Acute on chronic blood loss anemia   " Other Condition________________  " Cannot Clinically Determine  Risk Factors: (recent surgery, pre op anemia, EBL in OR)  Supporting Information:  Signs and Symptoms (unable to ambulate, weakness, dizziness, unable to participate in care)  Diagnostics:  Results  for KAILEE, ESSMAN (MRN [id number]) as of 07/04/2011 15:09  Ref. Range 07/03/2011 04:55 07/04/2011 04:55  HGB Latest Range: 12.0-15.0 g/dL 9.5 (L) 9.1 (L)  HCT Latest Range: 36.0-46.0 % 28.2 (L) 26.8 (L)   Treatments                lactated ringers infusion folic acid (FOLVITE) tablet 1 mg   Reviewed: additional documentation in the medical record  Thank You,  Enis Slipper RN, BSN, CCDS Clinical Documentation Specialist [facility] Dept Pager: [phone] / E-mail: [email or web address]   [facility] [place]    I have updated my PN Cena Benton

## 2011-07-04 NOTE — Progress Notes (Signed)
Subjective: 2 Days Post-Op Procedure(s) (LRB): ARTHROPLASTY BIPOLAR HIP (Left) Patient reports pain as moderate..    Objective: Vital signs in last 24 hours: Temp:  [98.1 F (36.7 C)-98.4 F (36.9 C)] 98.2 F (36.8 C) (12/11 0615) Pulse Rate:  [65-76] 69  (12/11 0615) Resp:  [18-20] 20  (12/11 0615) BP: (102-129)/(54-61) 123/60 mmHg (12/11 0615) SpO2:  [96 %-100 %] 100 % (12/11 0615)  Intake/Output from previous day: 12/10 0701 - 12/11 0700 In: 160 [P.O.:160] Out: 3075 [Urine:3075] Intake/Output this shift: Total I/O In: -  Out: 550 [Urine:550]   Basename 07/04/11 0455 07/03/11 0455 07/02/11 0742 07/01/11 2130  HGB 9.1* 9.5* 12.1 13.3    Basename 07/04/11 0455 07/03/11 0455  WBC 7.8 6.5  RBC 3.16* 3.31*  HCT 26.8* 28.2*  PLT 122* 129*    Basename 07/02/11 0742 07/01/11 2130  NA 136 138  K 3.9 3.9  CL 102 103  CO2 26 26  BUN 15 20  CREATININE 0.73 0.76  GLUCOSE 111* 128*  CALCIUM 8.8 9.2    Basename 07/04/11 0455 07/03/11 0455  LABPT -- --  INR 2.16* 1.36    Left hip dressing benign. N-V intact to left lower ext.  Assessment/Plan: 2 Days Post-Op Procedure(s) (LRB): ARTHROPLASTY BIPOLAR HIP (Left) Up with therapy Cont lovenox. CIR when bed available. Will follow. [place] Nonaka G 07/04/2011, 1:56 PM

## 2011-07-04 NOTE — Consult Note (Signed)
Physical Medicine and Rehabilitation Consult Reason for Consult: Left femur fracture. Referring Phsyician: Dr. Cena Benton    HPI: [name] is an 77 y.o. female who fell backwards while walking without her walker and sustained left femur fracture on 12/09.  Patient evaluated by Dr. Luiz Blare and underwent left hip bipolar arthroplasty on the same day.  Post op WBAT and on coumadin/lovenox bridge for DVT prophylaxis.  Patient has had problems with confusion and hallucinations.   PT eval done yesterday and patient unable to perform bed mobility due to increased pain with LLE movement but up in a chair today.  MD, patient and family requesting CIR (as patient did well during/past  her CIR stay last year) Review of Systems  Constitutional: Negative for malaise/fatigue and diaphoresis.  HENT: Positive for hearing loss.   Eyes: Negative for blurred vision and double vision.  Respiratory: Negative for shortness of breath.   Cardiovascular: Negative for chest pain, palpitations and orthopnea.  Gastrointestinal: Negative.   Genitourinary: Negative.   Musculoskeletal: Positive for joint pain.       Reports LLE pain with activity.  Neurological: Positive for tremors. Negative for dizziness and focal weakness.  Psychiatric/Behavioral: Positive for hallucinations. Negative for memory loss.       Confusion resolved. Intermittent hallucinations continue per family.   Past Medical History  Diagnosis Date  . Complete heart block     s/p PPM by Dr Amil Amen 2004, recent Gen Change  07/13/10  . Small bowel obstruction   . Depression    Cervical myelopathy/SCI 08/11   . Hypertension   . Hypokalemia    Duodenal ulcer   . Recurrent UTI   . Gout   . GERD (gastroesophageal reflux disease)    Chronic gait disorder   . DVT (deep venous thrombosis)    Past Surgical History  Procedure Date  . Insert / replace / remove pacemaker     initial pacemaker by Dr Amil Amen 2004, replaced by [facility] for ERI 07/13/10    Family history: noncontributory due to advanced age.  Social History:Lives alone and independent PTA.  She reports  quit smoking about 30 years ago. She does not have any smokeless tobacco history on file. She reports that she does not drink alcohol or use illicit drugs.  Multiple family members live around her and can provide 24 hours assist past discharge.  Active and does a home exercise program daily.   Allergies  Allergen Reactions  . Aspirin   . Penicillins Rash    Prior to Admission medications   Medication Sig Start Date End Date Taking? Authorizing Provider  acetaminophen (TYLENOL) 325 MG tablet Take 650 mg by mouth every 4 (four) hours as needed. For pain.    Yes Historical Provider, MD  allopurinol (ZYLOPRIM) 100 MG tablet Take 100 mg by mouth daily.     Yes Historical Provider, MD  ALPRAZolam Prudy Feeler) 0.5 MG tablet Take 0.25 mg by mouth at bedtime.     Yes Historical Provider, MD  citalopram (CELEXA) 10 MG tablet Take 10 mg by mouth daily.     Yes Historical Provider, MD  diltiazem (DILACOR XR) 180 MG 24 hr capsule Take 180 mg by mouth daily.     Yes Historical Provider, MD  ENSURE (ENSURE) Take 237 mLs by mouth 2 (two) times daily.     Yes Historical Provider, MD  folic acid (FOLVITE) 1 MG tablet Take 1 mg by mouth daily.     Yes Historical Provider, MD  levothyroxine (SYNTHROID)  112 MCG tablet Take 1 tablet (112 mcg total) by mouth daily. 08/29/10 08/29/11 Yes Elyn Aquas., MD  Oxybutynin Chloride (GELNIQUE TD) Place 1 packet onto the skin daily.    Yes Historical Provider, MD  pantoprazole (PROTONIX) 40 MG tablet Take 40 mg by mouth daily.     Yes Historical Provider, MD  POLYSACCHARIDE IRON (IRON POLYSACCHARIDES) 150 MG CAPS Take 150 mg by mouth 2 (two) times daily.    Yes Historical Provider, MD   Scheduled Medications:    . allopurinol  100 mg Oral Daily  . ALPRAZolam  0.25 mg Oral QHS  . Boost Plus  237 mL Oral BID BM  . citalopram  10 mg Oral Daily  . coumadin  book   Does not apply Once  . diltiazem  180 mg Oral Daily  . enoxaparin  40 mg Subcutaneous Q24H  . folic acid  1 mg Oral Daily  . levothyroxine  112 mcg Oral QAC breakfast  . pantoprazole  40 mg Oral Daily  . warfarin  4 mg Oral ONCE-1800  . warfarin   Does not apply Once  . DISCONTD: ENSURE  1 Can Oral BID   PRN MED's: acetaminophen, acetaminophen, alum & mag hydroxide-simeth, HYDROcodone-acetaminophen, metoCLOPramide (REGLAN) injection, metoCLOPramide, morphine, ondansetron (ZOFRAN) IV, ondansetron, oxyCODONE, zolpidem  Home: Home Living Lives With: Alone Receives Help From: Personal care attendant (CNA during the day) for 8 hours a day.  Type of Home: House Bathroom Shower/Tub: Engineer, manufacturing systems: Standard Home Adaptive Equipment: Shower chair with back;Walker - rolling Additional Comments: Sister and son live next door.  CNA came in room toward end of session (wearing CAM boot)-family assisting more currently due to CNA injury.  Functional History: Prior Function Level of Independence: Requires assistive device for independence;Needs assistance with ADLs;Needs assistance with homemaking Bath: Minimal Toileting: Other (comment) (modified independent with toileting) Dressing: Other (comment) (modified independent with AE) Meal Prep: Other (comment) (per grandson, has assist) Light Housekeeping: Other (comment) (has assist) Comments: Pt reports she uses RW for mobility. Functional Status:  Mobility: Bed Mobility Bed Mobility: Yes Supine to Sit: Other (comment);4: Min assist Supine to Sit Details (indicate cue type and reason): attempted to position pt diagonally in bed with minA for L LE support, but pt in too much pain to continue with transfer Scooting to [facility]: 1: +2 Total assist Scooting to [facility] Details (indicate cue type and reason): pt=20%, pt attempted to assist with UEs and verbal cues to use R LE to assist Transfers Transfers: No (Pt unable 2* increased  pain with L LE movement)      ADL: ADL Eating/Feeding: Simulated;Independent Where Assessed - Eating/Feeding: Chair Grooming: Performed;Denture care;Minimal assistance Grooming Details (indicate cue type and reason): pt with trembly with both hands. Needs assist to stabilize dentures to rinse and apply adhesive Where Assessed - Grooming: Sitting, chair Upper Body Bathing: Simulated;Chest;Right arm;Left arm;Abdomen;Set up;Supervision/safety Where Assessed - Upper Body Bathing: Supported;Sitting, chair Lower Body Bathing: Maximal assistance Lower Body Bathing Details (indicate cue type and reason): excluding periareas in a seated position without adaptive equipment Where Assessed - Lower Body Bathing: Sitting, chair Upper Body Dressing: Simulated;Minimal assistance Where Assessed - Upper Body Dressing: Sitting, chair;Supported Lower Body Dressing: +1 Total assistance Lower Body Dressing Details (indicate cue type and reason): without adaptive equipment. seated only. used reacher to doff right sock with mod assist. Don right sock with SA with max assist Where Assessed - Lower Body Dressing: Sitting, chair Toilet Transfer: Not assessed Toilet Transfer  Method: Not assessed Toileting - Clothing Manipulation: Not assessed Toileting - Hygiene: Not assessed Tub/Shower Transfer: Not assessed Equipment Used: Reacher;Sock aid ADL Comments: She owns a reacher and shoe horn but has not used a sock aid before until today. Educated grandson on where to obtain sock aid and LHS.  Cognition: Cognition Arousal/Alertness: Awake/alert Orientation Level: Oriented to person;Disoriented to place;Other (Comment) (patient thought she was at home in her bedroom) Cognition Arousal/Alertness: Awake/alert Overall Cognitive Status: Impaired Memory: Decreased recall of precautions Memory Deficits: Reviewed THPs with patient. Orientation Level: Oriented to person;Disoriented to place;Other (Comment) (patient  thought she was at home in her bedroom) Safety/Judgement: Decreased awareness of safety precautions Decreased Safety/Judgement: Decreased awareness of need for assistance Safety/Judgement - Other Comments: was trying to lean forward to get out of chair to go down the hall and see "the man that just left." Cognition - Other Comments: Grandson reports hallucinations and OT notes patient stating that she needs to go find the man that walked down the hall.  Blood pressure 123/60, pulse 69, temperature 98.2 F (36.8 C), temperature source Oral, resp. rate 20, height 5\' 4"  (1.626 m), weight 64.4 kg (141 lb 15.6 oz), SpO2 100.00%. Physical Exam  Constitutional: She appears well-developed.  HENT:  Head: Normocephalic.  Eyes: Pupils are equal, round, and reactive to light.  Neck: Normal range of motion.  Cardiovascular: Normal rate and regular rhythm.   Pulmonary/Chest: Effort normal.  Abdominal: Soft.  Musculoskeletal:       Left hip dressed and knee immobilizer in place. Left foot appears neurovascularly intact.  Neurological: She has normal reflexes.       Strength in the upper extremities grossly 4/5. Right lower extremity strength 3-4/5 at the hip 4/5 distally. Left hip and knee not tested due to being in the immobilizer. Left foot is grossly 4/5. Patient oriented to month but needed cues for day and extra time for the year. She is a bit confused overall although did remember me from the prior rehabilitation admission. Conversation was fair but she lost track quite frequently. Patient is quite pleasant and cooperative however.     Results for orders placed during the hospital encounter of 07/01/11 (from the past 24 hour(s))  PROTIME-INR     Status: Abnormal   Collection Time   07/04/11  4:55 AM      Component Value Range   Prothrombin Time 24.5 (*) 11.6 - 15.2 (seconds)   INR 2.16 (*) 0.00 - 1.49   CBC     Status: Abnormal   Collection Time   07/04/11  4:55 AM      Component Value Range    WBC 7.8  4.0 - 10.5 (K/uL)   RBC 3.16 (*) 3.87 - 5.11 (MIL/uL)   Hemoglobin 9.1 (*) 12.0 - 15.0 (g/dL)   HCT 91.4 (*) 78.2 - 46.0 (%)   MCV 84.8  78.0 - 100.0 (fL)   MCH 28.8  26.0 - 34.0 (pg)   MCHC 34.0  30.0 - 36.0 (g/dL)   RDW 95.6  21.3 - 08.6 (%)   Platelets 122 (*) 150 - 400 (K/uL)       Assessment/Plan: Diagnosis: Left femoral neck fracture status post bipolar hip arthroplasty 1. Does the need for close, 24 hr/day medical supervision in concert with the patient's rehab needs make it unreasonable for this patient to be served in a less intensive setting? Yes 2. Co-Morbidities requiring supervision/potential complications: Gout hypertension postoperative confusion history of AV block and DVT 3. Due to  bladder management, bowel management, safety, medication administration, pain management and patient education, does the patient require 24 hr/day rehab nursing? Yes 4. Does the patient require coordinated care of a physician, rehab nurse, PT (1-2 hrs/day, 5 days/week) and OT (1-2 hrs/day, 5 days/week) to address physical and functional deficits in the context of the above medical diagnosis(es)? Yes Addressing deficits in the following areas: balance, bathing, bowel/bladder control, dressing, endurance, grooming, locomotion, strength, toileting and transferring 5. Can the patient actively participate in an intensive therapy program of at least 3 hrs of therapy per day at least 5 days per week? Yes 6. The potential for patient to make measurable gains while on inpatient rehab is excellent 7. Anticipated functional outcomes upon discharge from inpatients are supervision to minimal assistance PT, minimal to moderate assist and OT 8. Estimated rehab length of stay to reach the above functional goals is: 2-2-1/2 weeks 9. Does the patient have adequate social supports to accommodate these discharge functional goals? Yes 10. Anticipated D/C setting: Home 11. Anticipated post D/C treatments:  HH therapy 12. Overall Rehab/Functional Prognosis: good  RECOMMENDATIONS: This patient's condition is appropriate for continued rehabilitative care in the following setting: CIR Patient has agreed to participate in recommended program. Yes Note that insurance prior authorization may be required for reimbursement for recommended care.  Comment: Patient has excellent social supports. She is quite motivated. She was with [place] over a year ago and did nicely. Rehabilitation nurse to followup.    07/04/2011

## 2011-07-04 NOTE — Progress Notes (Signed)
Physical Therapy Treatment Patient Details Name: KAYONA FOOR MRN: [id number] DOB: 04-29-1917 Today's Date: 07/04/2011 Time: [id number] Charge: Leonia Reeves PT Assessment/Plan  PT - Assessment/Plan Comments on Treatment Session: Pt able to transfer today.  Pt premedicated with morphine which seemed to help with pain.  Pt seems to be less orientated today, does not follow instructions consistently, and required increased processing time.   PT Plan: Discharge plan remains appropriate;Frequency remains appropriate Follow Up Recommendations: Inpatient Rehab Equipment Recommended: Defer to next venue PT Goals  Acute Rehab PT Goals PT Goal: Supine/Side to Sit - Progress: Progressing toward goal PT Goal: Sit to Stand - Progress: Progressing toward goal PT Transfer Goal: Bed to Chair/Chair to Bed - Progress: Progressing toward goal PT Goal: Ambulate - Progress: Progressing toward goal  PT Treatment Precautions/Restrictions  Precautions Precautions: Posterior Hip Required Braces or Orthoses: Yes Knee Immobilizer: On except when in CPM (KI in bed unless CPM) Restrictions Weight Bearing Restrictions: Yes LLE Weight Bearing: Weight bearing as tolerated Other Position/Activity Restrictions: WBAT per orders Mobility (including Balance) Bed Mobility Bed Mobility: Yes Supine to Sit: 1: +2 Total assist;HOB elevated (Comment degrees) Supine to Sit Details (indicate cue type and reason): pt=40%, assist required for trunk and L LE Sitting - Scoot to Edge of Bed: 2: Max assist Sitting - Scoot to [place] of Bed Details (indicate cue type and reason): assist for L hip forward Transfers Transfers: Yes Sit to Stand: From bed;1: +2 Total assist;From elevated surface Sit to Stand Details (indicate cue type and reason): pt=50%, verbal cues for hand placement, assist for weakness and shifting weight forward over feet (posterior lean present) Stand to Sit: 1: +2 Total assist;To chair/3-in-1 Stand to Sit Details: pt=  40%, increased assist 2* pain and pt upset that L LE not moving, assisted controlling descent Ambulation/Gait Ambulation/Gait: Yes Ambulation/Gait Assistance: 1: +2 Total assist Ambulation/Gait Assistance Details (indicate cue type and reason): pt=40%, pt able take 3-4 steps with constant step by step verbal cues very slowly then started allowing L LE to IR and adduct and not able to correct with verbal or tactile cues, so therapist assisted back to neutral.  pt then pivoted feet together to allow recliner to be brought behind pt. Ambulation Distance (Feet): 2 Feet Assistive device: Rolling walker Gait Pattern: Antalgic;Step-to pattern    Exercise    End of Session PT - End of Session Activity Tolerance: Patient limited by pain Patient left: in chair;with family/visitor present (with OT) General Behavior During Session: [facility] for tasks performed Cognition: Impaired (hallucinations, not orientated to place and situation)  Dacoda Finlay,KATHrine E 07/04/2011, 1:33 PM Pager: [phone]

## 2011-07-05 ENCOUNTER — Inpatient Hospital Stay (HOSPITAL_COMMUNITY)
Admission: RE | Admit: 2011-07-05 | Discharge: 2011-07-13 | DRG: 945 | Disposition: A | Discharge status: Home Health | Payer: Medicare Other | Source: Ambulatory Visit | Attending: Physical Medicine & Rehabilitation | Admitting: Physical Medicine & Rehabilitation

## 2011-07-05 DIAGNOSIS — E039 Hypothyroidism, unspecified: Secondary | ICD-10-CM | POA: Diagnosis present

## 2011-07-05 DIAGNOSIS — Z87440 Personal history of urinary (tract) infections: Secondary | ICD-10-CM

## 2011-07-05 DIAGNOSIS — R296 Repeated falls: Secondary | ICD-10-CM | POA: Diagnosis present

## 2011-07-05 DIAGNOSIS — Y998 Other external cause status: Secondary | ICD-10-CM

## 2011-07-05 DIAGNOSIS — Z5189 Encounter for other specified aftercare: Principal | ICD-10-CM

## 2011-07-05 DIAGNOSIS — M109 Gout, unspecified: Secondary | ICD-10-CM

## 2011-07-05 DIAGNOSIS — I10 Essential (primary) hypertension: Secondary | ICD-10-CM

## 2011-07-05 DIAGNOSIS — S7290XA Unspecified fracture of unspecified femur, initial encounter for closed fracture: Secondary | ICD-10-CM | POA: Diagnosis present

## 2011-07-05 DIAGNOSIS — W19XXXA Unspecified fall, initial encounter: Secondary | ICD-10-CM

## 2011-07-05 DIAGNOSIS — K5900 Constipation, unspecified: Secondary | ICD-10-CM | POA: Diagnosis present

## 2011-07-05 DIAGNOSIS — I495 Sick sinus syndrome: Secondary | ICD-10-CM | POA: Diagnosis present

## 2011-07-05 DIAGNOSIS — Z4789 Encounter for other orthopedic aftercare: Secondary | ICD-10-CM

## 2011-07-05 DIAGNOSIS — G4700 Insomnia, unspecified: Secondary | ICD-10-CM | POA: Diagnosis present

## 2011-07-05 DIAGNOSIS — D649 Anemia, unspecified: Secondary | ICD-10-CM | POA: Diagnosis present

## 2011-07-05 DIAGNOSIS — D62 Acute posthemorrhagic anemia: Secondary | ICD-10-CM

## 2011-07-05 DIAGNOSIS — S72009A Fracture of unspecified part of neck of unspecified femur, initial encounter for closed fracture: Secondary | ICD-10-CM

## 2011-07-05 DIAGNOSIS — R410 Disorientation, unspecified: Secondary | ICD-10-CM | POA: Diagnosis present

## 2011-07-05 LAB — BASIC METABOLIC PANEL
CO2: 27 mEq/L (ref 19–32)
Calcium: 8.2 mg/dL — ABNORMAL LOW (ref 8.4–10.5)
Chloride: 109 mEq/L (ref 96–112)
Glucose, Bld: 101 mg/dL — ABNORMAL HIGH (ref 70–99)
Potassium: 4.2 mEq/L (ref 3.5–5.1)
Sodium: 140 mEq/L (ref 135–145)

## 2011-07-05 LAB — CBC
Hemoglobin: 8 g/dL — ABNORMAL LOW (ref 12.0–15.0)
MCH: 28.5 pg (ref 26.0–34.0)
RBC: 2.81 MIL/uL — ABNORMAL LOW (ref 3.87–5.11)
WBC: 6.3 10*3/uL (ref 4.0–10.5)

## 2011-07-05 LAB — PROTIME-INR: Prothrombin Time: 26 seconds — ABNORMAL HIGH (ref 11.6–15.2)

## 2011-07-05 MED ORDER — GUAIFENESIN-DM 100-10 MG/5ML PO SYRP: 5.0000 mL | ORAL_SOLUTION | Freq: Four times a day (QID) | ORAL | Status: DC | PRN

## 2011-07-05 MED ORDER — PROMETHAZINE HCL 12.5 MG PO TABS: 12.5000 mg | ORAL_TABLET | Freq: Four times a day (QID) | ORAL | Status: DC | PRN

## 2011-07-05 MED ORDER — BOOST PLUS PO LIQD
237.0000 mL | Freq: Two times a day (BID) | ORAL | Status: DC
  Administered 2011-07-06 – 2011-07-12 (×13): 237 mL via ORAL
  Filled 2011-07-05 (×19): qty 237

## 2011-07-05 MED ORDER — DILTIAZEM HCL ER 180 MG PO CP24
180.0000 mg | ORAL_CAPSULE | Freq: Every day | ORAL | Status: DC
  Administered 2011-07-06 – 2011-07-13 (×8): 180 mg via ORAL
  Filled 2011-07-05 (×9): qty 1

## 2011-07-05 MED ORDER — TRAMADOL HCL 50 MG PO TABS
50.0000 mg | ORAL_TABLET | Freq: Four times a day (QID) | ORAL | Status: DC | PRN
  Administered 2011-07-10: 50 mg via ORAL

## 2011-07-05 MED ORDER — ALUM & MAG HYDROXIDE-SIMETH 400-400-40 MG/5ML PO SUSP
30.0000 mL | ORAL | Status: DC | PRN
  Filled 2011-07-05: qty 30

## 2011-07-05 MED ORDER — FLEET ENEMA 7-19 GM/118ML RE ENEM: 1.0000 | ENEMA | Freq: Once | RECTAL | Status: AC | PRN

## 2011-07-05 MED ORDER — ALPRAZOLAM 0.25 MG PO TABS
0.2500 mg | ORAL_TABLET | Freq: Every day | ORAL | Status: DC
  Administered 2011-07-05 – 2011-07-12 (×8): 0.25 mg via ORAL
  Filled 2011-07-05 (×8): qty 1

## 2011-07-05 MED ORDER — BISACODYL 10 MG RE SUPP
10.0000 mg | Freq: Every day | RECTAL | Status: DC | PRN
  Administered 2011-07-05 – 2011-07-09 (×3): 10 mg via RECTAL
  Filled 2011-07-05 (×3): qty 1

## 2011-07-05 MED ORDER — HYDROCODONE-ACETAMINOPHEN 5-325 MG PO TABS
1.0000 | ORAL_TABLET | Freq: Two times a day (BID) | ORAL | Status: DC
  Administered 2011-07-06 – 2011-07-13 (×15): 1 via ORAL
  Filled 2011-07-05 (×14): qty 1

## 2011-07-05 MED ORDER — METHOCARBAMOL 500 MG PO TABS: 500.0000 mg | ORAL_TABLET | Freq: Four times a day (QID) | ORAL | Status: DC | PRN

## 2011-07-05 MED ORDER — HYDROCODONE-ACETAMINOPHEN 5-325 MG PO TABS
1.0000 | ORAL_TABLET | ORAL | Status: DC | PRN
  Administered 2011-07-11: 1 via ORAL
  Filled 2011-07-05: qty 1

## 2011-07-05 MED ORDER — PANTOPRAZOLE SODIUM 40 MG PO TBEC
40.0000 mg | DELAYED_RELEASE_TABLET | Freq: Every day | ORAL | Status: DC
  Administered 2011-07-06 – 2011-07-13 (×8): 40 mg via ORAL
  Filled 2011-07-05 (×9): qty 1

## 2011-07-05 MED ORDER — WARFARIN SODIUM 2 MG PO TABS
2.0000 mg | ORAL_TABLET | Freq: Once | ORAL | Status: DC
  Filled 2011-07-05: qty 1

## 2011-07-05 MED ORDER — DIPHENHYDRAMINE HCL 12.5 MG/5ML PO ELIX
12.5000 mg | ORAL_SOLUTION | Freq: Four times a day (QID) | ORAL | Status: DC | PRN
  Filled 2011-07-05: qty 10

## 2011-07-05 MED ORDER — POLYETHYLENE GLYCOL 3350 17 G PO PACK
17.0000 g | PACK | Freq: Every day | ORAL | Status: DC
  Administered 2011-07-06 – 2011-07-12 (×7): 17 g via ORAL
  Filled 2011-07-05 (×9): qty 1

## 2011-07-05 MED ORDER — PROMETHAZINE HCL 25 MG/ML IJ SOLN: 12.5000 mg | Freq: Four times a day (QID) | INTRAMUSCULAR | Status: DC | PRN

## 2011-07-05 MED ORDER — PROMETHAZINE HCL 12.5 MG RE SUPP: 12.5000 mg | Freq: Four times a day (QID) | RECTAL | Status: DC | PRN

## 2011-07-05 MED ORDER — ACETAMINOPHEN 325 MG PO TABS
325.0000 mg | ORAL_TABLET | ORAL | Status: DC | PRN
Start: 2011-07-05 — End: 2011-07-13

## 2011-07-05 MED ORDER — CITALOPRAM HYDROBROMIDE 10 MG PO TABS
10.0000 mg | ORAL_TABLET | Freq: Every day | ORAL | Status: DC
  Administered 2011-07-06 – 2011-07-13 (×8): 10 mg via ORAL
  Filled 2011-07-05 (×10): qty 1

## 2011-07-05 MED ORDER — LEVOTHYROXINE SODIUM 112 MCG PO TABS
112.0000 ug | ORAL_TABLET | Freq: Every day | ORAL | Status: DC
  Administered 2011-07-06 – 2011-07-13 (×8): 112 ug via ORAL
  Filled 2011-07-05 (×10): qty 1

## 2011-07-05 MED ORDER — TRAZODONE HCL 50 MG PO TABS
25.0000 mg | ORAL_TABLET | Freq: Every evening | ORAL | Status: DC | PRN
Start: 2011-07-05 — End: 2011-07-13

## 2011-07-05 MED ORDER — WARFARIN SODIUM 2 MG PO TABS
2.0000 mg | ORAL_TABLET | Freq: Once | ORAL | Status: AC
  Administered 2011-07-05: 2 mg via ORAL
  Filled 2011-07-05: qty 1

## 2011-07-05 MED ORDER — POLYETHYLENE GLYCOL 3350 17 G PO PACK: 17.0000 g | PACK | Freq: Every day | ORAL | Status: DC | PRN

## 2011-07-05 MED ORDER — FOLIC ACID 1 MG PO TABS
1.0000 mg | ORAL_TABLET | Freq: Every day | ORAL | Status: DC
  Administered 2011-07-06 – 2011-07-13 (×8): 1 mg via ORAL
  Filled 2011-07-05 (×9): qty 1

## 2011-07-05 MED ORDER — ALLOPURINOL 100 MG PO TABS
100.0000 mg | ORAL_TABLET | Freq: Every day | ORAL | Status: DC
  Administered 2011-07-06 – 2011-07-13 (×8): 100 mg via ORAL
  Filled 2011-07-05 (×9): qty 1

## 2011-07-05 MED ORDER — FERROUS GLUCONATE 324 (38 FE) MG PO TABS
324.0000 mg | ORAL_TABLET | Freq: Two times a day (BID) | ORAL | Status: DC
  Administered 2011-07-05 – 2011-07-13 (×16): 324 mg via ORAL
  Filled 2011-07-05 (×18): qty 1

## 2011-07-05 NOTE — Progress Notes (Signed)
Subjective: Still having hallucinations at night, per daughter-in-law better mentation today.  Daughter-in-law also reported that patient has had intermittent episodes of hallucinations prior to admission at home.  Patient feeling better today than yesterday  Objective: Vital signs in last 24 hours: Filed Vitals:   07/05/11 0400 07/05/11 0445 07/05/11 0800 07/05/11 1200  BP:  138/67    Pulse:  65    Temp:  97.5 F (36.4 C)    TempSrc:  Oral    Resp: 20 20 20 20   Height:      Weight:      SpO2: 98% 99% 96% 96%   Weight change:   Intake/Output Summary (Last 24 hours) at 07/05/11 1325 Last data filed at 07/05/11 1125  Gross per 24 hour  Intake    920 ml  Output   1300 ml  Net   -380 ml   Physical Exam: General: Awake, Oriented, No acute distress. HEENT: EOMI. Neck: Supple CV: S1 and S2 Lungs: Clear to ascultation bilaterally Abdomen: Soft, Nontender, Nondistended, +bowel sounds. Ext: Good pulses. Trace edema.  Lab Results:  [facility] 07/05/11 0435  NA 140  K 4.2  CL 109  CO2 27  GLUCOSE 101*  BUN 9  CREATININE 0.69  CALCIUM 8.2*  MG --  PHOS --   No results found for this basename: AST:2,ALT:2,ALKPHOS:2,BILITOT:2,PROT:2,ALBUMIN:2 in the last 72 hours No results found for this basename: LIPASE:2,AMYLASE:2 in the last 72 hours  Basename 07/05/11 0435 07/04/11 0455  WBC 6.3 7.8  NEUTROABS -- --  HGB 8.0* 9.1*  HCT 24.0* 26.8*  MCV 85.4 84.8  PLT 122* 122*   No results found for this basename: CKTOTAL:3,CKMB:3,CKMBINDEX:3,TROPONINI:3 in the last 72 hours No components found with this basename: POCBNP:3 No results found for this basename: DDIMER:2 in the last 72 hours No results found for this basename: HGBA1C:2 in the last 72 hours No results found for this basename: CHOL:2,HDL:2,LDLCALC:2,TRIG:2,CHOLHDL:2,LDLDIRECT:2 in the last 72 hours No results found for this basename: TSH,T4TOTAL,FREET3,T3FREE,THYROIDAB in the last 72 hours No results found for this  basename: VITAMINB12:2,FOLATE:2,FERRITIN:2,TIBC:2,IRON:2,RETICCTPCT:2 in the last 72 hours  Micro Results: Recent Results (from the past 240 hour(s))  URINE CULTURE     Status: Normal   Collection Time   07/01/11 10:06 PM      Component Value Range Status Comment   Specimen Description URINE, CATHETERIZED   Final    Special Requests NONE   Final    Setup Time [id number]   Final    Colony Count NO GROWTH   Final    Culture NO GROWTH   Final    Report Status 07/03/2011 FINAL   Final   SURGICAL PCR SCREEN     Status: Abnormal   Collection Time   07/02/11  8:33 AM      Component Value Range Status Comment   MRSA, PCR NEGATIVE  NEGATIVE  Final    Staphylococcus aureus SAMPLE NOT SCREENED FOR [facility] (*) NEGATIVE  Final     Studies/Results: No results found.  Medications: I have reviewed the patient's current medications. Scheduled Meds:   . allopurinol  100 mg Oral Daily  . ALPRAZolam  0.25 mg Oral QHS  . Boost Plus  237 mL Oral BID BM  . citalopram  10 mg Oral Daily  . diltiazem  180 mg Oral Daily  . enoxaparin  40 mg Subcutaneous Q24H  . folic acid  1 mg Oral Daily  . levothyroxine  112 mcg Oral QAC breakfast  . pantoprazole  40 mg Oral Daily  . warfarin  2 mg Oral ONCE-1800   Continuous Infusions:   . DISCONTD: sodium chloride 75 mL/hr at 07/04/11 2137  . DISCONTD: dextrose 5 % and 0.45 % NaCl with KCl 20 mEq/L 100 mL/hr (07/04/11 1803)  . DISCONTD: lactated ringers     PRN Meds:.acetaminophen, acetaminophen, alum & mag hydroxide-simeth, HYDROcodone-acetaminophen, metoCLOPramide (REGLAN) injection, metoCLOPramide, morphine, ondansetron (ZOFRAN) IV, ondansetron, oxyCODONE, zolpidem  Assessment/Plan: Left hip hemiarthroplasty and Left femoral neck fracture on (07/02/2011) - Ortho input seen and appreciated. - DC to CIR today.   Hallucinations at night: Patient is sundowning, and has had symptoms prior to admission.   SSS (sick sinus syndrome) Stable, HR has been  within normal limits.   GOUT (07/06/2010) Stable, pt on allopurinol   HYPOTHYROIDISM (07/06/2010) Stable, will continue home dose of synthroid.   HYPERTENSION (07/06/2010) BP stable.  Anemia: Chronic with some acute blood loss post surgery. Currently stable.  Disposition - Discharge the patient to inpatient rehab.    LOS: 4 days  Maryelizabeth Eberle A, MD 07/05/2011, 1:25 PM

## 2011-07-05 NOTE — Plan of Care (Signed)
Overall Plan of Care [facility]) Patient Details Name: [name] MRN: [id number] DOB: 12-12-1916  Diagnosis:  Left femoral neck fracture status post bipolar hip arthroplasty  Primary Diagnosis:    Fracture, femur Co-morbidities: Anemia, gout, history of sundowning, hypertension  Functional Problem List  Patient demonstrates impairments in the following areas: Balance, Bladder, Bowel, Endurance, Medication Management, Pain, Safety and Skin Integrity  Basic ADL's: grooming, bathing, dressing and toileting Advanced ADL's: simple meal preparation  Transfers:  bed mobility, bed to chair, toilet, tub/shower, car and furniture Locomotion:  ambulation, wheelchair mobility and stairs  Additional Impairments:  Leisure Awareness  Anticipated Outcomes Item Anticipated Outcome  Eating/Swallowing  modified independence  Basic self-care  Supervision  Tolieting  Supervision   Bowel/Bladder  Modified independence  Transfers   supervision, minA car  Locomotion  Supervision with AD  Communication    Cognition    Pain  3 or less on scale of 1-10  Safety/Judgment  supervision  Other     Therapy Plan:  1-2x day 60-90 minutes PT       Team Interventions: Item RN PT OT SLP SW TR Other  Self Care/Advanced ADL Retraining   x      Neuromuscular Re-Education  x x      Therapeutic Activities  x x      UE/LE Strength Training/ROM  x x      UE/LE Coordination Activities  x x      Visual/Perceptual Remediation/Compensation         DME/Adaptive Equipment Instruction  x x      Therapeutic Exercise  x x      Balance/Vestibular Training  x x      Patient/Family Education x x x      Cognitive Remediation/Compensation         Functional Mobility Training  x x      Ambulation/Gait Training  x       Museum/gallery curator  x       Wheelchair Propulsion/Positioning  x x      Functional Tourist information centre manager Reintegration  x x      Dysphagia/Aspiration Sales executive         Bladder Management x        Bowel Management x        Disease Management/Prevention x        Pain Management x x x      Medication Management x        Skin Care/Wound Management x x x      Splinting/Orthotics         Discharge Planning  x x  x    Psychosocial Support     x                       Team Discharge Planning: Destination:  Home Projected Follow-up:  PT, Home Health and OT: TBD Projected Equipment Needs:  Tub Bench? BSC?, ot states she already has RW, rollator, w/c Patient/family involved in discharge planning:  Patient able; no family available   MD ELOS: 10-12 day Medical Rehab Prognosis:  Excellent Assessment: Patient has been admitted for inpatient rehabilitation therapies. Patient will be working with physical therapy occupational therapy nursing social work in position focusing on mobility, balance, adherence to hip precautions and safety, self-care and other ADLs, adaptive equipment training, patient and caregiver safety  education. Goals overall are supervision to modified independence. Patient has a history of sundowning leading up to this admission but has stabilized in this regard while on rehabilitation.  She is quite motivated overall and family is very involved.

## 2011-07-05 NOTE — Progress Notes (Signed)
Pt is alert and oriented arrived to 4003 via [facility].  Pt oriented to room,  Rehab unit and rehab specific information.  Call bell in reach, bed alarm on.  Pt denies pain at this time.  Knee immobolizer on left leg Nemiah Commander

## 2011-07-05 NOTE — PMR Pre-admission (Signed)
PMR Admission Coordinator Pre-Admission Assessment  Patient:  [name] is an 77 y.o., female MRN:  [id number] DOB:  11-Apr-1917 Height:  5\' 4"  (162.6 cm) Weight:  64.4 kg (141 lb 15.6 oz)  Insurance Information:  PRIMARY:Medicare Policy#:[id number]      Subscriber:patient CM Name:      Phone#:     Fax#: Pre-Cert#:      Employer:retired Benefits:  Phone #:visionshare     Name: Eff. Date:08/25/83 A &B     Deduct:$1156      Out of Pocket Max:0      Life ION:GEXBM CIR:100%      SNF:LBD 03/23/10; 100 days Outpatient:80%     Co-Pay:20% Home Health:100%      Co-Pay:0 DME:80%     Co-Pay:20% Providers:patient's choice SECONDARY:[employer]      Policy#:[id number]      Subscriber:patient    Current Medical History:   Patient Admitting Diagnosis:  L-femur fx with L-hip bipolar hemiarthroplasty History of Present Illness: no changes since consult Patients Past Medical History:   Past Medical History  Diagnosis Date  . Complete heart block     s/p PPM by Dr Amil Amen 2004, recent Gen Change  07/13/10  . Small bowel obstruction   . Depression   . Hypertension   . Hypokalemia   . Recurrent UTI   . Gout   . GERD (gastroesophageal reflux disease)   . DVT (deep venous thrombosis)    Family Medical History:  family history is not on file. Patients Current Diet: General  Prior Rehab/Hospitalizations: [facility] approx 1 year ago  Medications   Current Medications: Current facility-administered medications:acetaminophen (TYLENOL) suppository 650 mg, 650 mg, Rectal, Q6H PRN, Alessandra Bevels. [place], [place];  acetaminophen (TYLENOL) tablet 650 mg, 650 mg, Oral, Q6H PRN, Alessandra Bevels. [place], [place], 650 mg at 07/03/11 1019;  allopurinol (ZYLOPRIM) tablet 100 mg, 100 mg, Oral, Daily, Debby Crosley, MD, 100 mg at 07/05/11 0912 ALPRAZolam (XANAX) tablet 0.25 mg, 0.25 mg, Oral, QHS, Debby Crosley, MD, 0.25 mg at 07/04/11 2137;  alum & mag hydroxide-simeth (MAALOX/MYLANTA) 200-200-20 MG/5ML suspension 30 mL, 30 mL,  Oral, Q6H PRN, Gery Pray, MD;  Boost Plus (BOOST Plus) liquid 237 mL, 237 mL, Oral, BID BM, Marshall Cork, RD, 237 mL at 07/05/11 0913;  citalopram (CELEXA) tablet 10 mg, 10 mg, Oral, Daily, Debby Crosley, MD, 10 mg at 07/05/11 0913 diltiazem (DILACOR XR) 24 hr capsule 180 mg, 180 mg, Oral, Daily, Debby Crosley, MD, 180 mg at 07/05/11 0912;  enoxaparin (LOVENOX) injection 40 mg, 40 mg, Subcutaneous, Q24H, Debby Crosley, MD, 40 mg at 07/05/11 0913;  folic acid (FOLVITE) tablet 1 mg, 1 mg, Oral, Daily, Debby Crosley, MD, 1 mg at 07/05/11 0912 HYDROcodone-acetaminophen (NORCO) 5-325 MG per tablet 1-2 tablet, 1-2 tablet, Oral, Q4H PRN, Alessandra Bevels. [place], [place], 1 tablet at 07/05/11 0450;  levothyroxine (SYNTHROID, LEVOTHROID) tablet 112 mcg, 112 mcg, Oral, QAC breakfast, Debby Crosley, MD, 112 mcg at 07/05/11 0912;  metoCLOPramide (REGLAN) injection 5-10 mg, 5-10 mg, Intravenous, Q8H PRN, Alessandra Bevels. [place], [place] metoCLOPramide (REGLAN) tablet 5-10 mg, 5-10 mg, Oral, Q8H PRN, Alessandra Bevels. [place], [place];  morphine 2 MG/ML injection 0.5 mg, 0.5 mg, Intravenous, Q2H PRN, Alessandra Bevels. [place], [place], 0.5 mg at 07/04/11 0942;  ondansetron (ZOFRAN) injection 4 mg, 4 mg, Intravenous, Q6H PRN, Alessandra Bevels. [place], [place];  ondansetron [facility]) tablet 4 mg, 4 mg, Oral, Q6H PRN, Alessandra Bevels. [place], [place] oxyCODONE (Oxy IR/ROXICODONE) immediate release tablet 5 mg, 5 mg, Oral, Q4H PRN, Debby  Crosley, MD, 5 mg at 07/02/11 2028;  pantoprazole (PROTONIX) EC tablet 40 mg, 40 mg, Oral, Daily, Debby Crosley, MD, 40 mg at 07/05/11 0454;  warfarin (COUMADIN) tablet 2 mg, 2 mg, Oral, ONCE-1800, Annia Belt, PHARMD;  zolpidem (AMBIEN) tablet 5 mg, 5 mg, Oral, QHS PRN, Gery Pray, MD DISCONTD: 0.9 %  sodium chloride infusion, , Intravenous, Continuous, Orlando Vega, Last Rate: 75 mL/hr at 07/04/11 2137;  DISCONTD: dextrose 5 % and 0.45 % NaCl with KCl 20 mEq/L infusion, , Intravenous, Continuous, Alessandra Bevels. [place], [place],  Last Rate: 100 mL/hr at 07/04/11 1803, 100 mL/hr at 07/04/11 1803;  DISCONTD: lactated ringers infusion, , Intravenous, Continuous, Delphia Grates;  DISCONTD: metoprolol (LOPRESSOR) 1 MG/ML injection, , , ,   Precautions/Special Needs:  Precautions/Special Needs: Hip/Back Hip/Back Precautions: posterior hip precautions Conditions/Impairments that will impact rehabilitation: Balance;Hearing (slightly HOH) Balance Conditions/Impairments:  (patient generally requiring walker for balance PTA)  Additional Precautions/Restrictions: Precautions Precautions: Posterior Hip;Fall Required Braces or Orthoses: Yes Knee Immobilizer: On except when in CPM (KI in bed unless CPM) Restrictions Weight Bearing Restrictions: Yes LLE Weight Bearing: Weight bearing as tolerated Other Position/Activity Restrictions: WBAT per orders  Therapy Assessments Physical Therapy: Precautions Precautions: Posterior Hip;Fall Required Braces or Orthoses: Yes Knee Immobilizer: On except when in CPM (KI in bed unless CPM) Home Living Lives With:  (has paid CG 10 hrs/day and family nearby) Receives Help From: Family;Personal care attendant;Other (Comment);Home health (paid CG) Type of Home: House Home Layout: One level Home Access: Ramped entrance Bathroom Shower/Tub: Engineer, manufacturing systems: Standard Bathroom Accessibility: Yes How Accessible: Accessible via walker Home Adaptive Equipment: Bedside commode/3-in-1;Hospital bed;Hand-held shower hose Additional Comments: Sister and son live next door.  CNA came in room toward end of session (wearing CAM boot). Prior Function Level of Independence: Requires assistive device for independence;Needs assistance with ADLs;Needs assistance with homemaking Bath: Minimal Toileting: Other (comment) (modified independent with toileting) Dressing: Other (comment) (modified independent with AE) Meal Prep: Other (comment) (per grandson, has assist) Light Housekeeping:  Other (comment) (has assist) Driving: No Vocation: Retired Comments: Pt reports she uses RW for mobility.    Occupational Therapy: Precautions Precautions: Posterior Hip;Fall Required Braces or Orthoses: Yes Knee Immobilizer: On except when in CPM (KI in bed unless CPM) Home Living Lives With:  (has paid CG 10 hrs/day and family nearby) Receives Help From: Family;Personal care attendant;Other (Comment);Home health (paid CG) Type of Home: House Home Layout: One level Home Access: Ramped entrance Bathroom Shower/Tub: Engineer, manufacturing systems: Standard Bathroom Accessibility: Yes How Accessible: Accessible via walker Home Adaptive Equipment: Bedside commode/3-in-1;Hospital bed;Hand-held shower hose Additional Comments: Sister and son live next door.  CNA came in room toward end of session (wearing CAM boot). Prior Function Level of Independence: Requires assistive device for independence;Needs assistance with ADLs;Needs assistance with homemaking Bath: Minimal Toileting: Other (comment) (modified independent with toileting) Dressing: Other (comment) (modified independent with AE) Meal Prep: Other (comment) (per grandson, has assist) Light Housekeeping: Other (comment) (has assist) Driving: No Vocation: Retired Comments: Pt reports she uses RW for mobility.   Restrictions Weight Bearing Restrictions: Yes LLE Weight Bearing: Weight bearing as tolerated Other Position/Activity Restrictions: WBAT per orders ADL Eating/Feeding: Simulated;Independent Where Assessed - Eating/Feeding: Chair Grooming: Performed;Denture care;Minimal assistance Grooming Details (indicate cue type and reason): pt with trembly with both hands. Needs assist to stabilize dentures to rinse and apply adhesive Where Assessed - Grooming: Sitting, chair Upper Body Bathing: Simulated;Chest;Right arm;Left arm;Abdomen;Set up;Supervision/safety Where Assessed - Upper Body Bathing: Supported;Sitting,  chair Lower Body Bathing: Maximal assistance Lower Body Bathing Details (indicate cue type and reason): excluding periareas in a seated position without adaptive equipment Where Assessed - Lower Body Bathing: Sitting, chair Upper Body Dressing: Simulated;Minimal assistance Where Assessed - Upper Body Dressing: Sitting, chair;Supported Lower Body Dressing: +1 Total assistance Lower Body Dressing Details (indicate cue type and reason): without adaptive equipment. seated only. used reacher to doff right sock with mod assist. Roe Coombs right sock with SA with max assist Where Assessed - Lower Body Dressing: Sitting, chair Toilet Transfer: Not assessed Toilet Transfer Method: Not assessed Toileting - Clothing Manipulation: Not assessed Toileting - Hygiene: Not assessed Tub/Shower Transfer: Not assessed Equipment Used: Reacher;Sock aid ADL Comments: She owns a reacher and shoe horn but has not used a sock aid before until today. Educated grandson on where to obtain sock aid and LHS.  SLP Recommendations: Equipment Recommended: Defer to next venue    Prior Function: Level of Independence: Requires assistive device for independence;Needs assistance with ADLs;Needs assistance with homemaking Bath: Minimal Toileting: Other (comment) (modified independent with toileting) Dressing: Other (comment) (modified independent with AE) Meal Prep: Other (comment) (per grandson, has assist) Light Housekeeping: Other (comment) (has assist) Driving: No Vocation: Retired Comments: Pt reports she uses RW for mobility. ADL Eating/Feeding: Simulated;Independent Where Assessed - Eating/Feeding: Chair Grooming: Performed;Denture care;Minimal assistance Grooming Details (indicate cue type and reason): pt with trembly with both hands. Needs assist to stabilize dentures to rinse and apply adhesive Where Assessed - Grooming: Sitting, chair Upper Body Bathing: Simulated;Chest;Right arm;Left arm;Abdomen;Set  up;Supervision/safety Where Assessed - Upper Body Bathing: Supported;Sitting, chair Lower Body Bathing: Maximal assistance Lower Body Bathing Details (indicate cue type and reason): excluding periareas in a seated position without adaptive equipment Where Assessed - Lower Body Bathing: Sitting, chair Upper Body Dressing: Simulated;Minimal assistance Where Assessed - Upper Body Dressing: Sitting, chair;Supported Lower Body Dressing: +1 Total assistance Lower Body Dressing Details (indicate cue type and reason): without adaptive equipment. seated only. used reacher to doff right sock with mod assist. Roe Coombs right sock with SA with max assist Where Assessed - Lower Body Dressing: Sitting, chair Toilet Transfer: Not assessed Toilet Transfer Method: Not assessed Toileting - Clothing Manipulation: Not assessed Toileting - Hygiene: Not assessed Tub/Shower Transfer: Not assessed Equipment Used: Reacher;Sock aid ADL Comments: She owns a reacher and shoe horn but has not used a sock aid before until today. Educated grandson on where to obtain sock aid and LHS.  Additional Prior Functional Levels:  Bed Mobility: independent Transfers: mod I Mobility - Walk/Wheelchair: used walker Upper Body Dressing: independent Lower Body Dressing: required minimal assistance Grooming: independent Eating/Drinking: independent Toilet Transfer: mod I Bladder Continence: independent Bowel Management: independent Stair Climbing: ramp at home Communication: WNL Memory: forgetful at times; family reports patient hallucinates from time to time Cooking/Meal Prep: paid cg Housework: paid cg Money Management: family Driving: family/paid cg  Prior Activity Level: Technical brewer (1-2x/wk): limited throughout community  ADLs/Mobility: ADL Eating/Feeding: Simulated;Independent Where Assessed - Eating/Feeding: Chair Grooming: Performed;Denture care;Minimal assistance Grooming Details (indicate cue type and  reason): pt with trembly with both hands. Needs assist to stabilize dentures to rinse and apply adhesive Where Assessed - Grooming: Sitting, chair Upper Body Bathing: Simulated;Chest;Right arm;Left arm;Abdomen;Set up;Supervision/safety Where Assessed - Upper Body Bathing: Supported;Sitting, chair Lower Body Bathing: Maximal assistance Lower Body Bathing Details (indicate cue type and reason): excluding periareas in a seated position without adaptive equipment Where Assessed - Lower Body Bathing: Sitting, chair Upper Body Dressing: Simulated;Minimal assistance Where Assessed -  Upper Body Dressing: Sitting, chair;Supported Lower Body Dressing: +1 Total assistance Lower Body Dressing Details (indicate cue type and reason): without adaptive equipment. seated only. used reacher to doff right sock with mod assist. Roe Coombs right sock with SA with max assist Where Assessed - Lower Body Dressing: Sitting, chair Toilet Transfer: Not assessed Toilet Transfer Method: Not assessed Toileting - Clothing Manipulation: Not assessed Toileting - Hygiene: Not assessed Tub/Shower Transfer: Not assessed Equipment Used: Reacher;Sock aid ADL Comments: She owns a reacher and shoe horn but has not used a sock aid before until today. Educated grandson on where to obtain sock aid and LHS.  Bed Mobility Bed Mobility: Yes Supine to Sit: 3: Mod assist;HOB elevated (Comment degrees) Supine to Sit Details (indicate cue type and reason): assist for trunk and L LE off of bed, verbal cues for technique Sitting - Scoot to Edge of Bed: 2: Max assist Sitting - Scoot to [place] of Bed Details (indicate cue type and reason): assist to move hips to EOB Sit to Supine - Left: 1: +2 Total assist;HOB flat Sit to Supine - Left Details (indicate cue type and reason): verbal cues for technique, assist for trunk and bringing bilateral LEs on to bed, pt=40% Scooting to HOB: 1: +2 Total assist Scooting to [facility] Details (indicate cue type and  reason): pt=20%, pt attempted to assist with UEs and verbal cues to use R LE to assist Transfers Transfers: Yes Sit to Stand: 1: +2 Total assist;With upper extremity assist;From bed Sit to Stand Details (indicate cue type and reason): pt=85%, verbal cues for hand placement, continues to need support to bring body weight over COG to decrease posterior lean Stand to Sit: 1: +2 Total assist;With armrests;To chair/3-in-1 Stand to Sit Details: pt=85%, verbal cues for hand placement and L LE forward, assist to control descent Ambulation/Gait Ambulation/Gait: Yes Ambulation/Gait Assistance: 1: +2 Total assist Ambulation/Gait Assistance Details (indicate cue type and reason): pt=85%, pt able to take 4 steps with verbal cues for sequence, pt did much better with ambulating correctly today (followed precautions).  pt fatigued quickly and requested recliner. Ambulation Distance (Feet): 2 Feet Assistive device: Rolling walker Gait Pattern: Antalgic;Step-to pattern  Home Assistive Devices/Equipment:  Home Assistive Devices/Equipment Home Assistive Devices/Equipment: Hospital bed;Shower/tub chair;Walker (specify type)  Discharge Planning:  Living Arrangements: Alone (family to help along with paid CG) Support Systems: Children;Family members;Other (Comment) (Paid CG- Sandra) Assistance Needed: will need assistance once d/c Do you have any problems obtaining your medications?: No Type of Residence: Private residence Home Care Services: Yes Type of Home Care Services: Homehealth aide Patient expects to be discharged to:: home Expected Discharge Date:  (to be determined)  Current Functional Levels:   see therapy assessments above  Previous Home Environment:  Living Arrangements: Alone (family to help along with paid CG) Support Systems: Children;Family members;Other (Comment) (Paid CG- Sandra) Assistance Needed: will need assistance once d/c Do you have any problems obtaining your medications?:  No Type of Residence: Private residence Home Care Services: Yes Type of Home Care Services: Homehealth aide Patient expects to be discharged to:: home Expected Discharge Date:  (to be determined) Home Environment Number of Levels: 1 Previous Home Environment Number of Steps: ramp Previous Home Environment Is Bedroom on Main Floor?: Yes Previous Home Environment Is Bathroom on Main Floor?: Yes  Discharge Living Setting:  Plans for Discharge Living Setting: Patient's home;Alone (paid cg) Discharge Living Setting Number of Levels: 1 Discharge Living Setting Number of Steps: ramp Discharge Living Setting is Bedroom on  Main Floor?: Yes Discharge Living Setting is Bathroom on Main Floor?: Yes  Social/Family/Support Systems:  Patient Roles: Parent Anticipated Caregiver: Taleya Whitcher (dtr in law) [phone] cell [phone] (Son Joyice FasterOnalee Hua) [place] [phone]) Ability/Limitations of Caregiver: will have assistance Caregiver Availability: 24/7 Discharge Plan Discussed with Primary Caregiver: Yes Is Caregiver In Agreement with Plan?: Yes Does Caregiver/Family have Issues with Lodging/Transportation while Pt is in Rehab?: No  Goals/Additional Needs:  Patient/Family Goal for Rehab: to be at least back to level she was at Cultural Considerations: none Equipment Needs: to be determined Pt/Family Agrees to Admission and willing to participate: Yes Program Orientation Provided & Reviewed with Pt/Caregiver Including Roles  & Responsibilities: Yes  Preadmission Screen Completed By:  Oletta Darter, 07/05/2011 12:59 PM  Patient's condition:  This patient's condition remains as documented in the Consult dated 07/04/11 @1326 , in which the Rehabilitation Physician determined and documented that the patient's condition is appropriate for intensive rehabilitative care in an inpatient rehabilitation facility.  Preadmission Screen Competed NF:AOZHY Thomas,RN, Time/Date,1304 on 07/05/11  Discussed  status with Dr. Riley Kill @1304  07/05/11 and received telephone order approval for admission today.  Admission Coordinator:  Oletta Darter, time [id number] on 07/05/11.

## 2011-07-05 NOTE — H&P (Signed)
Physical Medicine and Rehabilitation Admission H&P  Chief Complaint   Patient presents with   .  Hip Pain due to fall and fracture.   :  HPI: [name] is an 77 y.o. female who fell backwards while walking without her walker and sustained left femur fracture on 12/09. Patient evaluated by Dr. Luiz Blare and underwent left hip bipolar arthroplasty on the same day. Post op WBAT and on coumadin/lovenox bridge for DVT prophylaxis. Patient has had problems with confusion and hallucinations. PT eval done yesterday and patient unable to perform bed mobility due to increased pain with LLE movement but up in a chair today. MD, patient and family requesting CIR (as patient did well during/past her CIR stay last year)  Review of Systems  Constitutional: Negative for fever and malaise/fatigue.  HENT: Positive for hearing loss.   Eyes: Negative for blurred vision and double vision.  Respiratory: Negative for cough and shortness of breath.   Cardiovascular: Negative for chest pain and palpitations.  Gastrointestinal: Positive for constipation. Negative for abdominal pain and diarrhea.  Genitourinary: Positive for frequency (Nocturis 3+ times).  Musculoskeletal:       Left hip pain with activity.  Psychiatric/Behavioral: Positive for hallucinations (Better but still occ during the day. " Last night was the worst").   Past Medical History   Diagnosis  Date   .  Complete heart block      s/p PPM by Dr Amil Amen 2004, recent Gen Change 07/13/10   .  Small bowel obstruction    .  Depression    .  Hypertension    .  Hypokalemia    .  Recurrent UTI    .  Gout    .  GERD (gastroesophageal reflux disease)     Duodenal ulcer     Cervical myelopathy with SCI 08/11     Chronic gait disorder.     Anemia    .  DVT (deep venous thrombosis)     Past Surgical History   Procedure  Date   .  Insert / replace / remove pacemaker      initial pacemaker by Dr Amil Amen 2004, replaced by [facility] for ERI 07/13/10   .  Hip  arthroplasty  07/02/2011     Procedure: ARTHROPLASTY BIPOLAR HIP; Surgeon: Harvie Junior; Location: [facility] ORS; Service: Orthopedics; Laterality: Left;    Family history: noncontributory due to advanced age.   Social History:Lives alone and independent PTA. She reports quit smoking about 30 years ago. She does not have any smokeless tobacco history on file. She reports that she does not drink alcohol or use illicit drugs. Multiple family members live around her and can provide 24 hours assist past discharge. Active and does a home exercise program daily.   Allergies   Allergen  Reactions   .  Aspirin    .  Penicillins  Rash    Prior to Admission medications   Medication  Sig  Start Date  End Date  Taking?  Authorizing Provider   acetaminophen (TYLENOL) 325 MG tablet  Take 650 mg by mouth every 4 (four) hours as needed. For pain.    Yes  Historical Provider, MD   allopurinol (ZYLOPRIM) 100 MG tablet  Take 100 mg by mouth daily.    Yes  Historical Provider, MD   ALPRAZolam Prudy Feeler) 0.5 MG tablet  Take 0.25 mg by mouth at bedtime.    Yes  Historical Provider, MD   citalopram (CELEXA) 10 MG tablet  Take  10 mg by mouth daily.    Yes  Historical Provider, MD   diltiazem (DILACOR XR) 180 MG 24 hr capsule  Take 180 mg by mouth daily.    Yes  Historical Provider, MD   ENSURE (ENSURE)  Take 237 mLs by mouth 2 (two) times daily.    Yes  Historical Provider, MD   folic acid (FOLVITE) 1 MG tablet  Take 1 mg by mouth daily.    Yes  Historical Provider, MD   levothyroxine (SYNTHROID) 112 MCG tablet  Take 1 tablet (112 mcg total) by mouth daily.  08/29/10  08/29/11  Yes  Elyn Aquas., MD   Oxybutynin Chloride (GELNIQUE TD)  Place 1 packet onto the skin daily.    Yes  Historical Provider, MD   pantoprazole (PROTONIX) 40 MG tablet  Take 40 mg by mouth daily.    Yes  Historical Provider, MD   POLYSACCHARIDE IRON (IRON POLYSACCHARIDES) 150 MG CAPS  Take 150 mg by mouth 2 (two) times daily.    Yes  Historical  Provider, MD   Scheduled Medications:   .  allopurinol  100 mg  Oral  Daily   .  ALPRAZolam  0.25 mg  Oral  QHS   .  Boost Plus  237 mL  Oral  BID BM   .  citalopram  10 mg  Oral  Daily   .  diltiazem  180 mg  Oral  Daily   .  enoxaparin  40 mg  Subcutaneous  Q24H   .  folic acid  1 mg  Oral  Daily   .  levothyroxine  112 mcg  Oral  QAC breakfast   .  pantoprazole  40 mg  Oral  Daily    PRN MED's:  acetaminophen, acetaminophen, alum & mag hydroxide-simeth, HYDROcodone-acetaminophen, metoCLOPramide (REGLAN) injection, metoCLOPramide, morphine, ondansetron (ZOFRAN) IV, ondansetron, oxyCODONE, zolpidem  Home:  Home Living  Lives With: Alone  Receives Help From: Personal care attendant (CNA during the day) for 8 hours a day.  Type of Home: House  Bathroom Shower/Tub: Medical sales representative: Standard  Home Adaptive Equipment: Shower chair with back;Walker - rolling  Additional Comments: Sister and son live next door. (CNA came in room toward end of session (wearing CAM boot).) Family has been providing increased Assistance.  Functional History:  Prior Function  Level of Independence: Requires assistive device for independence;Needs assistance with ADLs;Needs assistance with homemaking  Bath: Minimal  Toileting: Other (comment) (modified independent with toileting)  Dressing: Other (comment) (modified independent with AE)  Meal Prep: Other (comment) (per grandson, has assist)  Light Housekeeping: Other (comment) (has assist)  Comments: Pt reports she uses RW for mobility.  Functional Status:  Mobility:  Bed Mobility  Bed Mobility: Yes  Supine to Sit: 1: +2 Total assist;HOB elevated (Comment degrees)  Supine to Sit Details (indicate cue type and reason): pt=40%, assist required for trunk and L LE  Sitting - Scoot to Edge of Bed: 2: Max assist  Sitting - Scoot to [place] of Bed Details (indicate cue type and reason): assist for L hip forward  Sit to Supine - Left: 1: +2 Total  assist;HOB flat  Sit to Supine - Left Details (indicate cue type and reason): verbal cues for technique, assist for trunk and bringing bilateral LEs on to bed, pt=40%  Scooting to HOB: 1: +2 Total assist  Scooting to [facility] Details (indicate cue type and reason): pt=20%, pt attempted to assist with UEs and verbal  cues to use R LE to assist  Transfers  Transfers: Yes  Sit to Stand: 1: +2 Total assist;With armrests;From chair/3-in-1  Sit to Stand Details (indicate cue type and reason): pt=60%, verbal cues for hand placement, assist for weakness and shifting weight forward over feet (posterior lean present)  Stand to Sit: 1: +2 Total assist;With upper extremity assist;To bed  Stand to Sit Details: pt=50%, assist to control descent, verbal and manual cue for L LE forward  Ambulation/Gait  Ambulation/Gait: Yes  Ambulation/Gait Assistance: 1: +2 Total assist  Ambulation/Gait Assistance Details (indicate cue type and reason): pt=40%, pt able take 3-4 steps with constant step by step verbal cues very slowly then started allowing L LE to IR and adduct and not able to correct with verbal or tactile cues, so therapist assisted back to neutral. pt then pivoted feet together to allow recliner to be brought behind pt.  Ambulation Distance (Feet): 2 Feet  Assistive device: Rolling walker  Gait Pattern: Antalgic;Step-to pattern   ADL:  ADL  Eating/Feeding: Simulated;Independent  Where Assessed - Eating/Feeding: Chair  Grooming: Performed;Denture care;Minimal assistance  Grooming Details (indicate cue type and reason): pt with trembly with both hands. Needs assist to stabilize dentures to rinse and apply adhesive  Where Assessed - Grooming: Sitting, chair  Upper Body Bathing: Simulated;Chest;Right arm;Left arm;Abdomen;Set up;Supervision/safety  Where Assessed - Upper Body Bathing: Supported;Sitting, chair  Lower Body Bathing: Maximal assistance  Lower Body Bathing Details (indicate cue type and reason):  excluding periareas in a seated position without adaptive equipment  Where Assessed - Lower Body Bathing: Sitting, chair  Upper Body Dressing: Simulated;Minimal assistance  Where Assessed - Upper Body Dressing: Sitting, chair;Supported  Lower Body Dressing: +1 Total assistance  Lower Body Dressing Details (indicate cue type and reason): without adaptive equipment. seated only. used reacher to doff right sock with mod assist. Roe Coombs right sock with SA with max assist  Where Assessed - Lower Body Dressing: Sitting, chair  Toilet Transfer: Not assessed  Toilet Transfer Method: Not assessed  Toileting - Clothing Manipulation: Not assessed  Toileting - Hygiene: Not assessed  Tub/Shower Transfer: Not assessed  Equipment Used: Reacher;Sock aid  ADL Comments: She owns a reacher and shoe horn but has not used a sock aid before until today. Educated grandson on where to obtain sock aid and LHS.  Cognition:  Cognition  Arousal/Alertness: Awake/alert  Orientation Level: Oriented to person  Cognition  Arousal/Alertness: Awake/alert  Overall Cognitive Status: Impaired  Memory: Decreased recall of precautions  Memory Deficits: Reviewed THPs with patient.  Orientation Level: Oriented to person  Safety/Judgement: Decreased awareness of safety precautions  Decreased Safety/Judgement: Decreased awareness of need for assistance  Safety/Judgement - Other Comments: was trying to lean forward to get out of chair to go down the hall and see "the man that just left."  Cognition - Other Comments: Grandson reports hallucinations and OT notes patient stating that she needs to go find the man that walked down the hall.  Blood pressure 138/67, pulse 65, temperature 97.5 F (36.4 C), temperature source Oral, resp. rate 20, height 5\' 4"  (1.626 m), weight 64.4 kg (141 lb 15.6 oz), SpO2 96.00%.   Physical Exam  Constitutional: She is oriented to person, place, and time. She appears well-developed and well-nourished.    HENT:  Mouth/Throat: Oropharynx is clear and moist.       Wears upper and partial lower dentures.  Eyes: Pupils are equal, round, and reactive to light.  Neck: Normal range of motion.  Cardiovascular: Normal rate and regular rhythm.   Pulmonary/Chest: Effort normal and breath sounds normal. No respiratory distress.  Abdominal: Soft. Bowel sounds are normal. There is no tenderness.  Musculoskeletal:       Moderate edema left hip.  Tends to keep left foot inverted.  KI in place LLE.  Incision without erythema.  Neurological: She is alert and oriented to person, place, and time. She has normal reflexes. A cranial nerve deficit is present.       Memory intact.  Follows commands without difficulty.  A little distractible. Sometimes tangential with thought processing. Hard of hearing.  Skin: Skin is warm and dry.       Left hip incision clean and dry.  Staples intact.  No drainage.    Psychiatric: She has a normal mood and affect. Her behavior is normal. Judgment and thought content normal.   @[email or web address] @  BASIC METABOLIC PANEL Status: Abnormal    Collection Time    07/05/11 4:35 AM   Component  Value  Range  Comment    Sodium  140  135 - 145 (mEq/L)     Potassium  4.2  3.5 - 5.1 (mEq/L)     Chloride  109  96 - 112 (mEq/L)     CO2  27  19 - 32 (mEq/L)     Glucose, Bld  101 (*)  70 - 99 (mg/dL)     BUN  9  6 - 23 (mg/dL)     Creatinine, Ser  8.29  0.50 - 1.10 (mg/dL)     Calcium  8.2 (*)  8.4 - 10.5 (mg/dL)     GFR calc non Af Amer  73 (*)  >90 (mL/min)     GFR calc Af Amer  85 (*)  >90 (mL/min)    No results found.  Post Admission Physician Evaluation:  1. Functional deficits secondary to left femoral neck fracture status post bipolar hip hemiarthroplasty 2. Patient admitted to receive collaborative, interdisciplinary care between the physiatrist, rehab nursing staff, and therapy team. 3. Patient's level of medical complexity and substantial therapy needs in context of that medical  necessity cannot be provided at a lesser intensity of care. 4. Patient has experienced substantial functional loss from his/her baseline.please see above functional assessment of her premorbid and current functional levels. Judging by the patient's diagnosis, physical exam, and functional history, the patient has potential for functional progress which will result in measurable gains while on inpatient rehab. These gains will be of substantial and practical use upon discharge in facilitating mobility and self-care at the household level. 5. Physiatrist will provide 24 hour management of medical needs as well as oversight of the therapy plan/treatment and provide guidance as appropriate regarding the interaction of the two. 6. 24 hour rehab nursing will assist in the management of bladder management, bowel management, safety, skin/wound care, disease management, medication administration, pain management and patient education and help integrate therapy concepts, techniques,education, etc. 7. PT will assess and treat for: Lower extremity strength, hip precautions, adaptive equipment, gait, safety. Goals supervision  8. OT will assess and treat for: Upper extremity strength, and observance of hip precautions, ADLs, functional mobility. Goals supervision to minimal assistance.  9. SLP will assess and treat for: Observe for any speech-language pathology needs for now  10. Case Management and Social Worker will assess and treat for psychological issues and discharge planning. 11. Team conference will be held weekly to assess progress toward goals and to determine barriers to discharge. 12.  Patient will receive at least 3 hours of therapy per day at least 5 days per week. ELOS and Prognosis: 2 weeks +.  Potential is good to excellent  Medical Problem List and Plan:  1. DVT Prophylaxis/Anticoagulation: Continue coumadin.  2. Pain Management: better with prn oxycodone prior to therapies. Family reports no  worsening of cognition post narcotic doses.   3. Mood:Pleasant and appropriated.  Motivated to get better.    4. Fracture, femur (07/02/2011) : Underwent left hip hemi on 12/09 and is WBAT. CIR program as above.   5. SSS: PPM in place. Continue cardizem   6. GOUT: Resume allopurinol to avoid flare up.   7. HYPOTHYROIDISM: Continue supplement.   8. HYPERTENSION: Monitor with bid checks. Continue cardizem 180 mg daily.   9. Sundowning/ Insomnia: Low dose xanax at bedtime effective.  But having hallucinations at night.  Monitor.  May need low dose antipsychotic. Patient is close to me that she was having some hallucinations at home prior to this admission but these are only mild. Will observe for sleep patterns and nighttime symptoms. She is open to trying antipsychotic if needed.  10. Anemia: Resume iron supplement. Today with drop to 8.0. Recheck in am if drops further may need transfusion, as with advanced age and history of arrythymia    07/05/2011, 11:47 AM

## 2011-07-05 NOTE — Progress Notes (Signed)
ANTICOAGULATION CONSULT NOTE - Follow Up Consult  Pharmacy Consult for Warfarin Indication: VTE Prophy s/p hip Fx repair  Allergies  Allergen Reactions  . Aspirin   . Penicillins Rash    Patient Measurements: Height: 5\' 4"  (162.6 cm) Weight: 141 lb 15.6 oz (64.4 kg) IBW/kg (Calculated) : 54.7   Vital Signs: Temp: 97.5 F (36.4 C) (12/12 0445) Temp src: Oral (12/12 0445) BP: 138/67 mmHg (12/12 0445) Pulse Rate: 65  (12/12 0445)  Labs:  Basename 07/05/11 0435 07/04/11 0455 07/03/11 0455  HGB 8.0* 9.1* --  HCT 24.0* 26.8* 28.2*  PLT 122* 122* 129*  APTT -- -- --  LABPROT 26.0* 24.5* 17.0*  INR 2.34* 2.16* 1.36  HEPARINUNFRC -- -- --  CREATININE 0.69 -- --  CKTOTAL -- -- --  CKMB -- -- --  TROPONINI -- -- --   Estimated Creatinine Clearance: 38.7 ml/min (by C-G formula based on Cr of 0.69).   Medications:  Scheduled:     . allopurinol  100 mg Oral Daily  . ALPRAZolam  0.25 mg Oral QHS  . Boost Plus  237 mL Oral BID BM  . citalopram  10 mg Oral Daily  . diltiazem  180 mg Oral Daily  . enoxaparin  40 mg Subcutaneous Q24H  . folic acid  1 mg Oral Daily  . levothyroxine  112 mcg Oral QAC breakfast  . pantoprazole  40 mg Oral Daily   Infusions:     . DISCONTD: sodium chloride 75 mL/hr at 07/04/11 2137  . DISCONTD: dextrose 5 % and 0.45 % NaCl with KCl 20 mEq/L 100 mL/hr (07/04/11 1803)  . DISCONTD: lactated ringers     PRN: acetaminophen, acetaminophen, alum & mag hydroxide-simeth, HYDROcodone-acetaminophen, metoCLOPramide (REGLAN) injection, metoCLOPramide, morphine, ondansetron (ZOFRAN) IV, ondansetron, oxyCODONE, zolpidem  Assessment: 77 yo F s/p hip repair on 12/9. On warfarin and Lovenox 40mg  SQ q24h. Warfarin dose held last night to slow INR trend. INR remains in goal range 2-3 today. No bleeding events reported in chart notes. Will resume warfarin dosing tonight using smaller doses.  Has has warfarin 5mg  x1 and 4mg x1 so far.  Goal of Therapy:  INR  2-3   Plan:  1)  Warfarin 2mg  PO x1 at 18:00. 2)  F/U daily INR trend 3)  Suggest d/c'ing Lovenox (MD must write order)  Annia Belt 07/05/2011,11:49 AM

## 2011-07-05 NOTE — Discharge Summary (Signed)
Discharge Summary  [name] MR#: [id number]  DOB:1917-01-05  Date of Admission: 07/01/2011 Date of Discharge: 07/05/2011  Patient's PCP: [name], MD  Attending Physician:Ifeanyi Mickelson A  Consults: Dr. Luiz Blare - Orthopedics.  Discharge Diagnoses: Principal Problem:  *Fracture, femur Active Problems:  SSS (sick sinus syndrome)  GOUT  HYPOTHYROIDISM  HYPERTENSION  Acute delirium   Brief Admitting History and Physical 77-year-old Caucasian female who presented on 07/02/2011 after a fall and with left femur fracture.  Discharge Medications Current Discharge Medication List    CONTINUE these medications which have NOT CHANGED   Details  acetaminophen (TYLENOL) 325 MG tablet Take 650 mg by mouth every 4 (four) hours as needed. For pain.     allopurinol (ZYLOPRIM) 100 MG tablet Take 100 mg by mouth daily.      ALPRAZolam (XANAX) 0.5 MG tablet Take 0.25 mg by mouth at bedtime.      citalopram (CELEXA) 10 MG tablet Take 10 mg by mouth daily.      diltiazem (DILACOR XR) 180 MG 24 hr capsule Take 180 mg by mouth daily.      ENSURE (ENSURE) Take 237 mLs by mouth 2 (two) times daily.      folic acid (FOLVITE) 1 MG tablet Take 1 mg by mouth daily.      levothyroxine (SYNTHROID) 112 MCG tablet Take 1 tablet (112 mcg total) by mouth daily. Qty: 30 tablet, Refills: 11    Oxybutynin Chloride (GELNIQUE TD) Place 1 packet onto the skin daily.     pantoprazole (PROTONIX) 40 MG tablet Take 40 mg by mouth daily.      POLYSACCHARIDE IRON (IRON POLYSACCHARIDES) 150 MG CAPS Take 150 mg by mouth 2 (two) times daily.         Hospital Course: Left hip hemiarthroplasty and Left femoral neck fracture on (07/02/2011) - Ortho service, Dr. Luiz Blare performed left hip hemiarthroplasty on 07/02/2011.  Physical therapy evaluated the patient after surgery and recommended rehabilitation. Patient was started on Coumadin for anticoagulation.  Will defer to orthopedic service for  duration of anticoagulation at discharge.  Patient to be discharged to CIR today.  Hallucinations at night - Suspect the patient is likely sundowning.  I had an extensive discussion with the patient's daughter-in-law who reported that the patient has been having episodes of hallucinations with decreased memory about 3 or 4 months prior to admission.  Patient's hallucinations have been improving and per daughter-in-law at the time of discharge her mentation is back to baseline.  Uncertain if the patient has dementia as daughter-in-law reports the patient has had lapses in memory over the last few months.  Infectious workup with urinalysis was negative and chest x-ray on 07/01/2011 was also negative.  Instructed the daughter-in-law to followup with the patient's primary care physician for further care and management after discharge.  SSS (sick sinus syndrome) Stable, HR has been within normal limits.   GOUT (07/06/2010) Stable, pt on allopurinol   HYPOTHYROIDISM (07/06/2010) Stable, will continue home dose of synthroid during the course of hospital stay.   HYPERTENSION (07/06/2010) BP stable continue the patient on home medications.  Anemia: Chronic with some acute blood loss post surgery. Currently stable.   Day of Discharge BP 138/67  Pulse 65  Temp(Src) 97.5 F (36.4 C) (Oral)  Resp 20  Ht 5\' 4"  (1.626 m)  Wt 64.4 kg (141 lb 15.6 oz)  BMI 24.37 kg/m2  SpO2 96%  Results for orders placed during the hospital encounter of 07/01/11 (from the past 48  hour(s))  PROTIME-INR     Status: Abnormal   Collection Time   07/04/11  4:55 AM      Component Value Range Comment   Prothrombin Time 24.5 (*) 11.6 - 15.2 (seconds)    INR 2.16 (*) 0.00 - 1.49    CBC     Status: Abnormal   Collection Time   07/04/11  4:55 AM      Component Value Range Comment   WBC 7.8  4.0 - 10.5 (K/uL)    RBC 3.16 (*) 3.87 - 5.11 (MIL/uL)    Hemoglobin 9.1 (*) 12.0 - 15.0 (g/dL)    HCT 40.9 (*) 81.1 - 46.0 (%)     MCV 84.8  78.0 - 100.0 (fL)    MCH 28.8  26.0 - 34.0 (pg)    MCHC 34.0  30.0 - 36.0 (g/dL)    RDW 91.4  78.2 - 95.6 (%)    Platelets 122 (*) 150 - 400 (K/uL)   PROTIME-INR     Status: Abnormal   Collection Time   07/05/11  4:35 AM      Component Value Range Comment   Prothrombin Time 26.0 (*) 11.6 - 15.2 (seconds)    INR 2.34 (*) 0.00 - 1.49    CBC     Status: Abnormal   Collection Time   07/05/11  4:35 AM      Component Value Range Comment   WBC 6.3  4.0 - 10.5 (K/uL)    RBC 2.81 (*) 3.87 - 5.11 (MIL/uL)    Hemoglobin 8.0 (*) 12.0 - 15.0 (g/dL)    HCT 21.3 (*) 08.6 - 46.0 (%)    MCV 85.4  78.0 - 100.0 (fL)    MCH 28.5  26.0 - 34.0 (pg)    MCHC 33.3  30.0 - 36.0 (g/dL)    RDW 57.8  46.9 - 62.9 (%)    Platelets 122 (*) 150 - 400 (K/uL)   BASIC METABOLIC PANEL     Status: Abnormal   Collection Time   07/05/11  4:35 AM      Component Value Range Comment   Sodium 140  135 - 145 (mEq/L)    Potassium 4.2  3.5 - 5.1 (mEq/L)    Chloride 109  96 - 112 (mEq/L)    CO2 27  19 - 32 (mEq/L)    Glucose, Bld 101 (*) 70 - 99 (mg/dL)    BUN 9  6 - 23 (mg/dL)    Creatinine, Ser 5.28  0.50 - 1.10 (mg/dL)    Calcium 8.2 (*) 8.4 - 10.5 (mg/dL)    GFR calc non Af Amer 73 (*) >90 (mL/min)    GFR calc Af Amer 85 (*) >90 (mL/min)     Dg Chest 1 View  07/01/2011  *RADIOLOGY REPORT*  Clinical Data: The patient fell today.  Left hip fracture.  CHEST - 1 VIEW  Comparison: 07/12/2010  Findings: Stable appearance of cardiac pacemaker since previous study.  Mild cardiac enlargement with normal pulmonary vascularity. Slight fibrosis in the lung bases.  No focal airspace consolidation in the lungs.  No blunting of costophrenic angles.  The thorax. Calcification and torsion of the aorta.  Degenerative changes in the spine and shoulders.  Overall stable appearance of the chest since previous study.  IMPRESSION: Cardiac enlargement.  No evidence of active pulmonary disease.  Original Report Authenticated By:  Marlon Pel, M.D.   Dg Hip Complete Left  07/01/2011  *RADIOLOGY REPORT*  Clinical Data: Larey Seat and injured  left hip.  LEFT HIP - COMPLETE 2+ VIEW 07/01/2011:  Comparison: None.  Findings: Comminuted subcapital left femoral neck fracture.  Hip joint anatomically aligned with moderate axial joint space narrowing.  No other fractures.  Included AP pelvis demonstrates an intact contralateral right hip with symmetric moderate axial joint space narrowing.  Sacroiliac joints and symphysis pubis intact.  Degenerative changes involving the visualized lower lumbar spine.  Osteopenia.  IMPRESSION: Comminuted subcapital left femoral neck fracture.  Moderate osteoarthritis.  Original Report Authenticated By: Arnell Sieving, M.D.   Dg Pelvis Portable  07/02/2011  *RADIOLOGY REPORT*  Clinical Data: Postop  PORTABLE PELVIS  Comparison: 07/01/2011  Findings: Left hip hemiarthroplasty has been placed.  Anatomic alignment of the osseous and prosthetic structures.  No breakage or loosening of the hardware.  IMPRESSION: Left hip hemiarthroplasty anatomically aligned.  Original Report Authenticated By: Donavan Burnet, M.D.   Dg Hip Portable 1 View Left  07/02/2011  *RADIOLOGY REPORT*  Clinical Data: Postop  PORTABLE LEFT HIP - 1 VIEW  Comparison: Yesterday  Findings: Left hip hemiarthroplasty placed.  Anatomic alignment of the osseous and prosthetic structures.  No breakage or loosening of the hardware.  IMPRESSION: Left hip hemiarthroplasty anatomically aligned.  Original Report Authenticated By: Donavan Burnet, M.D.     Disposition: Inpatient rehabilitation  Diet: Heart healthy diet  Activity: Resume as tolerated.   Follow-up Appts: Discharge Orders    Future Appointments: Provider: Department: Dept Phone: Center:   07/11/2011 10:45 AM Gardiner Rhyme, MD [facility] [phone] [id number]      TESTS THAT NEED FOLLOW-UP None  Time spent on discharge, talking to the patient, and  coordinating care: 35 mins.   Signed: Cristal Ford, MD 07/05/2011, 1:50 PM

## 2011-07-05 NOTE — Progress Notes (Signed)
Have bed availability and would like at CIR today.  Thanks-Verna Hamon

## 2011-07-05 NOTE — Progress Notes (Signed)
Physical Therapy Treatment Patient Details Name: [name] MRN: [id number] DOB: [birth year] Today's Date: 07/05/2011 Time: 1478-2956 Charge: Leonia Reeves PT Assessment/Plan  PT - Assessment/Plan Comments on Treatment Session: Pt progressing with transfers however, still not able to ambulate out of room yet. PT Plan: Discharge plan remains appropriate;Frequency remains appropriate Follow Up Recommendations: Inpatient Rehab Equipment Recommended: Defer to next venue PT Goals  Acute Rehab PT Goals PT Goal: Supine/Side to Sit - Progress: Progressing toward goal PT Goal: Sit to Stand - Progress: Progressing toward goal PT Transfer Goal: Bed to Chair/Chair to Bed - Progress: Progressing toward goal PT Goal: Ambulate - Progress: Progressing toward goal  PT Treatment Precautions/Restrictions  Precautions Precautions: Posterior Hip;Fall Required Braces or Orthoses: Yes Knee Immobilizer: On except when in CPM (KI in bed unless CPM) Restrictions Weight Bearing Restrictions: Yes LLE Weight Bearing: Weight bearing as tolerated Other Position/Activity Restrictions: WBAT per orders Mobility (including Balance) Bed Mobility Bed Mobility: Yes Supine to Sit: 3: Mod assist;HOB elevated (Comment degrees) Supine to Sit Details (indicate cue type and reason): assist for trunk and L LE off of bed, verbal cues for technique Sitting - Scoot to Edge of Bed: 2: Max assist Sitting - Scoot to [place] of Bed Details (indicate cue type and reason): assist to move hips to EOB Transfers Transfers: Yes Sit to Stand: 1: +2 Total assist;With upper extremity assist;From bed Sit to Stand Details (indicate cue type and reason): pt=85%, verbal cues for hand placement, continues to need support to bring body weight over COG to decrease posterior lean Stand to Sit: 1: +2 Total assist;With armrests;To chair/3-in-1 Stand to Sit Details: pt=85%, verbal cues for hand placement and L LE forward, assist to control  descent Ambulation/Gait Ambulation/Gait: Yes Ambulation/Gait Assistance: 1: +2 Total assist Ambulation/Gait Assistance Details (indicate cue type and reason): pt=85%, pt able to take 4 steps with verbal cues for sequence, pt did much better with ambulating correctly today (followed precautions).  pt fatigued quickly and requested recliner. Ambulation Distance (Feet): 2 Feet Assistive device: Rolling walker Gait Pattern: Antalgic;Step-to pattern    Exercise    End of Session PT - End of Session Equipment Utilized During Treatment: Gait belt Activity Tolerance: Patient limited by fatigue Patient left: in chair;with call bell in reach;with family/visitor present Nurse Communication: Need for lift equipment General Behavior During Session: [facility] for tasks performed Cognition: Impaired (cognition better today.  orientated x3 but confused at times)  Maida Sale E 07/05/2011, 12:17 PM Pager: [phone]

## 2011-07-06 DIAGNOSIS — W19XXXA Unspecified fall, initial encounter: Secondary | ICD-10-CM

## 2011-07-06 DIAGNOSIS — I10 Essential (primary) hypertension: Secondary | ICD-10-CM

## 2011-07-06 DIAGNOSIS — M109 Gout, unspecified: Secondary | ICD-10-CM

## 2011-07-06 DIAGNOSIS — D62 Acute posthemorrhagic anemia: Secondary | ICD-10-CM

## 2011-07-06 DIAGNOSIS — Z5189 Encounter for other specified aftercare: Secondary | ICD-10-CM

## 2011-07-06 DIAGNOSIS — S72009A Fracture of unspecified part of neck of unspecified femur, initial encounter for closed fracture: Secondary | ICD-10-CM

## 2011-07-06 LAB — CBC
HCT: 27.3 % — ABNORMAL LOW (ref 36.0–46.0)
Hemoglobin: 9 g/dL — ABNORMAL LOW (ref 12.0–15.0)
MCHC: 33 g/dL (ref 30.0–36.0)
MCV: 86.9 fL (ref 78.0–100.0)
RDW: 13.6 % (ref 11.5–15.5)

## 2011-07-06 LAB — URINALYSIS, ROUTINE W REFLEX MICROSCOPIC
Leukocytes, UA: NEGATIVE
Nitrite: NEGATIVE
Specific Gravity, Urine: 1.007 (ref 1.005–1.030)
pH: 7.5 (ref 5.0–8.0)

## 2011-07-06 LAB — COMPREHENSIVE METABOLIC PANEL
Albumin: 2.4 g/dL — ABNORMAL LOW (ref 3.5–5.2)
BUN: 12 mg/dL (ref 6–23)
Chloride: 104 mEq/L (ref 96–112)
Creatinine, Ser: 0.67 mg/dL (ref 0.50–1.10)
GFR calc Af Amer: 86 mL/min — ABNORMAL LOW (ref >90)
Glucose, Bld: 90 mg/dL (ref 70–99)
Total Bilirubin: 0.4 mg/dL (ref 0.3–1.2)
Total Protein: 5.4 g/dL — ABNORMAL LOW (ref 6.0–8.3)

## 2011-07-06 LAB — DIFFERENTIAL
Basophils Relative: 0 % (ref 0–1)
Eosinophils Absolute: 0.3 10*3/uL (ref 0.0–0.7)
Monocytes Relative: 11 % (ref 3–12)
Neutro Abs: 4.5 10*3/uL (ref 1.7–7.7)
Neutrophils Relative %: 61 % (ref 43–77)

## 2011-07-06 LAB — PROTIME-INR
INR: 2.31 — ABNORMAL HIGH (ref 0.00–1.49)
Prothrombin Time: 25.8 seconds — ABNORMAL HIGH (ref 11.6–15.2)

## 2011-07-06 MED ORDER — WARFARIN SODIUM 2 MG PO TABS
2.0000 mg | ORAL_TABLET | Freq: Once | ORAL | Status: AC
  Administered 2011-07-06: 2 mg via ORAL
  Filled 2011-07-06 (×2): qty 1

## 2011-07-06 NOTE — Progress Notes (Signed)
Physical Therapy Note  Patient Details  Name: [name] MRN: [id number] Date of Birth: 08/24/1916 Today's Date: 07/06/2011  Individual therapy 1430-1525 (55 minutes) No complaint of pain, only "a little sore" in left lower extremity. Pt wanted to change into her underwear and put on some clothes. Bed mobility for supine to sit EOB with minA for left lower extremity assist. Worked on dressing EOB with assist to thread pant legs and underwear/depends through lower extremities and worked on dynamic standing balance to pull up pants and adjust clothing with steadyA. Pt able to take upper extremities off of RW once she had her balance to do this is with steadyA. Stand step transfer to w/c with minA, cues for technique, safety with RW, and posture to prevent posterior LOB. Functional strengthening and worked on technique for sit to stands x 5 reps with pt demonstrating good carryover. Gait training with RW with initially modA due to posterior lean, progressing to steadyA x 20' rest break and again x 25'. Pt with significant flexed posture despite manual and verbal cueing, most likely her "normal" posture.   Karolee Stamps [facility] 07/06/2011, 4:47 PM

## 2011-07-06 NOTE — Progress Notes (Signed)
Patient ID: [name], female   DOB: 1917-07-07, 77 y.o.   MRN: [id number] Subjective/Complaints: Review of Systems  Musculoskeletal: Positive for joint pain.  All other systems reviewed and are negative.   Had a really good night.  Pain is under pretty reasonable control.  Objective: Vital Signs: Blood pressure 115/66, pulse 64, temperature 98.6 F (37 C), temperature source Oral, resp. rate 20, height 5\' 4"  (1.626 m), weight 58.7 kg (129 lb 6.6 oz), SpO2 98.00%. No results found.  Basename 07/06/11 0520 07/05/11 0435  WBC 7.4 6.3  HGB 9.0* 8.0*  HCT 27.3* 24.0*  PLT 184 122*    Basename 07/05/11 0435  NA 140  K 4.2  CL 109  CO2 27  GLUCOSE 101*  BUN 9  CREATININE 0.69  CALCIUM 8.2*   CBG (last 3)  No results found for this basename: GLUCAP:3 in the last 72 hours  Wt Readings from Last 3 Encounters:  07/05/11 58.7 kg (129 lb 6.6 oz)  07/02/11 64.4 kg (141 lb 15.6 oz)  07/02/11 64.4 kg (141 lb 15.6 oz)    Physical Exam:  General appearance: alert and no distress Head: Normocephalic, without obvious abnormality, atraumatic Eyes: conjunctivae/corneas clear. PERRL, EOM's intact. Fundi benign. Ears: normal TM's and external ear canals both ears Nose: Nares normal. Septum midline. Mucosa normal. No drainage or sinus tenderness. Throat: lips, mucosa, and tongue normal; teeth and gums normal Neck: no adenopathy, no carotid bruit, no JVD, supple, symmetrical, trachea midline and thyroid not enlarged, symmetric, no tenderness/mass/nodules Back: symmetric, no curvature. ROM normal. No CVA tenderness. Resp: clear to auscultation bilaterally Cardio: regular rate and rhythm, S1, S2 normal, no murmur, click, rub or gallop GI: soft, non-tender; bowel sounds normal; no masses,  no organomegaly Extremities: extremities normal, atraumatic, no cyanosis or edema Pulses: 2+ and symmetric Skin: Skin color, texture, turgor normal. No rashes or lesions Neurologic: Grossly normal   Pt is oriented and cognitively appropriate this am.  Pain inhib weakness in left leg Incision/Wound: wound c/d/i    Leg appropriately tender   Assessment/Plan: 1. Functional deficits secondary to left FNF with hip hemi which require 3+ hours per day of interdisciplinary therapy in a comprehensive inpatient rehab setting. Physiatrist is providing close team supervision and 24 hour management of active medical problems listed below. Physiatrist and rehab team continue to assess barriers to discharge/monitor patient progress toward functional and medical goals. Mobility:         ADL:   Cognition: Cognition Orientation Level: Oriented to person;Oriented to place;Oriented to situation Cognition Orientation Level: Oriented to person;Oriented to place;Oriented to situation   1. DVT Prophylaxis/Anticoagulation: Continue coumadin. Serial INR's. 2. Pain Management: better with prn oxycodone prior to therapies. Family reports no worsening of cognition post narcotic doses.  3. Mood:Pleasant and appropriated. Motivated to get better.  4. Fracture, femur (07/02/2011) : Underwent left hip hemi on 12/09 and is WBAT  5. SSS: PPM in place. Continue cardizem   6. GOUT: Resume allopurinol to avoid flare up.   7. HYPOTHYROIDISM: Continue supplement.  8. HYPERTENSION: Monitor with bid checks. Continue cardizem 180 mg daily.  9. Sundowning/ Insomnia: Low dose xanax at bedtime effective. But having hallucinations at night. Monitor. May need low dose antipsychotic. Patient  disclosed to me that she was having some hallucinations at home prior to this admission but these were only mild. Fortunately, she had a good night.  Observe only for now. 10. Anemia: Resumed iron supplement. hgb improved today. No active signs of  bleeding.      LOS (Days) 1 A FACE TO FACE EVALUATION WAS PERFORMED  SWARTZ,ZACHARY T 07/06/2011, 7:40 AM

## 2011-07-06 NOTE — Progress Notes (Signed)
ANTICOAGULATION CONSULT NOTE - Follow Up Consult  Pharmacy Consult:  Coumadin Indication: VTE px s/p hip fx repair  Allergies  Allergen Reactions  . Aspirin   . Penicillins Rash    Patient Measurements: Height: 5\' 4"  (162.6 cm) Weight: 129 lb 6.6 oz (58.7 kg) IBW/kg (Calculated) : 54.7   Vital Signs: Temp: 98.6 F (37 C) (12/13 0656) Temp src: Oral (12/13 0656) BP: 115/66 mmHg (12/13 0656) Pulse Rate: 64  (12/13 0656)  Labs:  Basename 07/06/11 0520 07/05/11 0435 07/04/11 0455  HGB 9.0* 8.0* --  HCT 27.3* 24.0* 26.8*  PLT 184 122* 122*  APTT -- -- --  LABPROT 25.8* 26.0* 24.5*  INR 2.31* 2.34* 2.16*  HEPARINUNFRC -- -- --  CREATININE 0.67 0.69 --  CKTOTAL -- -- --  CKMB -- -- --  TROPONINI -- -- --   Estimated Creatinine Clearance: 38.7 ml/min (by C-G formula based on Cr of 0.67).   Assessment: 89 YOF on Coumadin for VTE px s/p hip fx repair.  INR therapeutic.  Goal of Therapy:  INR 2-3   Plan:  1.  Repeat Coumadin 2mg  PO today 2.  Daily PT/INR   Phillips Climes Dien 07/06/2011,10:45 AM

## 2011-07-06 NOTE — Progress Notes (Signed)
Physical Therapy Note  Patient Details  Name: NAKOMA GOTWALT MRN: [id number] Date of Birth: 22-Aug-1916 Today's Date: 07/06/2011  1600-1625 (25 minutes) individual treatment Pain - no complaint of pain Oxygen= 95 % 2LNC Focus of treatment: Left THA exercises X 20 in supine: pt required min/mod assist for sit to stand from wc; transfer stand pivot min assist; sit to supine mod assist Lt LE and trunk; AA ROM heel slides Lt LE; AA Lt hip abduction; SAQs , ankle pumps Locomotion: pt propelled wc 120 feet with supervision on level surfaces controlled environment   Kitana Gage,JIM 07/06/2011, 4:25 PM

## 2011-07-06 NOTE — Progress Notes (Signed)
Patient information reviewed and entered into [employer] system by Eder Macek, RN, CRRN, PPS Coordinator.  Information including medical coding and functional independence measure will be reviewed and updated through discharge.     Per nursing patient was given "Data Collection Information Summary for Patients in Inpatient Rehabilitation Facilities with attached "Privacy Act Statement-Health Care Records" upon admission.   

## 2011-07-06 NOTE — Progress Notes (Signed)
Occupational Therapy Assessment and Plan & Session Note  Patient Details  Name: [name] MRN: [id number] Date of Birth: [birth year]  OT Diagnosis: acute pain and muscle weakness (generalized) Rehab Potential: Rehab Potential: Good ELOS: 10-12 days   Today's Date: 07/06/2011  Assessment & Plan LUCCIA REINHEIMER is an 77 y.o. female who fell backwards while walking without her walker and sustained left femur fracture on 12/09. Patient evaluated by Dr. Luiz Blare and underwent left hip bipolar arthroplasty on the same day. Post op WBAT and on coumadin/lovenox bridge for DVT prophylaxis. Patient has had problems with confusion and hallucinations. MD, patient and family requesting CIR (as patient did well during/past her CIR stay last year) Patient recently transferred to CIR on 07/05/2011 .  Patient's past medical history is significant for: Complete heart block  s/p PPM by Dr Amil Amen 2004, recent Gen Change 07/13/10  Small bowel obstruction   Depression   Hypertension   Hypokalemia   Recurrent UTI   Gout   GERD (gastroesophageal reflux disease)  Duodenal ulcer  Cervical myelopathy with SCI 08/11  Chronic gait disorder.  Anemia   DVT (deep venous thrombosis)   Patient currently requires supervision for UB ADLs & Mod-Total assist for LB ADLs secondary to muscle weakness.  Prior to hospitalization, patient could complete ADLs and IADls with supervision to minimal assistance.  Patient will benefit from skilled intervention to increase independence with basic self-care skills and increase level of independence with iADL prior to discharge home with 24/7 supevision .  Anticipate patient will require 24 hour supervision and TBD if additional OT will be recommended at discharge.  OT - End of Session Activity Tolerance: Tolerates 30+ min activity with multiple rests OT Assessment Rehab Potential: Good Barriers to Discharge: Other (comment) Barriers to Discharge Comments: none known at this  time OT Plan OT Frequency: 1-2 X/day, 60-90 minutes Estimated Length of Stay: 10-12 days OT Treatment/Interventions: Ambulation/gait training;Balance/vestibular training;Community reintegration;DME/adaptive equipment instruction;Functional mobility training;Neuromuscular re-education;Pain management;Patient/family education;Self Care/advanced ADL retraining;Therapeutic Activities;Therapeutic Exercise;UE/LE Strength taining/ROM;UE/LE Coordination activities;Wheelchair propulsion/positioning OT Recommendation Recommendations for Other Services: Other (comment) (none at this time) Follow Up Recommendations: Other (comment) (OT: TBD) Equipment Details: tub bench?   Precautions/Restrictions  Precautions Precautions: Posterior Hip Required Braces or Orthoses: Yes (KI) Restrictions Weight Bearing Restrictions: Yes LLE Weight Bearing: Weight bearing as tolerated  General Chart Reviewed: Yes  Pain Pain Assessment Pain Assessment: 0-10 Pain Score:   5 Pain Type: Acute pain Pain Location: Hip Pain Orientation: Left Pain Intervention(s): RN made aware  Home Living/Prior Functioning Home Living Lives With: Alone Receives Help From: Family Type of Home: House Home Layout: One level Home Access: Ramped entrance Bathroom Shower/Tub: Engineer, manufacturing systems: Standard Bathroom Accessibility: Yes How Accessible: Accessible via walker Home Adaptive Equipment: Bedside commode/3-in-1;Hand-held shower hose;Hospital bed Additional Comments: family lives near patient.   Per patient report:  Patient lives alone; family lives close. Grandson, who lives next door, has connecting intercom with patient for communication back and forth.   ADL ADL Eating: Not assessed Grooming: Not assessed Upper Body Bathing: Supervision/safety;Setup Where Assessed-Upper Body Bathing: Edge of bed Lower Body Bathing: Maximal assistance Where Assessed-Lower Body Bathing: Edge of bed (sit to stand  position) Upper Body Dressing: Supervision/safety;Setup Where Assessed-Upper Body Dressing: Edge of bed Lower Body Dressing: Not assessed (no public clothing) Toileting: Not assessed Toilet Transfer: Not assessed Tub/Shower Transfer: Not assessed Film/video editor: Not assessed  Vision/Perception  Vision - History Baseline Vision: Wears glasses all the time Patient Visual  Report: No change from baseline   Cognition Overall Cognitive Status: Appears within functional limits for tasks assessed Arousal/Alertness: Awake/alert Orientation Level: Oriented X4  Sensation Sensation Light Touch: Appears Intact Coordination Gross Motor Movements are Fluid and Coordinated: Not tested Fine Motor Movements are Fluid and Coordinated: Not tested  Extremity/Trunk Assessment RUE Assessment RUE Assessment: Within Functional Limits LUE Assessment LUE Assessment: Within Functional Limits  Recommendations for other services: Other: none at this time  Discharge Criteria: Patient will be discharged from OT if patient refuses treatment 3 consecutive times without medical reason, if treatment goals not met, if there is a change in medical status, if patient makes no progress towards goals or if patient is discharged from hospital.  The above assessment, treatment plan, treatment alternatives and goals were discussed and mutually agreed upon: by patient  Session Note  4540-9811 - 60 Minutes Individual Therapy Patient complained of 5/10 pain; RN aware Initial 1:1 OT evaluation completed. Engaged in ADL retraining edge of bed with focus on bed mobility, dynamic sitting balance/tolerance (patient with minor posterior lean during sitting after ~10 minutes), UB/LB bathing (unable to dress; patient had no street clothes), sit to stand using rolling walker, and edge of bed to w/c stand pivot transfer. Left patient seated in w/c with phone and call bell within reach.   Esmond Hinch 07/06/2011,  11:03 AM

## 2011-07-06 NOTE — Progress Notes (Signed)
Physical Therapy Assessment and Plan  Patient Details  Name: [name] MRN: [id number] Date of Birth: 04-17-1917  PT Diagnosis: Abnormal posture, Difficulty walking, Edema, Muscle weakness and Pain in joint Rehab Potential: Good ELOS: 10-12 days   Today's Date: 07/06/2011 Time: 1100-1200 Time Calculation (min): 60 min  Assessment & Plan Clinical Impression: Patient is a 77 y.o. year old female who fell backwards while walking without her walker and sustained left femur fracture on 12/09. Patient evaluated by Dr. Luiz Blare and underwent left hip bipolar arthroplasty on the same day. Post op WBAT and on coumadin/lovenox bridge for DVT prophylaxis. Patient has had problems with confusion and hallucinations. Pt transferred to CIR on 07/05/2011 .  Patient's past medical history is significant for depression, GERD, cervical myelopathy with SCI 02/2010, chronic anemia, and gout.    Patient currently requires moderate assistance with mobility secondary to muscle weakness, decreased cardiorespiratoy endurance and decreased oxygen support and decreased standing balance, decreased balance strategies and difficulty maintaining precautions.  Prior to hospitalization, patient was supervision with mobility and lived with Alone in a House home.  Home access is  Ramped entrance.  Patient will benefit from skilled PT intervention to maximize safe functional mobility, minimize fall risk and decrease caregiver burden for planned discharge home with 24 hour supervision.  Anticipate patient will benefit from follow up HH at discharge.  PT Assessment Rehab Potential: Good PT Plan PT Frequency: 1-2 X/day, 60-90 minutes Estimated Length of Stay: 10-12 days PT Treatment/Interventions: Ambulation/gait training;Balance/vestibular training;DME/adaptive equipment instruction;Functional mobility training;Neuromuscular re-education;Pain management;Patient/family education;Stair training;Therapeutic  Activities;Therapeutic Exercise;UE/LE Strength taining/ROM;UE/LE Coordination activities;Wheelchair propulsion/positioning PT Recommendation Follow Up Recommendations: Home health PT;24 hour supervision/assistance Equipment Recommended:  (pt reports having rollator, RW, and w/c at home)  Precautions/Restrictions Precautions Precautions: Posterior Hip Weight Bearing Restrictions: Yes LLE Weight Bearing: Weight bearing as tolerated   Pain  Denies pain.  Home Living/Prior Functioning Home Living Lives With: Alone Receives Help From: Family Type of Home: House Home Layout: One level Home Access: Ramped entrance Bathroom Shower/Tub: Engineer, manufacturing systems: Standard Bathroom Accessibility: Yes How Accessible: Accessible via walker Home Adaptive Equipment: Bedside commode/3-in-1;Hand-held shower hose;Hospital bed, RW, w/c, rollator Additional Comments: family lives near patient.  Vision/Perception  Vision - History Baseline Vision: Wears glasses all the time Patient Visual Report: No change from baseline Perception Perception: Within Functional Limits  Cognition Overall Cognitive Status: Appears within functional limits for tasks assessed Arousal/Alertness: Awake/alert Orientation Level: Oriented X4 Sensation Sensation Light Touch: Appears Intact Coordination Gross Motor Movements are Fluid and Coordinated: Yes Fine Motor Movements are Fluid and Coordinated: Not tested Motor  Motor Motor:  (generalized weakness)  Mobility Bed Mobility Bed Mobility: Yes Supine to Sit: 3: Mod assist Supine to Sit Details: Verbal cues for technique;Verbal cues for precautions/safety;Manual facilitation for placement Sit to Supine - Left: 4: Min assist Transfers Transfers: Yes Sit to Stand: 3: Mod assist Stand to Sit: 3: Mod assist;4: Min assist Stand Pivot Transfers: 3: Mod assist Locomotion  Ambulation Ambulation: Yes Ambulation/Gait Assistance: 3: Mod assist Ambulation  Distance (Feet): 10 Feet Assistive device: Rolling walker Ambulation/Gait Assistance Details: Verbal cues for technique;Other (comment);Verbal cues for safe use of DME/AE (manual facilitation of RW initially) Gait Gait Pattern: Step-to pattern;Antalgic Stairs / Additional Locomotion Stairs: No Corporate treasurer: Yes Wheelchair Assistance: 4: Systems analyst: Both upper extremities Wheelchair Parts Management: Needs assistance Distance: 100  Trunk/Postural Assessment  Cervical Assessment Cervical Assessment: Exceptions to [facility] (forward head) Thoracic Assessment Thoracic Assessment: Exceptions to  WFL (flexed)  Balance Balance Balance Assessed: Yes Static Sitting Balance Static Sitting - Level of Assistance: 5: Stand by assistance Dynamic Sitting Balance Dynamic Sitting - Level of Assistance: 5: Stand by assistance Static Standing Balance Static Standing - Level of Assistance: 3: Mod assist;4: Min assist (initally modA due to posterior lean, otherwise minA) Dynamic Standing Balance Dynamic Standing - Level of Assistance: 3: Mod assist Extremity Assessment  RUE Assessment RUE Assessment: Within Functional Limits LUE Assessment LUE Assessment: Within Functional Limits RLE Strength RLE Overall Strength Comments: grossly 3+/5, ROM WFL LLE Strength LLE Overall Strength Comments: strength grossly 3-/5, pain limiting movement as well  Recommendations for other services: None  Discharge Criteria: Patient will be discharged from PT if patient refuses treatment 3 consecutive times without medical reason, if treatment goals not met, if there is a change in medical status, if patient makes no progress towards goals or if patient is discharged from hospital.  The above assessment, treatment plan, treatment alternatives and goals were discussed and mutually agreed upon: by patient   Treatment initiated this session with focus on functional transfer  training with use of RW as well as gait training for general mobility training. Pt with significantly forward flexed posture during gait, cues for upright posture and assistance managing RW initially but showed improvement. Pt requires extra time for mobility.  Karolee Stamps [facility] 07/06/2011, 12:22 PM

## 2011-07-06 NOTE — Progress Notes (Signed)
Patient Details  Name: [name] MRN: [id number] Date of Birth: [birth year]  Today's Date: 07/06/2011  Met with pt briefly today.  Pt c/o fatigue from previous therapies and requested time to rest.  TR eval to be completed early next week. No c/o pain. [name] 07/06/2011, 5:01 PM

## 2011-07-07 DIAGNOSIS — Z5189 Encounter for other specified aftercare: Secondary | ICD-10-CM

## 2011-07-07 DIAGNOSIS — S72009A Fracture of unspecified part of neck of unspecified femur, initial encounter for closed fracture: Secondary | ICD-10-CM

## 2011-07-07 DIAGNOSIS — W19XXXA Unspecified fall, initial encounter: Secondary | ICD-10-CM

## 2011-07-07 DIAGNOSIS — M109 Gout, unspecified: Secondary | ICD-10-CM

## 2011-07-07 DIAGNOSIS — D62 Acute posthemorrhagic anemia: Secondary | ICD-10-CM

## 2011-07-07 DIAGNOSIS — I10 Essential (primary) hypertension: Secondary | ICD-10-CM

## 2011-07-07 LAB — URINE CULTURE: Culture  Setup Time: 201212130921

## 2011-07-07 LAB — PROTIME-INR: Prothrombin Time: 28.5 seconds — ABNORMAL HIGH (ref 11.6–15.2)

## 2011-07-07 MED ORDER — PATIENT'S GUIDE TO USING COUMADIN BOOK
Freq: Once | Status: DC
  Filled 2011-07-07 (×2): qty 1

## 2011-07-07 MED ORDER — WARFARIN SODIUM 1 MG PO TABS
1.0000 mg | ORAL_TABLET | Freq: Once | ORAL | Status: AC
Start: 2011-07-07 — End: 2011-07-07
  Administered 2011-07-07: 1 mg via ORAL
  Filled 2011-07-07: qty 1

## 2011-07-07 MED ORDER — WARFARIN VIDEO
Freq: Once | Status: AC
  Administered 2011-07-07: 16:00:00

## 2011-07-07 NOTE — Progress Notes (Signed)
ANTICOAGULATION CONSULT NOTE - Follow Up Consult  Pharmacy Consult:  Coumadin Indication: VTE px s/p hip fx repair  Allergies  Allergen Reactions  . Aspirin   . Penicillins Rash    Patient Measurements: Height: 5\' 4"  (162.6 cm) Weight: 129 lb 6.6 oz (58.7 kg) IBW/kg (Calculated) : 54.7   Vital Signs: Temp: 98.5 F (36.9 C) (12/14 0616) Temp src: Oral (12/14 0616) BP: 148/52 mmHg (12/14 0616) Pulse Rate: 70  (12/14 0616)  Labs:  Basename 07/07/11 0627 07/06/11 0520 07/05/11 0435  HGB -- 9.0* 8.0*  HCT -- 27.3* 24.0*  PLT -- 184 122*  APTT -- -- --  LABPROT 28.5* 25.8* 26.0*  INR 2.63* 2.31* 2.34*  HEPARINUNFRC -- -- --  CREATININE -- 0.67 0.69  CKTOTAL -- -- --  CKMB -- -- --  TROPONINI -- -- --   Estimated Creatinine Clearance: 38.7 ml/min (by C-G formula based on Cr of 0.67).   Assessment: 89 YOF on Coumadin for VTE px s/p hip fx repair.  INR therapeutic.  Goal of Therapy:  INR 2-3   Plan:  1.  Repeat Coumadin 1mg  PO today 2.  Daily PT/INR   Mickeal Skinner 07/07/2011,12:33 PM

## 2011-07-07 NOTE — Progress Notes (Signed)
Patient ID: [name], female   DOB: 10-27-1916, 77 y.o.   MRN: [id number] Patient ID: NISA DECAIRE, female   DOB: January 09, 1917, 77 y.o.   MRN: [id number] Subjective/Complaints: Review of Systems  Musculoskeletal: Positive for joint pain.  All other systems reviewed and are negative.   Had a really good night without hallucinations  Objective: Vital Signs: Blood pressure 148/52, pulse 70, temperature 98.5 F (36.9 C), temperature source Oral, resp. rate 18, height 5\' 4"  (1.626 m), weight 58.7 kg (129 lb 6.6 oz), SpO2 98.00%. No results found.  Basename 07/06/11 0520 07/05/11 0435  WBC 7.4 6.3  HGB 9.0* 8.0*  HCT 27.3* 24.0*  PLT 184 122*    Basename 07/06/11 0520 07/05/11 0435  NA 138 140  K 4.1 4.2  CL 104 109  CO2 27 27  GLUCOSE 90 101*  BUN 12 9  CREATININE 0.67 0.69  CALCIUM 8.2* 8.2*   CBG (last 3)  No results found for this basename: GLUCAP:3 in the last 72 hours  Wt Readings from Last 3 Encounters:  07/05/11 58.7 kg (129 lb 6.6 oz)  07/02/11 64.4 kg (141 lb 15.6 oz)  07/02/11 64.4 kg (141 lb 15.6 oz)    Physical Exam:  General appearance: alert and no distress Head: Normocephalic, without obvious abnormality, atraumatic Eyes: conjunctivae/corneas clear. PERRL, EOM's intact. Fundi benign. Ears: normal TM's and external ear canals both ears Nose: Nares normal. Septum midline. Mucosa normal. No drainage or sinus tenderness. Throat: lips, mucosa, and tongue normal; teeth and gums normal Neck: no adenopathy, no carotid bruit, no JVD, supple, symmetrical, trachea midline and thyroid not enlarged, symmetric, no tenderness/mass/nodules Back: symmetric, no curvature. ROM normal. No CVA tenderness. Resp: clear to auscultation bilaterally Cardio: regular rate and rhythm, S1, S2 normal, no murmur, click, rub or gallop GI: soft, non-tender; bowel sounds normal; no masses,  no organomegaly Extremities: extremities normal, atraumatic, no cyanosis or edema Pulses:  2+ and symmetric Skin: Skin color, texture, turgor normal. No rashes or lesions Neurologic: Grossly normal  Pt is oriented and cognitively appropriate this am.  Pain inhib weakness in left leg Incision/Wound: wound c/d/i    Leg appropriately tender   Assessment/Plan: 1. Functional deficits secondary to left FNF with hip hemi which require 3+ hours per day of interdisciplinary therapy in a comprehensive inpatient rehab setting. Physiatrist is providing close team supervision and 24 hour management of active medical problems listed below. Physiatrist and rehab team continue to assess barriers to discharge/monitor patient progress toward functional and medical goals. Mobility: Bed Mobility Bed Mobility: Yes Supine to Sit: 3: Mod assist Sit to Supine - Left: 4: Min assist Transfers Transfers: Yes Sit to Stand: 3: Mod assist Stand to Sit: 3: Mod assist;4: Min assist Stand Pivot Transfers: 3: Mod assist Ambulation/Gait Ambulation/Gait Assistance: 3: Mod assist Ambulation Distance (Feet): 10 Feet Assistive device: Rolling walker Gait Pattern: Step-to pattern;Antalgic Stairs: No Corporate treasurer: Yes Wheelchair Assistance: 4: Systems analyst: Both upper extremities Wheelchair Parts Management: Needs assistance Distance: 100 ADL:   Cognition: Cognition Overall Cognitive Status: Appears within functional limits for tasks assessed Arousal/Alertness: Awake/alert Orientation Level: Oriented X4 Cognition Arousal/Alertness: Awake/alert Orientation Level: Oriented X4   1. DVT Prophylaxis/Anticoagulation: Continue coumadin. Serial INR's. HGB stable 2. Pain Management: better with prn oxycodone prior to therapies. Family reports no worsening of cognition post narcotic doses.  3. Mood:Pleasant and appropriated. Motivated to get better.  4. Fracture, femur (07/02/2011) : Underwent left hip hemi on 12/09 and  is WBAT  5. SSS: PPM in place. Continue  cardizem   6. GOUT: Resume allopurinol to avoid flare up.   7. HYPOTHYROIDISM: Continue supplement.  8. HYPERTENSION: Monitor with bid checks. Continue cardizem 180 mg daily.  9. Sundowning/ Insomnia: Low dose xanax at bedtime effective. But having hallucinations at night. Monitor. May need low dose antipsychotic. Patient  disclosed to me that she was having some hallucinations at home prior to this admission but these were only mild. No sx so far during this admission.  Continue to observe.  Avoid neurosedating medications 10. Anemia: Resumed iron supplement. hgb improved. No active signs of bleeding.      LOS (Days) 2 A FACE TO FACE EVALUATION WAS PERFORMED  SWARTZ,ZACHARY T 07/07/2011, 7:14 AM

## 2011-07-07 NOTE — Progress Notes (Signed)
Physical Therapy Note  Patient Details  Name: [name] MRN: [id number] Date of Birth: April 05, 1917 Today's Date: 07/07/2011  11:15- 12:05:  Individual therapy pt. Initially stated pain 4/10 at rest and pain increased with exercises. Asked nurse for medication and ice applied.  Gait training 2x 10' with RW min assist and VC for left toe pointing towards wheel to decrease hip IR. And VC for walker placement with backing.  All bed mobility max assist with assist to prevent left hip IR.Marland Kitchen Left quad sets, ankle pumps and passive calf stretching 3 x 30seconds.    Julian Reil 07/07/2011, 12:31 PM

## 2011-07-07 NOTE — Progress Notes (Signed)
Social Work Assessment and Plan Assessment and Plan  Patient Name: [name]  NWGNF'A Date: 07/07/2011  Problem List:  Patient Active Problem List  Diagnoses  . Chest pain, unspecified  . Small bowel obstruction  . Pacemaker  . Depression  . AV block  . Recurrent UTI  . SSS (sick sinus syndrome)  . GOUT  . GERD (gastroesophageal reflux disease)  . DVT (deep venous thrombosis)  . HYPOTHYROIDISM  . HYPOKALEMIA  . ANEMIA  . HYPERTENSION  . Fracture, femur  . Acute delirium    Past Medical History:  Past Medical History  Diagnosis Date  . Complete heart block     s/p PPM by Dr Amil Amen 2004, recent Gen Change  07/13/10  . Small bowel obstruction   . Depression   . Hypertension   . Hypokalemia   . Recurrent UTI   . Gout   . GERD (gastroesophageal reflux disease)   . DVT (deep venous thrombosis)     Past Surgical History:  Past Surgical History  Procedure Date  . Insert / replace / remove pacemaker     initial pacemaker by Dr Amil Amen 2004, replaced by [facility] for ERI 07/13/10  . Hip arthroplasty 07/02/2011    Procedure: ARTHROPLASTY BIPOLAR HIP;  Surgeon: Harvie Junior;  Location: [facility] ORS;  Service: Orthopedics;  Laterality: Left;    Discharge Planning  Discharge Planning Case Management Consult Needed: Yes (Comment) (following)  Social/Family/Support Systems Social/Family/Support Systems Anticipated Caregiver's Contact Information: see above info Ability/Limitations of Caregiver: no limitations  Employment Status Employment Status Employment Status: Retired Date Retired/Disabled/Unemployed: " a long time"  Abuse/Neglect Abuse/Neglect Assessment (Assessment to be complete while patient is alone) Physical Abuse: Denies Verbal Abuse: Denies Sexual Abuse: Denies Exploitation of patient/patient's resources: Denies Self-Neglect: Denies  Emotional Status Emotional Status Pt's affect, behavior adn adjustment status: very pleasant, talkative, fully  oriented woman familiar to this SW from previous CIR stay (8/11).  While she admits general frustration with fall and another hospitalization ("...it was just stupidity that caused it") she denies any significant emotional distress.  No s/s of depression or anxiety.  No need to complete depression screen indicated.   Recent Psychosocial Issues: Pt. reports she has been managing well at home since prior CIR stay. Pyschiatric History: None  Patient/Family Perceptions, Expectations & Goals Pt/Family Perceptions, Expectations and Goals Pt/Family understanding of illness & functional limitations: pt and family with good understanding of injury suffered in fall and surgery performed.  Pt know hip precautions to follow and WBAT status. Premorbid pt/family roles/activities: Pt. fairly independent at home but does have sitter with her throughout the day and then uses intercom to get assist from grandson in evening if needed. Anticipated changes in roles/activities/participation: little change anticipated Pt/family expectations/goals: "just want to get back home as soon as I can so I don' lose my help"  Radio producer Agencies: None Premorbid Home Care/DME Agencies: Other (Comment) [facility]) Transportation available at discharge: yes  Discharge Assessment Discharge Planning Insurance Resources: Medicare;Insurance Case Production designer, theatre/television/film (specify name) Building services engineer) Financial Resources: Social Security Financial Screen Referred: No Living Expenses: Own Money Management: Patient Home Management: pt, caregiver and family Patient/Family Preliminary Plans: Pt plans to return home and resume daily assistance of caregiver, [name], with family assisting prn at night  Clinical Impression:  Pleasant woman familiar to CIR from prior stays.  Humorous, talkative and very motivated to complete CIR and get home ASAP.  No significant emotional distress noted.  Will monitor.  Effie Janoski,  Amada Jupiter, Donnavin Vandenbrink 07/07/2011

## 2011-07-07 NOTE — Progress Notes (Addendum)
[facility]patient Rehabilitation Center Individual Statement of Services  Patient Name:  [name]  Date:  07/07/2011  Welcome to the Inpatient Rehabilitation Center.  Our goal is to provide you with an individualized program based on your diagnosis and situation, designed to meet your specific needs.  With this comprehensive rehabilitation program, you will be expected to participate in at least 3 hours of rehabilitation therapies Monday-Friday, with modified therapy programming on the weekends.  Your rehabilitation program will include the following services:  Physical Therapy (PT), Occupational Therapy (OT), 24 hour per day rehabilitation nursing, Therapeutic Recreaction (TR), Case Management (RN and Child psychotherapist), Rehabilitation Medicine, Nutrition Services and Pharmacy Services  Weekly team conferences will be held on Tuesdays to discuss your progress.  Your RN Case Designer, television/film set will talk with you frequently to get your input and to update you on team discussions.  Team conferences with you and your family in attendance may also be held.  Expected length of stay: 10-12 days   Overall predicted outcome:Supervision  Depending on your progress and recovery, your program may change.  Your RN Case Estate agent will coordinate services and will keep you informed of any changes.  Your RN Sports coach and SW names and contact numbers are listed  below.  The following services may also be recommended but are not provided by the Inpatient Rehabilitation Center:   Driving Evaluations  Home Health Rehabiltiation Services  Outpatient Rehabilitatation [facility]  Vocational Rehabilitation   Arrangements will be made to provide these services after discharge if needed.  Arrangements include referral to agencies that provide these services.  Your insurance has been verified to be: Medicare + AARP Your primary doctor is:  Dr. Benedetto Goad  Pertinent information will be shared  with your doctor and your insurance company.  Case Manager: Melanee Spry, [facility] [phone]  Social Worker:  [place], [place] [phone]  Information discussed with pt and dtr-in-law and copy given to patient by: Meryl Dare, 07/07/2011

## 2011-07-07 NOTE — Progress Notes (Signed)
Physical Therapy Note  Patient Details  Name: [name] MRN: [id number] Date of Birth: 06-15-1917 Today's Date: 07/07/2011  13:15- 14:00:  Individual therapy, pt. Denies pain.  Rt. Leg heel slides, SAQ, hip abduction in supine 10 each, gait training 70' controlled RW min assist with VC for posture, left foot ER, and walker placement.  Practiced bed mobility on real bed with mod assist.   Julian Reil 07/07/2011, 3:37 PM

## 2011-07-07 NOTE — Progress Notes (Signed)
Occupational Therapy Session Note  Patient Details  Name: [name] MRN: [id number] Date of Birth: 12/17/1916  Today's Date: 07/07/2011 Time: 7829-5621 Time Calculation (min): 30 min  Precautions: Precautions Precautions: Posterior Hip Restrictions Weight Bearing Restrictions: Yes LLE Weight Bearing: Weight bearing as tolerated  Short Term Goals: OT Short Term Goal 1: Patient will complete LB dressing with minimal assist using AE prn OT Short Term Goal 2: Patient will complete toilet transfer using DME prn with minimal assist OT Short Term Goal 3: Patient will maintain dynamic standing balance with supervision during activities of daily living (LTG)  Skilled Therapeutic Interventions/Progress Updates:    Focus on functional ambulation with r/w for home mgmt tasks and practicing tub bench transfers.  Pt required assistance lifting LLE into and out of tub, and assistance with standing from sitting position.  Pt exhibits SOB with limited activity but O2 sats>90% on RA after activity.    Pain Pain Assessment Pain Assessment: No/denies pain Pain Score: 0-No pain (pretherapy medication, denies pain)    Therapy/Group: Individual Therapy  Rich Brave 07/07/2011, 3:35 PM

## 2011-07-07 NOTE — Progress Notes (Signed)
Occupational Therapy Session Note  Patient Details  Name: [name] MRN: [id number] Date of Birth: 1917-03-15  Today's Date: 07/07/2011 Time: 4098-1191 Time Calculation (min): 60 min  Precautions: Precautions Precautions: Posterior Hip Restrictions Weight Bearing Restrictions: Yes LLE Weight Bearing: Weight bearing as tolerated  Short Term Goals: OT Short Term Goal 1: Patient will complete LB dressing with minimal assist using AE prn OT Short Term Goal 2: Patient will complete toilet transfer using DME prn with minimal assist OT Short Term Goal 3: Patient will maintain dynamic standing balance with supervision during activities of daily living (LTG)  Skilled Therapeutic Interventions/Progress Updates:    ADL retraining with focus on dressing only with sit to stand from w/c at sink. Pt in bed upon arrival; mod A for supine to sit EOB.  Stand pivot transfer with r/w to w/c.  Pt requires lifting and lowering assist with sit to stand to pull up pants.  AE provided with instructions on use of sock aide to assist with socks.  Pt has caregiver at home who will assist.  Practiced tub bench transfer.  Pt unable to get legs into tub; practiced only stand to sit and sit to stand from tub bench with mod/max assist.  O2 sats >90% on RA during therapy.  Focus on activity tolerance, sit to stand, LB dressing, tub bench transfers, and safety awareness.     Pain Pain Assessment Pain Assessment: No/denies pain  Therapy/Group: Individual Therapy  Rich Brave 07/07/2011, 10:57 AM

## 2011-07-08 DIAGNOSIS — Z5189 Encounter for other specified aftercare: Secondary | ICD-10-CM

## 2011-07-08 DIAGNOSIS — D62 Acute posthemorrhagic anemia: Secondary | ICD-10-CM

## 2011-07-08 DIAGNOSIS — I10 Essential (primary) hypertension: Secondary | ICD-10-CM

## 2011-07-08 DIAGNOSIS — W19XXXA Unspecified fall, initial encounter: Secondary | ICD-10-CM

## 2011-07-08 DIAGNOSIS — M109 Gout, unspecified: Secondary | ICD-10-CM

## 2011-07-08 DIAGNOSIS — S72009A Fracture of unspecified part of neck of unspecified femur, initial encounter for closed fracture: Secondary | ICD-10-CM

## 2011-07-08 LAB — PROTIME-INR: INR: 2.91 — ABNORMAL HIGH (ref 0.00–1.49)

## 2011-07-08 MED ORDER — SENNOSIDES-DOCUSATE SODIUM 8.6-50 MG PO TABS
2.0000 | ORAL_TABLET | Freq: Two times a day (BID) | ORAL | Status: DC
  Administered 2011-07-08 – 2011-07-12 (×10): 2 via ORAL
  Filled 2011-07-08 (×12): qty 2

## 2011-07-08 MED ORDER — WARFARIN 0.5 MG HALF TABLET
0.5000 mg | ORAL_TABLET | Freq: Once | ORAL | Status: AC
  Administered 2011-07-08: 0.5 mg via ORAL
  Filled 2011-07-08 (×2): qty 1

## 2011-07-08 NOTE — Progress Notes (Signed)
Physical Therapy Note  Patient Details  Name: SHUNTA MCLAURIN MRN: [id number] Date of Birth: September 11, 1916 Today's Date: 07/08/2011  Time: 4098-1191 Pain: currently 0/10 and was premedicated Precautions: Posterior Hip Precautions on Left  Therapeutic Exercise; (8') Supine L LE AAROM heel slides, and AROM ankle pumps, quad sets, gluteal sets Therapeutic Activity; (15')  Transfer training sit<->stand with min-A and verbal cueing for hand placement and safety          Sit->supine with Mod-A B LE's and Mod-A for scooting lateral in bed and toward HOB. Gait training with RW with initially min-A due to posterior lean but able to progress to Close SBA, gait x 75' and verbal          cues for safety positioning inside RW.  Individual Treatment Session (38')   Jodelle Gross 07/08/2011, 9:44 AM

## 2011-07-08 NOTE — Progress Notes (Signed)
Physical Therapy Note  Patient Details  Name: SEMIYAH NEWGENT MRN: [id number] Date of Birth: Sep 15, 1916 Today's Date: 07/08/2011 Time; 1600-1645 Pain; patient reporting no pain in L hip throughout treatment session. Precautions; WBAT L LE, L posterior hip precautions  Therapeutic Exercise; (15') Supine B LE exercises including ankle pumps, quad setting, gluteal setting, heel slides and AAROM L hip abduction  Therapeutic Activity; (15')  Bed mobility with min-A for L LE for supine to sit to EOB,   Transfer training sit/stand into RW with min-A to steady with initial weight bearing and for stability of RW.    Gait Training; (15') using RW 2 x 120' min-A with one episode of L LE giving way slightly but patient mostly able to regain balance.  Patient had no c/o pain throughout treatment session.  Patient needing verbal and tactile cues for positioning inside RW during gait. Patient also needing verbal cues to breathe throughout gait and exercise activities as she tends to hold her breath.  Individual Treatment Session  Jodelle Gross 07/08/2011, 4:00 PM

## 2011-07-08 NOTE — Progress Notes (Signed)
Occupational Therapy Session Note  Patient Details  Name: TREASURE [facility] MRN: [id number] Date of Birth: May 27, 1917  Today's Date: 07/08/2011 Time: 1300-1400 Time Calculation (min): 60 min  Precautions: Precautions Precautions: Posterior Hip Required Braces or Orthoses: Yes (KI) Restrictions Weight Bearing Restrictions: Yes LLE Weight Bearing: Weight bearing as tolerated  Short Term Goals: OT Short Term Goal 1: Patient will complete LB dressing with minimal assist using AE prn OT Short Term Goal 2: Patient will complete toilet transfer using DME prn with minimal assist OT Short Term Goal 3: Patient will maintain dynamic standing balance with supervision during activities of daily living (LTG)  Skilled Therapeutic Interventions/Progress Updates: AM session = 55 minutes and pm session = 60 minutes    Am session:  Bathing/dressing in w/c at sink with focus on adhering to THP and using reacher to don pants and depends over hips.  Patient stated that paid caregiver completes her LB B/D;  Patient with difficulty not allowing left knee or left foot to internally rotate - required approx 4 reminders during self care session;  PM session:  Endurance activities to strengthen upper body in order to increase self care and functional mobility adhering to THP; Though patient  Able to state all 3 THPs, during activities, she required 2 reminders to not internally rotate right foot and knee    Pain Pain Assessment Pain Score: 0-No pain ADL   Therapy/Group: Individual Therapy  Bud Face [facility] 07/08/2011, 1:16 PM

## 2011-07-08 NOTE — Progress Notes (Signed)
Patient alert and oriented x 3 - uses call bell appropriately this shift. Patient hard of hearing and has hearing aids. Patient continent of bowel and bladder with last bowel movement on 12/15 per tech report. Patient able to stand pivot with min-mod assist depending on level of fatigue. Patient WBAT to LLE. Patient staples to left hip approximated- area bruised- some yellow old dressing- possible dye from surgery. New abdominal pad applied to area. Patient taking medications whole with applesauce. Patient appetite good. Patient denies any pain and already on scheduled pain medications. Continue with plan of care.

## 2011-07-08 NOTE — Progress Notes (Signed)
ANTICOAGULATION CONSULT NOTE - Follow Up Consult  Pharmacy Consult:  Coumadin Indication: VTE px s/p hip fx repair  Allergies  Allergen Reactions  . Aspirin   . Penicillins Rash    Patient Measurements: Height: 5\' 4"  (162.6 cm) Weight: 129 lb 6.6 oz (58.7 kg) IBW/kg (Calculated) : 54.7   Vital Signs: Temp: 97.6 F (36.4 C) (12/15 0502) Temp src: Oral (12/15 0502) BP: 147/72 mmHg (12/15 0502) Pulse Rate: 68  (12/15 0502)  Labs:  Basename 07/08/11 0715 07/07/11 0627 07/06/11 0520  HGB -- -- 9.0*  HCT -- -- 27.3*  PLT -- -- 184  APTT -- -- --  LABPROT 30.9* 28.5* 25.8*  INR 2.91* 2.63* 2.31*  HEPARINUNFRC -- -- --  CREATININE -- -- 0.67  CKTOTAL -- -- --  CKMB -- -- --  TROPONINI -- -- --   Estimated Creatinine Clearance: 38.7 ml/min (by C-G formula based on Cr of 0.67).   Assessment: 89 YOF on Coumadin for VTE px s/p hip fx repair.  INR therapeutic.  Goal of Therapy:  INR 2-3   Plan:  1.  Coumadin 0.5 mg PO today 2.  Daily PT/INR   Mickeal Skinner 07/08/2011,10:05 AM

## 2011-07-08 NOTE — Progress Notes (Signed)
Patient ID: [name], female   DOB: October 09, 1916, 77 y.o.   MRN: [id number] Subjective/Complaints:   Review of Systems  Musculoskeletal: Positive for joint pain.  All other systems reviewed and are negative.   Objective: Vital Signs: Blood pressure 147/72, pulse 68, temperature 97.6 F (36.4 C), temperature source Oral, resp. rate 18, height 5\' 4"  (1.626 m), weight 58.7 kg (129 lb 6.6 oz), SpO2 96.00%. No results found.  Basename 07/06/11 0520  WBC 7.4  HGB 9.0*  HCT 27.3*  PLT 184    Basename 07/06/11 0520  NA 138  K 4.1  CL 104  CO2 27  GLUCOSE 90  BUN 12  CREATININE 0.67  CALCIUM 8.2*   CBG (last 3)  No results found for this basename: GLUCAP:3 in the last 72 hours  Wt Readings from Last 3 Encounters:  07/05/11 58.7 kg (129 lb 6.6 oz)  07/02/11 64.4 kg (141 lb 15.6 oz)  07/02/11 64.4 kg (141 lb 15.6 oz)    Physical Exam:  General appearance: alert and no distress Head: Normocephalic, without obvious abnormality, atraumatic Eyes: conjunctivae/corneas clear. PERRL, EOM's intact. Fundi benign. Ears: normal TM's and external ear canals both ears Nose: Nares normal. Septum midline. Mucosa normal. No drainage or sinus tenderness. Throat: lips, mucosa, and tongue normal; teeth and gums normal Neck: no adenopathy, no carotid bruit, no JVD, supple, symmetrical, trachea midline and thyroid not enlarged, symmetric, no tenderness/mass/nodules Back: symmetric, no curvature. ROM normal. No CVA tenderness. Resp: clear to auscultation bilaterally Cardio: regular rate and rhythm, S1, S2 normal, no murmur, click, rub or gallop GI: soft, non-tender; bowel sounds normal; no masses,  no organomegaly Extremities: extremities normal, atraumatic, no cyanosis or edema Pulses: 2+ and symmetric Skin: Skin color, texture, turgor normal. No rashes or lesions Neurologic: Grossly normal  Pt is oriented and cognitively appropriate this am.  Pain inhib weakness in left  leg Incision/Wound: wound c/d/i    Leg appropriately tender   Assessment/Plan: 1. Functional deficits secondary to left FNF with hip hemi which require 3+ hours per day of interdisciplinary therapy in a comprehensive inpatient rehab setting. Physiatrist is providing close team supervision and 24 hour management of active medical problems listed below. Physiatrist and rehab team continue to assess barriers to discharge/monitor patient progress toward functional and medical goals. Mobility: Bed Mobility Bed Mobility: Yes Supine to Sit: 3: Mod assist Sit to Supine - Left: 4: Min assist Transfers Transfers: Yes Sit to Stand: 3: Mod assist Stand to Sit: 3: Mod assist;4: Min assist Stand Pivot Transfers: 3: Mod assist Ambulation/Gait Ambulation/Gait Assistance: 4: Min assist Ambulation Distance (Feet): 10 Feet Assistive device: Rolling walker Gait Pattern: Step-to pattern;Antalgic Stairs: No Corporate treasurer: Yes Wheelchair Assistance: 4: Systems analyst: Both upper extremities Wheelchair Parts Management: Needs assistance Distance: 100 ADL:   Cognition: Cognition Overall Cognitive Status: Appears within functional limits for tasks assessed Arousal/Alertness: Awake/alert Orientation Level: Oriented to person;Oriented to place;Oriented to time Cognition Arousal/Alertness: Awake/alert Orientation Level: Oriented to person;Oriented to place;Oriented to time   1. DVT Prophylaxis/Anticoagulation: Continue coumadin. Serial INR's. HGB stable 2. Pain Management: better with prn oxycodone prior to therapies. Family reports no worsening of cognition post narcotic doses.  3. Mood:Pleasant and appropriated. Motivated to get better.  4. Fracture, femur (07/02/2011) : Underwent left hip hemi on 12/09 and is WBAT  5. SSS: PPM in place. Continue cardizem   6. GOUT: Resume allopurinol to avoid flare up.   7. HYPOTHYROIDISM: Continue supplement.  8.  HYPERTENSION: Monitor with bid checks. Continue cardizem 180 mg daily.  9. Sundowning/ Insomnia: Low dose xanax at bedtime effective. But having hallucinations at night. Monitor. May need low dose antipsychotic. Patient  disclosed to me that she was having some hallucinations at home prior to this admission but these were only mild. No sx so far during this admission.  Continue to observe.  Avoid neurosedating medications 10. Anemia: Resumed iron supplement. hgb improved. No active signs of bleeding. 11. Constipation chronic plus pain meds Miralax, add docusate      LOS (Days) 3 A FACE TO FACE EVALUATION WAS PERFORMED  Avelina Mcclurkin E 07/08/2011, 6:43 AM

## 2011-07-09 MED ORDER — WARFARIN SODIUM 2 MG PO TABS
2.0000 mg | ORAL_TABLET | Freq: Once | ORAL | Status: AC
  Administered 2011-07-09: 2 mg via ORAL
  Filled 2011-07-09: qty 1

## 2011-07-09 NOTE — Progress Notes (Signed)
Patient ID: [name], female   DOB: 09-Feb-1917, 77 y.o.   MRN: [id number] Subjective/Complaints: Slept well  Review of Systems  Musculoskeletal: Positive for joint pain.  All other systems reviewed and are negative.   Objective: Vital Signs: Blood pressure 139/55, pulse 69, temperature 98.5 F (36.9 C), temperature source Oral, resp. rate 20, height 5\' 4"  (1.626 m), weight 58.7 kg (129 lb 6.6 oz), SpO2 98.00%. No results found. No results found for this basename: WBC:2,HGB:2,HCT:2,PLT:2 in the last 72 hours No results found for this basename: NA:2,K:2,CL:2,CO2:2,GLUCOSE:2,BUN:2,CREATININE:2,CALCIUM:2 in the last 72 hours CBG (last 3)  No results found for this basename: GLUCAP:3 in the last 72 hours  Wt Readings from Last 3 Encounters:  07/05/11 58.7 kg (129 lb 6.6 oz)  07/02/11 64.4 kg (141 lb 15.6 oz)  07/02/11 64.4 kg (141 lb 15.6 oz)    Physical Exam:  General appearance: alert and no distress Head: Normocephalic, without obvious abnormality, atraumatic Eyes: conjunctivae/corneas clear. PERRL, EOM's intact. Fundi benign. Ears: normal TM's and external ear canals both ears Nose: Nares normal. Septum midline. Mucosa normal. No drainage or sinus tenderness. Throat: lips, mucosa, and tongue normal; teeth and gums normal Neck: no adenopathy, no carotid bruit, no JVD, supple, symmetrical, trachea midline and thyroid not enlarged, symmetric, no tenderness/mass/nodules Back: symmetric, no curvature. ROM normal. No CVA tenderness. Resp: clear to auscultation bilaterally Cardio: regular rate and rhythm, S1, S2 normal, no murmur, click, rub or gallop GI: soft, non-tender; bowel sounds normal; no masses,  no organomegaly Extremities: extremities normal, atraumatic, no cyanosis or edema Pulses: 2+ and symmetric Skin: Skin color, texture, turgor normal. No rashes or lesions Neurologic: Grossly normal  Pt is oriented and cognitively appropriate this am.  Pain inhib weakness in left  leg Incision/Wound: wound c/d/i    Leg appropriately tender   Assessment/Plan: 1. Functional deficits secondary to left FNF with hip hemi which require 3+ hours per day of interdisciplinary therapy in a comprehensive inpatient rehab setting. Physiatrist is providing close team supervision and 24 hour management of active medical problems listed below. Physiatrist and rehab team continue to assess barriers to discharge/monitor patient progress toward functional and medical goals. Mobility: Bed Mobility Bed Mobility: Yes Supine to Sit: 3: Mod assist Sit to Supine - Left: 4: Min assist Transfers Transfers: Yes Sit to Stand: 3: Mod assist Stand to Sit: 3: Mod assist;4: Min assist Stand Pivot Transfers: 3: Mod assist Ambulation/Gait Ambulation/Gait Assistance: 4: Min assist Ambulation Distance (Feet): 10 Feet Assistive device: Rolling walker Gait Pattern: Step-to pattern;Antalgic Stairs: No Corporate treasurer: Yes Wheelchair Assistance: 4: Systems analyst: Both upper extremities Wheelchair Parts Management: Needs assistance Distance: 100 ADL:   Cognition: Cognition Overall Cognitive Status: Appears within functional limits for tasks assessed Arousal/Alertness: Awake/alert Orientation Level: Oriented X4 Cognition Arousal/Alertness: Awake/alert Orientation Level: Oriented X4   1. DVT Prophylaxis/Anticoagulation: Continue coumadin. Serial INR's. HGB stable 2. Pain Management: better with prn oxycodone prior to therapies. Family reports no worsening of cognition post narcotic doses.  3. Mood:Pleasant and appropriated. Motivated to get better.  4. Fracture, femur (07/02/2011) : Underwent left hip hemi on 12/09 and is WBAT  5. SSS: PPM in place. Continue cardizem   6. GOUT: Resume allopurinol to avoid flare up.   7. HYPOTHYROIDISM: Continue supplement.  8. HYPERTENSION: Monitor with bid checks. Continue cardizem 180 mg daily.  9.  Sundowning/ Insomnia: Low dose xanax at bedtime effective.Sleeping well    10. Anemia: Resumed iron supplement. hgb improved. No  active signs of bleeding. 11. Constipation chronic plus pain meds Miralax, add docusate      LOS (Days) 4 A FACE TO FACE EVALUATION WAS PERFORMED  Dannis Deroche E 07/09/2011, 6:45 AM

## 2011-07-09 NOTE — Progress Notes (Signed)
ANTICOAGULATION CONSULT NOTE - Follow Up Consult  Pharmacy Consult:  Coumadin Indication: VTE px s/p hip fx repair  Allergies  Allergen Reactions  . Aspirin   . Penicillins Rash    Patient Measurements: Height: 5\' 4"  (162.6 cm) Weight: 129 lb 6.6 oz (58.7 kg) IBW/kg (Calculated) : 54.7   Vital Signs: Temp: 98.5 F (36.9 C) (12/16 0628) Temp src: Oral (12/16 0628) BP: 139/55 mmHg (12/16 0628) Pulse Rate: 69  (12/16 0628)  Labs:  Basename 07/09/11 0645 07/08/11 0715 07/07/11 0627  HGB -- -- --  HCT -- -- --  PLT -- -- --  APTT -- -- --  LABPROT 23.4* 30.9* 28.5*  INR 2.04* 2.91* 2.63*  HEPARINUNFRC -- -- --  CREATININE -- -- --  CKTOTAL -- -- --  CKMB -- -- --  TROPONINI -- -- --   Estimated Creatinine Clearance: 38.7 ml/min (by C-G formula based on Cr of 0.67).   Assessment: 89 YOF on Coumadin for VTE px s/p hip fx repair.  INR therapeutic.  Goal of Therapy:  INR 2-3   Plan:  1.  Coumadin 2 mg PO today 2.  Daily PT/INR   Mickeal Skinner 07/09/2011,8:13 AM

## 2011-07-09 NOTE — Progress Notes (Signed)
Physical Therapy Session Note  Patient Details  Name: [name] MRN: [id number] Date of Birth: 1917/02/26  Today's Date: 07/09/2011 Time: 1100-1200 Time Calculation (min): 60 min  Precautions: Precautions Precautions: Posterior Hip Required Braces or Orthoses: Yes (KI) Restrictions Weight Bearing Restrictions: Yes LLE Weight Bearing: Weight bearing as tolerated  Short Term Goals: PT Short Term Goal 1: Pt will be able to complete bed mobilty with minA PT Short Term Goal 2: Pt will be able to demonstrate minA dynamic standing balance with functional tasks PT Short Term Goal 3: Pt will be able to gait x 100' with minA  Skilled Therapeutic Interventions/Progress Updates:  No c/o pain.  Pt participated in LE strengthening group to increase participation and motivation. Pt performed the following there-ex 2x15 each bil with cues for technique and NWB status was maintained throughout: Supine ex: AP, SAQ with 2#s, heel slides, hip ABD/ADD in decr ROM L hip, Sitting: LAQ, hip ADD ball squeeze, marching Standing in // bars: heel raises and mini squats Gait 1x55'' with RW Min A, cues for posture and gait pattern See FIM for functional transfers.      Therapy/Group: Group Therapy  Iona Coach, PT 07/09/2011, 12:22 PM

## 2011-07-09 NOTE — Progress Notes (Signed)
Patient alert and oriented x 3 - uses call bell appropriately this shift. Patient hard of hearing and has hearing aids. Patient continent of bowel and bladder with last bowel movement on 12/15 per tech report. Patient able to stand pivot with min-mod assist depending on level of fatigue. Patient WBAT to LLE. Patient staples to left hip approximated. Dressing clean dry and intact.  Patient taking medications whole with applesauce. Patient appetite good. Patient continues to any pain and already on scheduled pain medications. Continue with plan of care.

## 2011-07-10 LAB — PROTIME-INR
INR: 1.61 — ABNORMAL HIGH (ref 0.00–1.49)
Prothrombin Time: 19.4 seconds — ABNORMAL HIGH (ref 11.6–15.2)

## 2011-07-10 MED ORDER — WARFARIN SODIUM 2.5 MG PO TABS
2.5000 mg | ORAL_TABLET | Freq: Once | ORAL | Status: AC
Start: 2011-07-10 — End: 2011-07-10
  Administered 2011-07-10: 2.5 mg via ORAL
  Filled 2011-07-10: qty 1

## 2011-07-10 MED ORDER — TRAMADOL HCL 50 MG PO TABS
50.0000 mg | ORAL_TABLET | Freq: Every day | ORAL | Status: DC
  Administered 2011-07-10 – 2011-07-13 (×4): 50 mg via ORAL
  Filled 2011-07-10 (×5): qty 1

## 2011-07-10 NOTE — Progress Notes (Signed)
Occupational Therapy Session Note  Patient Details  Name: [name] MRN: [id number] Date of Birth: 1916/07/29  Today's Date: 07/10/2011 Time: 1300-1330 Time Calculation (min): 30 min  Precautions: Precautions Precautions: Posterior Hip Restrictions Weight Bearing Restrictions: Yes LLE Weight Bearing: Weight bearing as tolerated  Short Term Goals: OT Short Term Goal 1: Patient will complete LB dressing with minimal assist using AE prn OT Short Term Goal 2: Patient will complete toilet transfer using DME prn with minimal assist OT Short Term Goal 3: Patient will maintain dynamic standing balance with supervision during activities of daily living (LTG)  Skilled Therapeutic Interventions/Progress Updates:    Focus on tub transfers with tub bench and toilet transfers/toileting.  Pt amb with r/w to BR for tub abd toilet transfers.  Pt requires assist lifting LLE in and out of tub.  Pt follows hip precautions without verbal cues.  No unsafe behaviors noted.  Pt stated that caregiver would be able to assist with tub transfers and was assisting prior to admission.    Pain Pain Assessment Pain Assessment: No/denies pain   Therapy/Group: Individual Therapy  Rich Brave 07/10/2011, 1:41 PM

## 2011-07-10 NOTE — Progress Notes (Signed)
Occupational Therapy Session Note  Patient Details  Name: CARLEN REBUCK MRN: [id number] Date of Birth: 1917/03/18  Today's Date: 07/10/2011 Time: 1000-1100 Time Calculation (min): 60 min  Precautions: Precautions Precautions: Posterior Hip Restrictions Weight Bearing Restrictions: Yes LLE Weight Bearing: Weight bearing as tolerated  Short Term Goals: OT Short Term Goal 1: Patient will complete LB dressing with minimal assist using AE prn OT Short Term Goal 2: Patient will complete toilet transfer using DME prn with minimal assist OT Short Term Goal 3: Patient will maintain dynamic standing balance with supervision during activities of daily living (LTG)  Skilled Therapeutic Interventions/Progress Updates:    ADL retraining including bathing and dressing w/c level at sink.  Pt using reacher and sock aide to assist with dressing tasks.  Pt recalls 3/3 hip precautions and follows hip precautions with no verbal cues.  Pt steady assist with transfers.  Pt continues to exhibit SOB with activity but O2 sats>90% on room air.  Focus on activity tolerance, dynamic standing balance, and safety awareness.  Pt's caregiver present for therapy.  Discussed with caregiver hip precautions and assist level at discharge.  Pain Pain Assessment Pain Assessment: No/denies pain Pain Score: 0-No pain  Therapy/Group: Individual Therapy  Rich Brave 07/10/2011, 12:08 PM

## 2011-07-10 NOTE — Progress Notes (Signed)
Patient ID: [name], female   DOB: 06/16/1917, 77 y.o.   MRN: [id number] Patient ID: [name], female   DOB: 1917/02/12, 76 y.o.   MRN: [id number] Subjective/Complaints: Slept well.  Forgets to ask for pain meds before therapy because she doesn't hut until she starts to move.  Review of Systems  Musculoskeletal: Positive for joint pain.  All other systems reviewed and are negative.   Objective: Vital Signs: Blood pressure 150/67, pulse 70, temperature 98.3 F (36.8 C), temperature source Oral, resp. rate 18, height 5\' 4"  (1.626 m), weight 58.7 kg (129 lb 6.6 oz), SpO2 96.00%. No results found. No results found for this basename: WBC:2,HGB:2,HCT:2,PLT:2 in the last 72 hours No results found for this basename: NA:2,K:2,CL:2,CO2:2,GLUCOSE:2,BUN:2,CREATININE:2,CALCIUM:2 in the last 72 hours CBG (last 3)  No results found for this basename: GLUCAP:3 in the last 72 hours  Wt Readings from Last 3 Encounters:  07/05/11 58.7 kg (129 lb 6.6 oz)  07/02/11 64.4 kg (141 lb 15.6 oz)  07/02/11 64.4 kg (141 lb 15.6 oz)    Physical Exam:  General appearance: alert and no distress Head: Normocephalic, without obvious abnormality, atraumatic Eyes: conjunctivae/corneas clear. PERRL, EOM's intact. Fundi benign. Ears: normal TM's and external ear canals both ears Nose: Nares normal. Septum midline. Mucosa normal. No drainage or sinus tenderness. Throat: lips, mucosa, and tongue normal; teeth and gums normal Neck: no adenopathy, no carotid bruit, no JVD, supple, symmetrical, trachea midline and thyroid not enlarged, symmetric, no tenderness/mass/nodules Back: symmetric, no curvature. ROM normal. No CVA tenderness. Resp: clear to auscultation bilaterally Cardio: regular rate and rhythm, S1, S2 normal, no murmur, click, rub or gallop GI: soft, non-tender; bowel sounds normal; no masses,  no organomegaly Extremities: extremities normal, atraumatic, no cyanosis or edema Pulses: 2+ and  symmetric Skin: Skin color, texture, turgor normal. No rashes or lesions Neurologic: Grossly normal  Pt is oriented and cognitively appropriate this am.  Pain inhib weakness in left leg Incision/Wound: wound c/d/i    Leg appropriately tender.  There is a blood blister lower on thigh covered with dressing.   Assessment/Plan: 1. Functional deficits secondary to left FNF with hip hemi which require 3+ hours per day of interdisciplinary therapy in a comprehensive inpatient rehab setting. Physiatrist is providing close team supervision and 24 hour management of active medical problems listed below. Physiatrist and rehab team continue to assess barriers to discharge/monitor patient progress toward functional and medical goals. Mobility: Bed Mobility Bed Mobility: Yes Supine to Sit: 3: Mod assist Sit to Supine - Left: 4: Min assist Transfers Transfers: Yes Sit to Stand: 3: Mod assist Stand to Sit: 3: Mod assist;4: Min assist Stand Pivot Transfers: 3: Mod assist Ambulation/Gait Ambulation/Gait Assistance: 4: Min assist Ambulation Distance (Feet): 10 Feet Assistive device: Rolling walker Gait Pattern: Step-to pattern;Antalgic Stairs: No Corporate treasurer: Yes Wheelchair Assistance: 4: Systems analyst: Both upper extremities Wheelchair Parts Management: Needs assistance Distance: 100 ADL:   Cognition: Cognition Overall Cognitive Status: Appears within functional limits for tasks assessed Arousal/Alertness: Awake/alert Orientation Level: Oriented X4 Cognition Arousal/Alertness: Awake/alert Orientation Level: Oriented X4   1. DVT Prophylaxis/Anticoagulation: Continue coumadin. Serial INR's. HGB stable 2. Pain Management: better with prn oxycodone prior to therapies. Family reports no worsening of cognition post narcotic doses. Will schedule tramadol each am for now.  3. Mood:Pleasant and appropriated. Motivated to get better.  4. Fracture,  femur (07/02/2011) : Underwent left hip hemi on 12/09 and is WBAT  5. SSS: PPM in  place. Continue cardizem   6. GOUT: Resume allopurinol to avoid flare up.   7. HYPOTHYROIDISM: Continue supplement.  8. HYPERTENSION: Monitor with bid checks. Continue cardizem 180 mg daily.  9. Sundowning/ Insomnia: Low dose xanax at bedtime effective.Sleeping well. Still no p[roblems      10. Anemia: Resumed iron supplement. hgb improved. No active signs of bleeding. 11. Constipation chronic plus pain meds Miralax, add docusate  12. Wounds: remove staples before d/c.  Continue tegaderm to thigh.      LOS (Days) 5 A FACE TO FACE EVALUATION WAS PERFORMED  Marlei Glomski T 07/10/2011, 7:53 AM

## 2011-07-10 NOTE — Progress Notes (Signed)
Physical Therapy Note  Patient Details  Name: [name] MRN: [id number] Date of Birth: Dec 07, 1916 Today's Date: 07/10/2011  11:00- 12:00:  Individual therapy, pt. Denies pain this session.  Family education with caregiver and grandson for hip precautions, car transfer, gait training, and bed mobility. Overall pt. Mod assist for car and bed transfer and min assist for gait with RW and vc for walker safety with backing and turning and left foot position to decrease hip IR with walking.   Julian Reil 07/10/2011, 12:03 PM

## 2011-07-10 NOTE — Progress Notes (Signed)
ANTICOAGULATION CONSULT NOTE - Follow Up Consult  Pharmacy Consult:  Coumadin Indication: VTE px s/p hip fx repair  Allergies  Allergen Reactions  . Aspirin   . Penicillins Rash    Patient Measurements: Height: 5\' 4"  (162.6 cm) Weight: 129 lb 6.6 oz (58.7 kg) IBW/kg (Calculated) : 54.7   Vital Signs: Temp: 98.3 F (36.8 C) (12/17 0500) Temp src: Oral (12/17 0500) BP: 150/67 mmHg (12/17 0500) Pulse Rate: 70  (12/17 0500)  Labs:  Basename 07/10/11 0500 07/09/11 0645 07/08/11 0715  HGB -- -- --  HCT -- -- --  PLT -- -- --  APTT -- -- --  LABPROT 19.4* 23.4* 30.9*  INR 1.61* 2.04* 2.91*  HEPARINUNFRC -- -- --  CREATININE -- -- --  CKTOTAL -- -- --  CKMB -- -- --  TROPONINI -- -- --   Estimated Creatinine Clearance: 38.7 ml/min (by C-G formula based on Cr of 0.67).   Assessment: 89 YOF on Coumadin for VTE px s/p hip fx repair. INR sub-therapeutic, likely d/t conservative to counter initial increased sensitivity to Coumadin.  Goal of Therapy:  INR 2-3   Plan:  1.  Coumadin 2.5 mg PO today 2.  Daily PT/INR   Shir Bergman K. Allena Katz, PharmD, BCPS.  Clinical Pharmacist Pager [phone]. 07/10/2011 12:46 PM

## 2011-07-10 NOTE — Progress Notes (Signed)
Physical Therapy Note  Patient Details  Name: [name] MRN: [id number] Date of Birth: [birth year] Today's Date: 07/10/2011  14:00- 14:45:  Individual therapy, pt denies pain.  Gait training 120', 150' with tactile cues to relax shoulders and 1 standing rest break. Lt. LE heel slides, hip abduction, SAQ, sit ups, LAQ 20 each. Sit ups performed to aid bed mobility and pt. Progressed to min assist. x3   Julian Reil 07/10/2011, 4:42 PM

## 2011-07-10 NOTE — Progress Notes (Signed)
Pt alert and oriented X 3. Able to voice concerns. Pt denies pain most of shift 2/10. Continent of Bowel and Bladder last BM 07/10/11. Pt hard of hearing. Hearing aids in place. Pt able to stand and pivot with min assist. Medication taken whole in applesauce eaten 90% of meals today. Dressing changed to (L) hip skin clean and dry staple in place no s/s of infection. Continue with plan of care.

## 2011-07-11 ENCOUNTER — Encounter: Payer: PRIVATE HEALTH INSURANCE | Admitting: Internal Medicine

## 2011-07-11 DIAGNOSIS — I10 Essential (primary) hypertension: Secondary | ICD-10-CM

## 2011-07-11 DIAGNOSIS — S72009A Fracture of unspecified part of neck of unspecified femur, initial encounter for closed fracture: Secondary | ICD-10-CM

## 2011-07-11 DIAGNOSIS — D62 Acute posthemorrhagic anemia: Secondary | ICD-10-CM

## 2011-07-11 DIAGNOSIS — W19XXXA Unspecified fall, initial encounter: Secondary | ICD-10-CM

## 2011-07-11 DIAGNOSIS — Z5189 Encounter for other specified aftercare: Secondary | ICD-10-CM

## 2011-07-11 DIAGNOSIS — M109 Gout, unspecified: Secondary | ICD-10-CM

## 2011-07-11 LAB — PROTIME-INR
INR: 1.79 — ABNORMAL HIGH (ref 0.00–1.49)
Prothrombin Time: 21.1 seconds — ABNORMAL HIGH (ref 11.6–15.2)

## 2011-07-11 MED ORDER — WARFARIN SODIUM 2.5 MG PO TABS
2.5000 mg | ORAL_TABLET | Freq: Every day | ORAL | Status: AC
  Administered 2011-07-11: 2.5 mg via ORAL
  Filled 2011-07-11: qty 1

## 2011-07-11 NOTE — Progress Notes (Signed)
Physical Therapy Note  Patient Details  Name: [name] MRN: [id number] Date of Birth: November 05, 1916 Today's Date: 07/11/2011  14:15- 15:00 individual therapy pt denies pain.  Sit to supine supervision onto mat with demonstration for technique. Left leg there. Ex. Heel slides, hip abduc., SAQ, LAQ , sit ups with rotation to aid bed mobility 20 each in supine and hip abduction and flexion in standing 20 each left only.  Pt. Able to perform supine to sit with min assist after exercises.  Gait training controlled environment 150' supervision with RW and VC for turning lt. Toe out.     Julian Reil 07/11/2011, 2:31 PM

## 2011-07-11 NOTE — Progress Notes (Signed)
ANTICOAGULATION CONSULT NOTE - Follow Up Consult  Pharmacy Consult:  Coumadin Indication: VTE px s/p hip fx repair  Allergies  Allergen Reactions  . Aspirin   . Penicillins Rash    Patient Measurements: Height: 5\' 4"  (162.6 cm) Weight: 129 lb 6.6 oz (58.7 kg) IBW/kg (Calculated) : 54.7   Vital Signs: Temp: 98.4 F (36.9 C) (12/18 0517) Temp src: Oral (12/18 0517) BP: 114/68 mmHg (12/18 0517) Pulse Rate: 62  (12/18 0517)  Labs:  Basename 07/11/11 0620 07/10/11 0500 07/09/11 0645  HGB -- -- --  HCT -- -- --  PLT -- -- --  APTT -- -- --  LABPROT 21.1* 19.4* 23.4*  INR 1.79* 1.61* 2.04*  HEPARINUNFRC -- -- --  CREATININE -- -- --  CKTOTAL -- -- --  CKMB -- -- --  TROPONINI -- -- --   Estimated Creatinine Clearance: 38.7 ml/min (by C-G formula based on Cr of 0.67).   Assessment: 89 YOF on Coumadin for VTE px s/p hip fx repair. INR sub-therapeutic, but trending up slowly. Likely d/t conservative to counter initial increased sensitivity to Coumadin.  Goal of Therapy:  INR 2-3   Plan:  1.  Coumadin 2.5 mg q 1800 2.  Daily PT/INR   Windell Musson K. Allena Katz, PharmD, BCPS.  Clinical Pharmacist Pager [phone]. 07/11/2011 11:08 AM

## 2011-07-11 NOTE — Progress Notes (Signed)
Physical Therapy Note  Patient Details  Name: [name] MRN: [id number] Date of Birth: Oct 22, 1916 Today's Date: 07/11/2011  11:00- 12:00:  Individual therapy,  Pt states lt. Leg is sore and that she was premedicated.  Gait training in kitchen environment supervision with RW including getting items out Of the refrigerator using reacher.  Gait training controlled area 120' with RW supervision. Sit to stand from low kitchen chair mod assist.   Julian Reil 07/11/2011, 12:09 PM

## 2011-07-11 NOTE — Progress Notes (Signed)
Pt alert and oriented X 3  on room air. Pt min assist. Continent of bowel and bladder Last BM 07/11/11. Medication whole in applesauce. Pain 3-4/10 with pt requesting PRN medication c/o left leg with blister that has restore dressing intact. Staples to left hip clean/dry and intact. Pt hard of hearing with (B) hearing aids in place. Continue with plan of care.

## 2011-07-11 NOTE — Progress Notes (Signed)
Occupational Therapy Session Note  Patient Details  Name: [name] MRN: [id number] Date of Birth: 02/24/1917  Today's Date: 07/11/2011 Time: 0900-0955 Time Calculation (min): 55 min  Precautions: Precautions Precautions: Posterior Hip Restrictions Weight Bearing Restrictions: Yes LLE Weight Bearing: Weight bearing as tolerated  Short Term Goals: OT Short Term Goal 1: Patient will complete LB dressing with minimal assist using AE prn OT Short Term Goal 2: Patient will complete toilet transfer using DME prn with minimal assist OT Short Term Goal 3: Patient will maintain dynamic standing balance with supervision during activities of daily living (LTG)  Skilled Therapeutic Interventions/Progress Updates:    ADL retraining including bathing and dressing w/c level at sink.  Pt declined shower stating that she would wait a couple of weeks before she would shower.  Pt completed activities with sit to stand at sink to bathe buttocks and pull up pants.  Pt uses reacher for LB dressing.  Pt unable to don shoes secondary to swelling in Left foot.  Pt exhibits SOB but O2 sats > 90% on RA throughout session.  Pt follows hip precautions without verbal cues.  Focus on activity tolerance, safety awareness, and dynamic standing balance.    Pain Pain Assessment Pain Assessment: 0-10 Pain Score:   4 Pain Type: Acute pain;Surgical pain Pain Location: Hip Pain Orientation: Left Pain Descriptors: Aching Pain Onset: On-going Patients Stated Pain Goal: 1 Pain Intervention(s): RN made aware   Therapy/Group: Individual Therapy  Rich Brave 07/11/2011, 10:59 AM

## 2011-07-11 NOTE — Progress Notes (Signed)
Occupational Therapy Session Note  Patient Details  Name: [name] MRN: [id number] Date of Birth: 13-Jun-1917  Today's Date: 07/11/2011 Time: 1300-1330 Time Calculation (min): 30 min  Precautions: Precautions Precautions: Posterior Hip  Restrictions Weight Bearing Restrictions: Yes LLE Weight Bearing: Weight bearing as tolerated  Short Term Goals: OT Short Term Goal 1: Patient will complete LB dressing with minimal assist using AE prn OT Short Term Goal 2: Patient will complete toilet transfer using DME prn with minimal assist OT Short Term Goal 3: Patient will maintain dynamic standing balance with supervision during activities of daily living (LTG)  Skilled Therapeutic Interventions/Progress Updates:    Pt engaged in kitchen activities while standing a counter.  Pt amb to kitchen from adjacent room with r/w to complete tasks.  Pt stood at counter for 20 minutes to complete tasks.  Focus on activity tolerance and dynamic standing balance.    Pain Pain Assessment Pain Assessment: No/denies pain Pain Score: 0-No pain   Therapy/Group: Individual Therapy  Rich Brave 07/11/2011, 3:03 PM

## 2011-07-11 NOTE — Progress Notes (Signed)
Patient ID: [name], female   DOB: February 19, 1917, 77 y.o.   MRN: [id number] Patient ID: [name], female   DOB: [birth year], 77 y.o.   MRN: [id number] Patient ID: [name], female   DOB: 10-05-1916, 77 y.o.   MRN: [id number] Subjective/Complaints: Slept well.  Feels like she's getting stronger each day.  Review of Systems  Musculoskeletal: Positive for joint pain.  All other systems reviewed and are negative.   Objective: Vital Signs: Blood pressure 114/68, pulse 62, temperature 98.4 F (36.9 C), temperature source Oral, resp. rate 20, height 5\' 4"  (1.626 m), weight 58.7 kg (129 lb 6.6 oz), SpO2 93.00%. No results found. No results found for this basename: WBC:2,HGB:2,HCT:2,PLT:2 in the last 72 hours No results found for this basename: NA:2,K:2,CL:2,CO2:2,GLUCOSE:2,BUN:2,CREATININE:2,CALCIUM:2 in the last 72 hours CBG (last 3)  No results found for this basename: GLUCAP:3 in the last 72 hours  Wt Readings from Last 3 Encounters:  07/05/11 58.7 kg (129 lb 6.6 oz)  07/02/11 64.4 kg (141 lb 15.6 oz)  07/02/11 64.4 kg (141 lb 15.6 oz)    Physical Exam:  General appearance: alert and no distress Head: Normocephalic, without obvious abnormality, atraumatic Eyes: conjunctivae/corneas clear. PERRL, EOM's intact. Fundi benign. Ears: normal TM's and external ear canals both ears Nose: Nares normal. Septum midline. Mucosa normal. No drainage or sinus tenderness. Throat: lips, mucosa, and tongue normal; teeth and gums normal Neck: no adenopathy, no carotid bruit, no JVD, supple, symmetrical, trachea midline and thyroid not enlarged, symmetric, no tenderness/mass/nodules Back: symmetric, no curvature. ROM normal. No CVA tenderness. Resp: clear to auscultation bilaterally Cardio: regular rate and rhythm, S1, S2 normal, no murmur, click, rub or gallop GI: soft, non-tender; bowel sounds normal; no masses,  no organomegaly Extremities: extremities normal, atraumatic, no cyanosis  or edema Pulses: 2+ and symmetric Skin: Skin color, texture, turgor normal. No rashes or lesions Neurologic: Grossly normal  Pt is oriented and cognitively appropriate this am.  Pain inhib weakness in left leg Incision/Wound: wound c/d/i with staples   Leg appropriately tender.  There is a blood blister lower on thigh covered with dressing.   Assessment/Plan: 1. Functional deficits secondary to left FNF with hip hemi which require 3+ hours per day of interdisciplinary therapy in a comprehensive inpatient rehab setting. Physiatrist is providing close team supervision and 24 hour management of active medical problems listed below. Physiatrist and rehab team continue to assess barriers to discharge/monitor patient progress toward functional and medical goals. Mobility: Bed Mobility Bed Mobility: Yes Supine to Sit: 3: Mod assist Sit to Supine - Left: 4: Min assist Transfers Transfers: Yes Sit to Stand: 3: Mod assist Stand to Sit: 3: Mod assist;4: Min assist Stand Pivot Transfers: 3: Mod assist Ambulation/Gait Ambulation/Gait Assistance: 4: Min assist Ambulation Distance (Feet): 10 Feet Assistive device: Rolling walker Gait Pattern: Step-to pattern;Antalgic Stairs: No Corporate treasurer: Yes Wheelchair Assistance: 4: Systems analyst: Both upper extremities Wheelchair Parts Management: Needs assistance Distance: 100 ADL:   Cognition: Cognition Overall Cognitive Status: Appears within functional limits for tasks assessed Arousal/Alertness: Awake/alert Orientation Level: Oriented X4 Cognition Arousal/Alertness: Awake/alert Orientation Level: Oriented X4   1. DVT Prophylaxis/Anticoagulation: Continue coumadin. Serial INR's. HGB stable  2. Pain Management: better with prn oxycodone prior to therapies. Family reports no worsening of cognition post narcotic doses. Will schedule tramadol each am for now.   3. Mood:Pleasant and appropriated.  Motivated to get better.   4. Fracture, femur (07/02/2011) : Underwent left hip hemi  on 12/09 and is WBAT  5. SSS: PPM in place. Continue cardizem   6. GOUT:  allopurinol to avoid flare up.   7. HYPOTHYROIDISM: Continue supplement.   8. HYPERTENSION: Monitor with bid checks. Continue cardizem 180 mg daily.   9. Sundowning/ Insomnia: Low dose xanax at bedtime effective.Sleeping well. Doing surprisingly well with this.     10. Anemia: Resumed iron supplement. hgb improved. No active signs of bleeding.  11. Constipation chronic plus pain meds Miralax, add docusate  12. Wounds: remove staples before d/c.  Continue tegaderm to thigh.      LOS (Days) 6 A FACE TO FACE EVALUATION WAS PERFORMED  SWARTZ,ZACHARY T 07/11/2011, 7:10 AM

## 2011-07-11 NOTE — Progress Notes (Signed)
Subjective:     Patient reports pain as 3 on 0-10 scale.    Objective: Vital signs in last 24 hours: Temp:  [98.4 F (36.9 C)] 98.4 F (36.9 C) (12/18 0517) Pulse Rate:  [62] 62  (12/18 0517) Resp:  [20] 20  (12/18 0517) BP: (114)/(68) 114/68 mmHg (12/18 0517) SpO2:  [93 %] 93 % (12/18 0517)  Intake/Output from previous day: 12/17 0701 - 12/18 0700 In: 600 [P.O.:600] Out: -  Intake/Output this shift:    No results found for this basename: HGB:5 in the last 72 hours No results found for this basename: WBC:2,RBC:2,HCT:2,PLT:2 in the last 72 hours No results found for this basename: NA:2,K:2,CL:2,CO2:2,BUN:2,CREATININE:2,GLUCOSE:2,CALCIUM:2 in the last 72 hours  Basename 07/11/11 0620 07/10/11 0500  LABPT -- --  INR 1.79* 1.61*    Incision: no drainage Left hip dressing benign. N-V intact to LLE  Assessment/Plan:    S/P Hemiarthroplasty left hip   POD#8 Up with therapy WBAT Will need staples out when 2 weeks post op  Follow up with Dr Luiz Blare in 2 weeks. Kayler Rise G 07/11/2011, 9:38 AM

## 2011-07-11 NOTE — Patient Care Conference (Signed)
Inpatient RehabilitationTeam Conference Note Date: 07/11/2011   Time: 6:40 PM    Patient Name: [name]      Medical Record Number: [id number]  Date of Birth: 1916-10-27 Sex: Female         Room/Bed: 4003/4003-01 Payor Info: Payor: MEDICARE  Plan: MEDICARE PART A AND B  Product Type: *No Product type*     Admitting Diagnosis: LT HIP HEMI  Admit Date/Time:  07/05/2011  3:47 PM Admission Comments: No comment available   Primary Diagnosis:  Fracture, femur Principal Problem: Fracture, femur  Patient Active Problem List  Diagnoses Date Noted  . Acute delirium 07/05/2011  . Fracture, femur 07/02/2011  . Chest pain, unspecified   . Small bowel obstruction   . Pacemaker   . Depression   . AV block   . Recurrent UTI   . SSS (sick sinus syndrome)   . GERD (gastroesophageal reflux disease)   . DVT (deep venous thrombosis)   . GOUT 07/06/2010  . HYPOTHYROIDISM 07/06/2010  . HYPOKALEMIA 07/06/2010  . ANEMIA 07/06/2010  . HYPERTENSION 07/06/2010    Expected Discharge Date: Expected Discharge Date: 07/13/11  Team Members Present: Physician: Dr. Faith Rogue Case Manager Present: Melanee Spry, RN Social Worker Present: Amada Jupiter, LCSW PT Present: Karolee Stamps, Judith Blonder, PTA OT Present: Edwin Cap, OT Other (Discipline and Name): Tora Duck, PPS Coordinator RN Present: Daryll Brod    Current Status/Progress Goal Weekly Team Focus  Medical   hip pain improving.  sleeping well at night without hallucinations  improved activity tolerance and safety  see above   Bowel/Bladder   Continent of bowel and bladder. Wears depends.   No incontient episode  Monitor    Swallow/Nutrition/ Hydration             ADL's   min A LB bathing, min A LB dresing, min A toilet and tub transfers  min A LB bathing, supervision LB dressing, supervision toilet transfers  activity tolerance, transfers, family ed   Mobility   min assist bed mobility, supervion gait  minassist bed  mobility supervision gait  strength and activity tolerance   Communication             Safety/Cognition/ Behavioral Observations            Pain   No c/o pain  <3   Monitor    Skin   L hip incisiion with staples approximated, healing appropriately.  Tegaderm to blister (L posterior thigh)  No additional  skin breakdown  Assess skin q shift      *See Interdisciplinary Assessment and Plan and progress notes for long and short-term goals  Barriers to Discharge: safety judgement    Possible Resolutions to Barriers:  supervision at home    Discharge Planning/Teaching Needs:  Home with private duty caregiver days and intermittent from grandson in evenings      Team Discussion: Private duty caregiver & grandson have completed family ed.  Will arrange HHRN to f/up skin care & blood draws for coumadin.   Revisions to Treatment Plan: none    Continued Need for Acute Rehabilitation Level of Care: The patient requires daily medical management by a physician with specialized training in physical medicine and rehabilitation for the following conditions: Daily direction of a multidisciplinary physical rehabilitation program to ensure safe treatment while eliciting the highest outcome that is of practical value to the patient.: Yes Daily medical management of patient stability for increased activity during participation in an intensive rehabilitation regime.:  Yes Daily analysis of laboratory values and/or radiology reports with any subsequent need for medication adjustment of medical intervention for : Post surgical problems;Other  Pt & daughter in law [place], by phone ]were given team report & both are agreeable w/ d/c 07/13/11.  Daughter in law said "everything is ready for her at home".   [name] 07/11/2011, 6:40 PM

## 2011-07-12 LAB — CBC
Hemoglobin: 10.1 g/dL — ABNORMAL LOW (ref 12.0–15.0)
MCH: 29 pg (ref 26.0–34.0)
MCHC: 33.1 g/dL (ref 30.0–36.0)
Platelets: 395 10*3/uL (ref 150–400)
RDW: 14.2 % (ref 11.5–15.5)

## 2011-07-12 LAB — PROTIME-INR
INR: 1.78 — ABNORMAL HIGH (ref 0.00–1.49)
Prothrombin Time: 21 seconds — ABNORMAL HIGH (ref 11.6–15.2)

## 2011-07-12 MED ORDER — WARFARIN SODIUM 3 MG PO TABS
3.0000 mg | ORAL_TABLET | Freq: Every day | ORAL | Status: AC
  Administered 2011-07-12: 3 mg via ORAL
  Filled 2011-07-12: qty 1

## 2011-07-12 MED ORDER — WARFARIN SODIUM 2 MG PO TABS: 3.0000 mg | ORAL_TABLET | Freq: Every day | ORAL | Status: DC

## 2011-07-12 MED ORDER — HYDROCORTISONE ACETATE 25 MG RE SUPP: 25.0000 mg | Freq: Two times a day (BID) | RECTAL | Status: AC

## 2011-07-12 MED ORDER — HYDROCODONE-ACETAMINOPHEN 5-325 MG PO TABS: 1.0000 | ORAL_TABLET | Freq: Two times a day (BID) | ORAL | Status: AC | PRN

## 2011-07-12 MED ORDER — SENNOSIDES-DOCUSATE SODIUM 8.6-50 MG PO TABS: 2.0000 | ORAL_TABLET | Freq: Two times a day (BID) | ORAL | Status: AC

## 2011-07-12 MED ORDER — HYDROCORTISONE ACETATE 25 MG RE SUPP
25.0000 mg | Freq: Two times a day (BID) | RECTAL | Status: DC
  Administered 2011-07-12 – 2011-07-13 (×3): 25 mg via RECTAL
  Filled 2011-07-12 (×5): qty 1

## 2011-07-12 NOTE — Progress Notes (Signed)
Physical Therapy Discharge Summary  Patient Details  Name: [name] MRN: [id number] Date of Birth: [birth year] Today's Date: 07/12/2011  #1 8:40- 9:05 supine left leg exercises heel slides, hip abduction, SAQ 20 each. Gait controlled environment 150' with RW supervision with VC for left foot ER.  #2 10:00- 10:45  Car transfer min assist with help for left leg and VC for technique to avoid hip IR., home environment gait supervision with RW 50', 12 steps with 2 rails supervision with VC for sequence.  Sit to supine min assist for left leg.  #3 14:00- 14:30 group therapy , pt denies pain. Session focused on gait and sit to stands for musical chair game. Pt had episodes of unsafe sit to stands with rushing for game.  #4 15:15- 15:45 group therapy pt denies pain. Session focused on gait and dynamic balance with RW for relay race.  Patient has met 6 of 6 long term goals due to improved activity tolerance.  Patient to discharge at an ambulatory level Supervision.   Patient's care partner is independent to provide the necessary physical assistance at discharge.  Recommendation:  Patient will benefit from ongoing skilled PT services in home health setting to continue to advance safe functional mobility, address ongoing impairments in gait, balance, strength, activity tolerance, and minimize fall risk.  Equipment: No equipment provided  Patient/family agrees with progress made and goals achieved: Yes  Julian Reil 07/12/2011, 10:28 AM

## 2011-07-12 NOTE — Progress Notes (Signed)
ANTICOAGULATION CONSULT NOTE - Follow Up Consult  Pharmacy Consult:  Coumadin Indication: VTE px s/p hip fx repair  Allergies  Allergen Reactions  . Aspirin   . Penicillins Rash    Patient Measurements: Height: 5\' 4"  (162.6 cm) Weight: 129 lb 6.6 oz (58.7 kg) IBW/kg (Calculated) : 54.7   Vital Signs: Temp: 98 F (36.7 C) (12/19 0524) Temp src: Oral (12/19 0524) BP: 149/73 mmHg (12/19 0524) Pulse Rate: 82  (12/19 0524)  Labs:  Basename 07/12/11 0700 07/11/11 0620 07/10/11 0500  HGB -- -- --  HCT -- -- --  PLT -- -- --  APTT -- -- --  LABPROT 21.0* 21.1* 19.4*  INR 1.78* 1.79* 1.61*  HEPARINUNFRC -- -- --  CREATININE -- -- --  CKTOTAL -- -- --  CKMB -- -- --  TROPONINI -- -- --   Estimated Creatinine Clearance: 38.7 ml/min (by C-G formula based on Cr of 0.67).   Assessment: 89 YOF on Coumadin for VTE px s/p hip fx repair. INR sub-therapeutic today. Will attempt to slowly taper up Coumadin dose- cautious dosing d/t prior increased sensitivity.  Goal of Therapy:  INR 2-3   Plan:  1.  Increase Coumadin to 3 mg q 1800 2.  Daily PT/INR   [name], PharmD, BCPS.  Clinical Pharmacist Pager [phone]. 07/12/2011 8:47 AM

## 2011-07-12 NOTE — Progress Notes (Signed)
Pt to be D/C 07/13/11. Pt oriented X 3. Min assist to supervision in care. Pt able to stand/pivot. Continent of bowel and bladder new order for hemmoroids. Last BM 07/12/11. Medication in applesauce. Denies pain currently getting scheduled pain medication.Dressing to left changed. No s/s of infection to left hip.

## 2011-07-12 NOTE — Progress Notes (Signed)
Patient ID: [name], female   DOB: March 18, 1917, 77 y.o.   MRN: [id number] Patient ID: [name], female   DOB: Jul 26, 1916, 77 y.o.   MRN: [id number] Patient ID: [name], female   DOB: 12-23-1916, 77 y.o.   MRN: [id number] Patient ID: [name], female   DOB: 23-May-1917, 77 y.o.   MRN: [id number] Subjective/Complaints: Slept well.  Feels like she's getting stronger each day.  Review of Systems  Musculoskeletal: Positive for joint pain.  All other systems reviewed and are negative.   Objective: Vital Signs: Blood pressure 149/73, pulse 82, temperature 98 F (36.7 C), temperature source Oral, resp. rate 19, height 5\' 4"  (1.626 m), weight 58.7 kg (129 lb 6.6 oz), SpO2 97.00%. No results found. No results found for this basename: WBC:2,HGB:2,HCT:2,PLT:2 in the last 72 hours No results found for this basename: NA:2,K:2,CL:2,CO2:2,GLUCOSE:2,BUN:2,CREATININE:2,CALCIUM:2 in the last 72 hours CBG (last 3)  No results found for this basename: GLUCAP:3 in the last 72 hours  Wt Readings from Last 3 Encounters:  07/05/11 58.7 kg (129 lb 6.6 oz)  07/02/11 64.4 kg (141 lb 15.6 oz)  07/02/11 64.4 kg (141 lb 15.6 oz)    Physical Exam:  General appearance: alert and no distress Head: Normocephalic, without obvious abnormality, atraumatic Eyes: conjunctivae/corneas clear. PERRL, EOM's intact. Fundi benign. Ears: normal TM's and external ear canals both ears Nose: Nares normal. Septum midline. Mucosa normal. No drainage or sinus tenderness. Throat: lips, mucosa, and tongue normal; teeth and gums normal Neck: no adenopathy, no carotid bruit, no JVD, supple, symmetrical, trachea midline and thyroid not enlarged, symmetric, no tenderness/mass/nodules Back: symmetric, no curvature. ROM normal. No CVA tenderness. Resp: clear to auscultation bilaterally Cardio: regular rate and rhythm, S1, S2 normal, no murmur, click, rub or gallop GI: soft, non-tender; bowel sounds normal; no  masses,  no organomegaly Extremities: extremities normal, atraumatic, no cyanosis or edema Pulses: 2+ and symmetric Skin: Skin color, texture, turgor normal. No rashes or lesions Neurologic: Grossly normal  Pt is oriented and cognitively appropriate this am.  Pain inhib weakness in left leg Incision/Wound: wound c/d/i with staples   Leg appropriately tender.  There is a blood blister lower on thigh stable.   Assessment/Plan: 1. Functional deficits secondary to left FNF with hip hemi which require 3+ hours per day of interdisciplinary therapy in a comprehensive inpatient rehab setting. Physiatrist is providing close team supervision and 24 hour management of active medical problems listed below. Physiatrist and rehab team continue to assess barriers to discharge/monitor patient progress toward functional and medical goals.  D/c 12/20  Mobility: Bed Mobility Bed Mobility: Yes Supine to Sit: 3: Mod assist Sit to Supine - Left: 4: Min assist Transfers Transfers: Yes Sit to Stand: 3: Mod assist Stand to Sit: 3: Mod assist;4: Min assist Stand Pivot Transfers: 3: Mod assist Ambulation/Gait Ambulation/Gait Assistance: 5: Supervision Ambulation Distance (Feet): 10 Feet Assistive device: Rolling walker Gait Pattern: Step-to pattern;Antalgic Stairs: No Corporate treasurer: Yes Wheelchair Assistance: 4: Systems analyst: Both upper extremities Wheelchair Parts Management: Needs assistance Distance: 100 ADL:   Cognition: Cognition Overall Cognitive Status: Appears within functional limits for tasks assessed Arousal/Alertness: Awake/alert Orientation Level: Oriented X4 Cognition Arousal/Alertness: Awake/alert Orientation Level: Oriented X4   1. DVT Prophylaxis/Anticoagulation: Continue coumadin. Serial INR's. HGB stable  2. Pain Management: better with prn oxycodone prior to therapies. Family reports no worsening of cognition post narcotic  doses. Scheduled tramadol helpful.   3. Mood:Pleasant and appropriated. Motivated to  get better.   4. Fracture, femur (07/02/2011) : Underwent left hip hemi on 12/09 and is WBAT  5. SSS: PPM in place. Continue cardizem   6. GOUT:  allopurinol to avoid flare up.   7. HYPOTHYROIDISM: Continue supplement.   8. HYPERTENSION: Monitor with bid checks. Continue cardizem 180 mg daily.   9. Sundowning/ Insomnia: Low dose xanax at bedtime effective.Sleeping well. Doing surprisingly well with this.     10. Anemia: Resumed iron supplement. hgb improved. No active signs of bleeding.  11. Constipation chronic plus pain meds Miralax, add docusate  12. Wounds: remove staples before d/c.  Continue tegaderm to thigh.      LOS (Days) 7 A FACE TO FACE EVALUATION WAS PERFORMED  [name] T 07/12/2011, 7:38 AM

## 2011-07-12 NOTE — Progress Notes (Signed)
Occupational Therapy Discharge Summary  Patient Details  Name: [name] MRN: [id number] Date of Birth: [birth year] Today's Date: 07/12/2011  Therapy Session Note 0900-1000 Pt denies pain ADL retraining - pt declined shower. Focus on bathing and dressing with sit to stand from w/c at sink.  Pt continues to require assistance to bathe bilateral lower legs without use of AE.  Pt states paid caregiver washes her feet at home.  Pt amb with r/w to BR for toilet transfer and toileting.  Pt performed tub bench transfer with stand pivot transfer with r/w.  Pt continues to require assistance lifting left leg over edge of tub.  Discharge Summary Pt demonstrated steady progress this admission.  Pt is supervision for UB and LB dressing with use of AE, min A for bathing without AE, supervision for toilet transfers and toileting, and simple kitchen tasks while standing.  Pt is min A for tub bench transfers.  Pt continues to require rest breaks during tasks.  Pt's paid caregiver has been present for therapy and verbalized and demonstrated appropriate assistance.  Patient has met 8 of 8 long term goals due to improved activity tolerance, improved balance, postural control and improved coordination.  Patient's caregiver is independent to provide the necessary physical and supervision assistance at discharge.    Recommendation:  Patient will continue to received OT services in a home health setting to continue to advance functional skills in the area of ADLs.  Equipment: No equipment provided Pt owns tube bench and BSC  Patient/family agrees with progress made and goals achieved: Yes  Rich Brave 07/12/2011, 10:00 AM

## 2011-07-13 MED ORDER — WARFARIN SODIUM 3 MG PO TABS
3.0000 mg | ORAL_TABLET | Freq: Every day | ORAL | Status: DC
Start: 2011-07-13 — End: 2011-07-13
  Filled 2011-07-13: qty 1

## 2011-07-13 MED ORDER — DOCUSATE SODIUM 283 MG RE ENEM
1.0000 | ENEMA | Freq: Once | RECTAL | Status: AC
  Administered 2011-07-13: 283 mg via RECTAL
  Filled 2011-07-13: qty 1

## 2011-07-13 NOTE — Progress Notes (Signed)
Pt having a hard time with having a BM. Digital removal of bowel content of hard round stool. Pt had moderate amount of stool content. Pt also given warm prune juice to aid with bowel emptying.

## 2011-07-13 NOTE — Progress Notes (Signed)
Patient ID: ALDINA PORTA, female   DOB: May 16, 1917, 77 y.o.   MRN: [id number] Patient ID: AIMEE HELDMAN, female   DOB: [birth year], 77 y.o.   MRN: [id number] Patient ID: MARRISA KIMBER, female   DOB: Mar 16, 1917, 77 y.o.   MRN: [id number] Patient ID: TOMIE SPIZZIRRI, female   DOB: 06/04/1917, 77 y.o.   MRN: [id number] Subjective/Complaints: Slept well.  Constipated "belly grumbling this morning."  Review of Systems  Musculoskeletal: Positive for joint pain.  All other systems reviewed and are negative.   Objective: Vital Signs: Blood pressure 177/73, pulse 78, temperature 97.8 F (36.6 C), temperature source Oral, resp. rate 18, height 5\' 4"  (1.626 m), weight 58.7 kg (129 lb 6.6 oz), SpO2 96.00%. No results found.  Basename 07/12/11 0905  WBC 6.8  HGB 10.1*  HCT 30.5*  PLT 395   No results found for this basename: NA:2,K:2,CL:2,CO2:2,GLUCOSE:2,BUN:2,CREATININE:2,CALCIUM:2 in the last 72 hours CBG (last 3)  No results found for this basename: GLUCAP:3 in the last 72 hours  Wt Readings from Last 3 Encounters:  07/05/11 58.7 kg (129 lb 6.6 oz)  07/02/11 64.4 kg (141 lb 15.6 oz)  07/02/11 64.4 kg (141 lb 15.6 oz)    Physical Exam:  General appearance: alert and no distress Head: Normocephalic, without obvious abnormality, atraumatic Eyes: conjunctivae/corneas clear. PERRL, EOM's intact. Fundi benign. Ears: normal TM's and external ear canals both ears Nose: Nares normal. Septum midline. Mucosa normal. No drainage or sinus tenderness. Throat: lips, mucosa, and tongue normal; teeth and gums normal Neck: no adenopathy, no carotid bruit, no JVD, supple, symmetrical, trachea midline and thyroid not enlarged, symmetric, no tenderness/mass/nodules Back: symmetric, no curvature. ROM normal. No CVA tenderness. Resp: clear to auscultation bilaterally Cardio: regular rate and rhythm, S1, S2 normal, no murmur, click, rub or gallop GI: soft, non-tender; bowel sounds normal; no masses,  no  organomegaly Extremities: extremities normal, atraumatic, no cyanosis or edema Pulses: 2+ and symmetric Skin: Skin color, texture, turgor normal. No rashes or lesions Neurologic: Grossly normal  Pt is oriented and cognitively appropriate this am.  Pain inhib weakness in left leg Incision/Wound: wound c/d/i with staples   Leg tenderness much improved. Blood blister lower on thigh resolved with dry abraded looking area.  Changed dressing to mepilex lite.   Assessment/Plan: 1. Functional deficits secondary to left FNF with hip hemi which require 3+ hours per day of interdisciplinary therapy in a comprehensive inpatient rehab setting. Physiatrist is providing close team supervision and 24 hour management of active medical problems listed below. Physiatrist and rehab team continue to assess barriers to discharge/monitor patient progress toward functional and medical goals.  D/c 12/20  Mobility: Bed Mobility Bed Mobility: Yes Supine to Sit: 5: Supervision Sitting - Scoot to Edge of Bed: 5: Supervision Sit to Supine - Left: 4: Min assist Transfers Transfers: Yes Sit to Stand: 5: Supervision Stand to Sit: 5: Supervision Stand Pivot Transfers: 5: Supervision Ambulation/Gait Ambulation/Gait Assistance: 5: Supervision Ambulation/Gait Assistance Details (indicate cue type and reason): left foot placement Ambulation Distance (Feet): 10 Feet Assistive device: Rolling walker Gait Pattern: Antalgic;Decreased step length - right Stairs: No Stairs Assistance: 5: Supervision Stair Management Technique: Step to pattern;Two rails Number of Stairs: 12  Wheelchair Mobility Wheelchair Mobility: Yes Wheelchair Assistance: 4: Systems analyst: Both upper extremities Wheelchair Parts Management: Needs assistance Distance: 100 ADL:   Cognition: Cognition Overall Cognitive Status: Appears within functional limits for tasks assessed Arousal/Alertness: Awake/alert Orientation Level:  Oriented X4 Cognition Arousal/Alertness: Awake/alert Orientation  Level: Oriented X4   1. DVT Prophylaxis/Anticoagulation: Continue coumadin for 4 weeks per protocol.  Serial INR's. HGB stable  2. Pain Management: better with prn oxycodone prior to therapies. Family reports no worsening of cognition post narcotic doses. Scheduled tramadol helpful.   3. Mood:Pleasant and appropriated. Motivated to get better.   4. Fracture, femur (07/02/2011) : Underwent left hip hemi on 12/09 and is WBAT  5. SSS: PPM in place. Continue cardizem   6. GOUT:  allopurinol to avoid flare up.   7. HYPOTHYROIDISM: Continue supplement.   8. HYPERTENSION: Monitor with bid checks. Continue cardizem 180 mg daily.   9. Sundowning/ Insomnia: Low dose xanax at bedtime effective.Sleeping well. Doing surprisingly well with this.     10. Anemia: Resumed iron supplement.Hemoglobin with further improvement yestday.  Had had some BRBPR due to hems flare up.    11. Constipation chronic plus pain meds Miralax and docusate not effective.  Enema prior to discharge.   Anusol HC suppositories for hems flare up.  12. Wounds: Healing well will d/c staples.  Continue tegaderm to thigh.      LOS (Days) 8 A FACE TO FACE EVALUATION WAS PERFORMED  Jacquelynn Cree 07/13/2011, 8:41 AM

## 2011-07-13 NOTE — Progress Notes (Signed)
Patient ID: [name], female   DOB: September 07, 1916, 77 y.o.   MRN: [id number] Patient ID: KONA YUSUF, female   DOB: 1916/10/11, 77 y.o.   MRN: [id number] Patient ID: MAILYNN EVERLY, female   DOB: 1916/10/04, 77 y.o.   MRN: [id number] Patient ID: KRISTINA MCNORTON, female   DOB: 06-Oct-1916, 77 y.o.   MRN: [id number] Patient ID: AUSTINA CONSTANTIN, female   DOB: 10/20/1916, 77 y.o.   MRN: [id number] Subjective/Complaints: Slept well.  Feels like she's getting stronger each day.  No new problems  Review of Systems  Musculoskeletal: Positive for joint pain.  All other systems reviewed and are negative.   Objective: Vital Signs: Blood pressure 177/73, pulse 78, temperature 97.8 F (36.6 C), temperature source Oral, resp. rate 18, height 5\' 4"  (1.626 m), weight 58.7 kg (129 lb 6.6 oz), SpO2 96.00%. No results found.  Basename 07/12/11 0905  WBC 6.8  HGB 10.1*  HCT 30.5*  PLT 395   No results found for this basename: NA:2,K:2,CL:2,CO2:2,GLUCOSE:2,BUN:2,CREATININE:2,CALCIUM:2 in the last 72 hours CBG (last 3)  No results found for this basename: GLUCAP:3 in the last 72 hours  Wt Readings from Last 3 Encounters:  07/05/11 58.7 kg (129 lb 6.6 oz)  07/02/11 64.4 kg (141 lb 15.6 oz)  07/02/11 64.4 kg (141 lb 15.6 oz)    Physical Exam:  General appearance: alert and no distress Head: Normocephalic, without obvious abnormality, atraumatic Eyes: conjunctivae/corneas clear. PERRL, EOM's intact. Fundi benign. Ears: normal TM's and external ear canals both ears Nose: Nares normal. Septum midline. Mucosa normal. No drainage or sinus tenderness. Throat: lips, mucosa, and tongue normal; teeth and gums normal Neck: no adenopathy, no carotid bruit, no JVD, supple, symmetrical, trachea midline and thyroid not enlarged, symmetric, no tenderness/mass/nodules Back: symmetric, no curvature. ROM normal. No CVA tenderness. Resp: clear to auscultation bilaterally Cardio: regular rate and rhythm, S1, S2  normal, no murmur, click, rub or gallop GI: soft, non-tender; bowel sounds normal; no masses,  no organomegaly Extremities: extremities normal, atraumatic, no cyanosis or edema Pulses: 2+ and symmetric Skin: Skin color, texture, turgor normal. No rashes or lesions Neurologic: Grossly normal  Pt is oriented and cognitively appropriate this am.  Pain inhib weakness in left leg Incision/Wound: wound c/d/i with staples   Leg appropriately tender.  There is a blood blister lower on thigh stable.   Assessment/Plan: 1. Functional deficits secondary to left FNF with hip hemi which require 3+ hours per day of interdisciplinary therapy in a comprehensive inpatient rehab setting. Physiatrist is providing close team supervision and 24 hour management of active medical problems listed below. Physiatrist and rehab team continue to assess barriers to discharge/monitor patient progress toward functional and medical goals.  D/c 12/20.  Finalizing d/c planning  Mobility: Bed Mobility Bed Mobility: Yes Supine to Sit: 5: Supervision Sitting - Scoot to Edge of Bed: 5: Supervision Sit to Supine - Left: 4: Min assist Transfers Transfers: Yes Sit to Stand: 5: Supervision Stand to Sit: 5: Supervision Stand Pivot Transfers: 5: Supervision Ambulation/Gait Ambulation/Gait Assistance: 5: Supervision Ambulation/Gait Assistance Details (indicate cue type and reason): left foot placement Ambulation Distance (Feet): 10 Feet Assistive device: Rolling walker Gait Pattern: Antalgic;Decreased step length - right Stairs: No Stairs Assistance: 5: Supervision Stair Management Technique: Step to pattern;Two rails Number of Stairs: 12  Wheelchair Mobility Wheelchair Mobility: Yes Wheelchair Assistance: 4: Min Education officer, museum: Both upper extremities Wheelchair Parts Management: Needs assistance Distance: 100 ADL:   Cognition: Cognition Overall Cognitive Status:  Appears within functional limits for  tasks assessed Arousal/Alertness: Awake/alert Orientation Level: Oriented X4 Cognition Arousal/Alertness: Awake/alert Orientation Level: Oriented X4   1. DVT Prophylaxis/Anticoagulation: Continue coumadin. Serial INR's. HGB stable  2. Pain Management: better with prn oxycodone prior to therapies. Family reports no worsening of cognition post narcotic doses. Scheduled tramadol helpful.   3. Mood:Pleasant and appropriated. Motivated to get better.   4. Fracture, femur (07/02/2011) : Underwent left hip hemi on 12/09 and is WBAT  5. SSS: PPM in place. Continue cardizem   6. GOUT:  allopurinol to avoid flare up.   7. HYPOTHYROIDISM: Continue supplement.   8. HYPERTENSION: Monitor with bid checks. Continue cardizem 180 mg daily.   9. Sundowning/ Insomnia: Low dose xanax at bedtime effective.Sleeping well. Doing surprisingly well with this.     10. Anemia: Resumed iron supplement. hgb improved. No active signs of bleeding.  11. Constipation chronic plus pain meds Miralax, add docusate  12. Wounds: remove staples before d/c.  Continue tegaderm to thigh.      LOS (Days) 8 A FACE TO FACE EVALUATION WAS PERFORMED  SWARTZ,ZACHARY T 07/13/2011, 9:38 AM

## 2011-07-13 NOTE — Progress Notes (Signed)
Pt escorted to private vehicle via wc by Geoffery Spruce, CNA. Caregiver at side.

## 2011-07-13 NOTE — Progress Notes (Signed)
Patient Details  Name: [name] MRN: [id number] Date of Birth: 1917/03/16  Today's Date: 07/13/2011 No formal TR eval and treatment due to limited interest in TR services and decreased activity tolerance.  Will continue to monitor.  Stevin Bielinski 07/13/2011, 8:17 AM

## 2011-07-13 NOTE — Progress Notes (Signed)
Social Work  Discharge Note  The overall goal for the admission was met for:   Discharge location: Yes - home with family to assist  Length of Stay: Yes - 8 days  Discharge activity level: Yes - supervision with some minimal assist needs  Home/community participation: Yes  Services provided included: MD, RD, PT, OT, RN, CM, TR, Pharmacy and SW  Financial Services: Medicare and Other: [employer]  Follow-up services arranged: Home Health: RN, PT and OT via [facility] and Patient/Family request agency [facility]: [facility], DME: has all needed DME  Comments (or additional information):  Patient/Family verbalized understanding of follow-up arrangements: Yes  Individual responsible for coordination of the follow-up plan: daughter-in-law and patient  Confirmed correct DME delivered: NA  [name]

## 2011-07-13 NOTE — Progress Notes (Signed)
Administered enemeez to facilitate BM before discharge. Digital inspection by Marissa Nestle, PA showed soft stool in upper vault.

## 2011-07-13 NOTE — Progress Notes (Signed)
ANTICOAGULATION CONSULT NOTE - Follow Up Consult  Pharmacy Consult:  Coumadin Indication: VTE px s/p hip fx repair  Allergies  Allergen Reactions  . Aspirin   . Penicillins Rash    Patient Measurements: Height: 5\' 4"  (162.6 cm) Weight: 129 lb 6.6 oz (58.7 kg) IBW/kg (Calculated) : 54.7   Vital Signs: Temp: 97.8 F (36.6 C) (12/20 0500) Temp src: Oral (12/20 0500) BP: 177/73 mmHg (12/20 0500) Pulse Rate: 78  (12/20 0500)  Labs:  Basename 07/13/11 0705 07/12/11 0905 07/12/11 0700 07/11/11 0620  HGB -- 10.1* -- --  HCT -- 30.5* -- --  PLT -- 395 -- --  APTT -- -- -- --  LABPROT 23.3* -- 21.0* 21.1*  INR 2.03* -- 1.78* 1.79*  HEPARINUNFRC -- -- -- --  CREATININE -- -- -- --  CKTOTAL -- -- -- --  CKMB -- -- -- --  TROPONINI -- -- -- --   Estimated Creatinine Clearance: 38.7 ml/min (by C-G formula based on Cr of 0.67).   Assessment: 89 YOF on Coumadin for VTE px s/p hip fx repair. INR at low end of goal today. CBC WNL 12/19.  Goal of Therapy:  INR 2-3   Plan:  1.  Continue Coumadin 3 mg q 1800 2.  Daily PT/INR for now   Araly Kaas K. Allena Katz, PharmD, BCPS.  Clinical Pharmacist Pager [phone]. 07/13/2011 11:52 AM

## 2011-07-13 NOTE — Progress Notes (Signed)
Discharge summary # [id number].

## 2011-07-14 ENCOUNTER — Telehealth: Payer: Self-pay | Admitting: Internal Medicine

## 2011-07-14 NOTE — Discharge Summary (Signed)
[name], [name]              ACCOUNT NO.:  [id number]  MEDICAL RECORD NO.:  [id number]  LOCATION:  4140                         FACILITY:  [facility]  PHYSICIAN:  Ranelle Oyster, M.D.DATE OF BIRTH:  26-Aug-1916  DATE OF ADMISSION:  07/05/2011 DATE OF DISCHARGE:  07/13/2011                              DISCHARGE SUMMARY   DISCHARGE DIAGNOSES: 1. Left femur fracture with bipolar arthroplasty. 2. Acute blood loss anemia. 3. Constipation. 4. Hypothyroid. 5. Hypertension. 6. Sundowning, resolved.  HISTORY OF PRESENT ILLNESS:  [name] is a 77 year old female who fell backwards while walking without her walker and sustained left femur fracture on July 02, 2011.  The patient evaluated by Dr. Luiz Blare and underwent left hip bipolar arthroplasty on the same day.  Postop, she is weightbearing as tolerated and on Coumadin and Lovenox bridge for DVT prophylaxis.  The patient has had problems with confusion and hallucinations since admission.  PT eval done and the patient unable to perform bed mobility due to increased pain with left lower extremity movement.  However, she was able to get up in a chair today.  The patient's MD and family were requesting CIR as the patient with great success with prior CIR stay.  The patient was evaluated by rehab and we felt that she would benefit from a inpatient rehab program.  PAST MEDICAL HISTORY:  Significant for permanent pacemaker for heart block, small-bowel obstruction, depression, hypertension, hypokalemia, recurrent UTI, gout, GERD, history of duodenal ulcer, cervical myelopathy with spinal cord injury in August 2011, chronic gait disorder, anemia and history of DVT in the past.  PAST SURGICAL HISTORY:  Positive for replacement of pacemaker in April 2011.  REVIEW OF SYSTEMS:  Positive for left hip pain with an activity, frequency with nocturia x3, constipation, decreased hearing.  FAMILY HISTORY:  Noncontributory due to advanced  age.  SOCIAL HISTORY:  The patient lives alone and was independent prior to admission.  She does have a sitter during the day for about 8 hours and family provides supervision at nights.  She quit tobacco 30 years ago. Does not use any smokeless tobacco.  Does not use any alcohol or illicit drugs.  She is very active and does a home exercise program daily. House is 1 level.  No steps at entry.  FUNCTIONAL HISTORY:  The patient was independent prior to admission with use of rolling walker.  She needed some assist with ADLs and homemaking.  FUNCTIONAL STATUS:  The patient is +2 total assist 40% for bed mobility, max assist to scoot at edge of bed.  She is +2 total assist 60% for transfers.  Able to ambulate 3-4 steps with +2 total assist 40%.  The patient is min assist for upper body bathing and dressing requiring max assist for lower body bathing and dressing.  PHYSICAL EXAMINATION:  VITALS:  Blood pressure 138/67, pulse 65, temperature 97.5. GENERAL:  The patient is a well-nourished, well-developed, elderly female, alert and oriented x3, in no acute distress. HEENT:  Oral mucosa is clear and moist with the patient having a full set upper and partial lower dentures.  Pupils equal, round and reactive to light.  Hearing decreased. NECK:  Supple with normal  range of motion. CARDIOVASCULAR:  Normal rate and regular rhythm. PULMONARY:  The patient with normal respiratory effort.  Breath sounds normal. ABDOMEN:  Soft and nontender with positive bowel sounds. MUSCULOSKELETAL:  Moderate edema left hip with staples intact.  Incision without erythema.  The patient tends to keep left foot inverted, knee immobilizer in place. NEUROLOGIC:  The patient is alert and oriented x3.  She has normal reflexes and cranial nerve deficits present.  Memory intact.  Follows commands without difficulty.  A little distractible at time and sometimes tangential with thought processing, harder hearing. SKIN:   Warm and dry. PSYCHIATRIC:  The patient with normal mood and affect.  Judgment, thought and behavior is normal.  HOSPITAL COURSE:  [name] was admitted to rehab on July 05, 2011, for inpatient therapies to consist of PT, OT at least 3 hours 5 days a week.  Past admission, physiatrist, rehab, RN, and therapy team have worked together to provide customized collaborative interdisciplinary care.  The patient was maintained on Coumadin for DVT prophylaxis throughout his stay.  LABS:  Checked past admission on July 06, 2011, revealed sodium 138, potassium 4.1, chloride 104, CO2 27, BUN 12, creatinine 0.67, glucose 90.  Followup on her acute blood loss anemia revealed H and H at 9.0 and 27.3.  Rehab RN has worked with the patient on bowel and bladder program as well as pain management and wound monitoring during this stay.  The patient was started on Senokot S schedule as well as p.r.n. MiraLax to help with the constipation issues.  She was noted to have some bright red blood per rectum due to hemorrhoidal flare.  A CBC was repeated on July 12, 2011, showing H and H to be improving at 10.1 and 30.5. The patient was started on Anusol-HC suppository to help with the hemorrhoidal symptoms.  She did require disimpaction on July 13, 2011, prior to discharge.  The patient and family have been advised about using MiraLax on b.i.d. basis as well as senna at bedtime to help with constipation issues.  The patient's pain has been reasonably controlled with a premedication by Vicodin prior to her therapy sessions.  Her hip incision has been monitored along.  This was noted to be healing well without any signs or symptoms of infection.  Staples and area Steri-Stripped on July 13, 2011, prior to discharge.  During the patient's stay in rehab, weekly team conferences were held to monitor the patient's progress, set goals, as well as discuss barriers to discharge.  At time of  admission, the patient was noted to be limited by muscle weakness, decreased cardiorespiratory endurance, as well as decreased standing balance.  She was also noted to have difficulty maintaining her total hip precautions.  She was at moderate assist overall for mobility, moderate assist to ambulate 10 feet with verbal cues for technique and antalgic gait.  OT evaluation revealed the patient at supervision level for upper body ADLs, mod to total assist for lower body ADLs due to muscle weakness.  She was noted to be deconditioned requiring multiple rest breaks to complete her ADL tasks. PT and OT has been working with the patient with focus on hip precautions as well as use of Adaptic equipment to help carry out her ADL tasks.  By the time of discharge, the patient was able to bathe bilateral lower extremities without use of Adaptic equipment.  She was able to perform tub bench transfers with stand pivot transfers.  She requires some assistance  to left leg over edge of the tub.  The patient is currently at supervision level for upper and lower body dressing with use of Adaptic equipment, requires min assist for bathing without Adaptic equipment.  She is supervision for toileting and toilet transfers as well as supervision for simple kitchen management tasks while standing.  She continues to require rest breaks during her ADL tasks.  The patient's caregiver has been present and educated regarding appropriate assistance needed to past discharge.  Physical therapy has been ongoing and the patient has made good progress in her mobility. She is currently at supervision level for ambulating 150 feet with rolling walker.  Requires verbal cues for turns.  PT has also been working on strengthening exercises as well as dynamic balance. Currently, the patient requires min assist with left lower extremity for car transfers.  Verbal cues requires to avoid hip internal rotation. Supervision is  recommended past discharge and the patient's caregiver and family to provide this past.  Further follow up home health, PT, OT to [facility] past discharge.  On July 13, 2011, the patient is discharged to home.  DISCHARGE MEDICATIONS: 1. Norco 5-325 one p.o. b.i.d. p.r.n. pain #45 prescription. 2. Anusol HC suppositories 1 per rectum b.i.d. x1 week. 3. Senokot-S 2 p.o. at bedtime. 4. MiraLax 17 g in 8 ounces p.o. b.i.d. 5. Coumadin 2 mg 1.5 p.o. q.p.m. 6. Tylenol 325-650 mg q.4 h. p.r.n. mild pain. 7. Allopurinol 100 mg p.o. per day. 8. Xanax 0.5 mg at bedtime. 9. Celexa 10 mg p.o. per day. 10.Cardizem CD 180 mg p.o. per day. 11.Boost supplements b.i.d. 12.Folic acid 1 mg p.o. per day. 13.Gelnique applied to skin daily. 14.Levothyroxine 112 mcg p.o. per day. 15.Protonix 40 mg p.o. per day. 16.Niferex 150 mg p.o. b.i.d.  DIET INSTRUCTION:  Regular.  ACTIVITY LEVEL:  At 24 hours supervision, ambulate with walker, maintain left total hip precautions.  Bathe, dress with assistance.  WOUND CARE:  Keep the area clean and dry.  SPECIAL INSTRUCTIONS:  [facility] to provide PT, OT and RN. Coumadin protocol for a month for DVT prophylaxis.  FOLLOWUP:  The patient to follow up with Dr. Luiz Blare in 2 weeks for postop check.  Follow up with Dr. Benedetto Goad for routine check in 2 weeks.  Follow up with Kallie Edward, nurse practitioner on July 31, 2010, at 10:30.     Delle Reining, P.A.   ______________________________ Ranelle Oyster, M.D.    PL/MEDQ  D:  07/13/2011  T:  07/14/2011  Job:  [id number]  cc:   Harvie Junior, M.D. Gloriajean Dell. Andrey Campanile, M.D.

## 2011-07-14 NOTE — Telephone Encounter (Signed)
New msg [name]- pt's caregiver called She wants to know if pt needs to come in to see Dr allred before she starts phone transmissions

## 2011-07-14 NOTE — Telephone Encounter (Signed)
Spoke with caregiver(sandra).  [name] is due to see Dr. Johney Frame in the office in April then we will start [facility].  [name] to call and schedule the appointment.

## 2011-07-20 ENCOUNTER — Encounter: Payer: PRIVATE HEALTH INSURANCE | Admitting: Internal Medicine

## 2011-08-01 ENCOUNTER — Encounter: Payer: Medicare Other | Attending: Neurosurgery | Admitting: Neurosurgery

## 2011-08-01 DIAGNOSIS — Z950 Presence of cardiac pacemaker: Secondary | ICD-10-CM | POA: Insufficient documentation

## 2011-08-01 DIAGNOSIS — M25559 Pain in unspecified hip: Secondary | ICD-10-CM

## 2011-08-01 DIAGNOSIS — E039 Hypothyroidism, unspecified: Secondary | ICD-10-CM | POA: Insufficient documentation

## 2011-08-01 DIAGNOSIS — Z96649 Presence of unspecified artificial hip joint: Secondary | ICD-10-CM | POA: Insufficient documentation

## 2011-08-01 DIAGNOSIS — I519 Heart disease, unspecified: Secondary | ICD-10-CM | POA: Insufficient documentation

## 2011-08-01 DIAGNOSIS — I10 Essential (primary) hypertension: Secondary | ICD-10-CM | POA: Insufficient documentation

## 2011-08-01 DIAGNOSIS — G8928 Other chronic postprocedural pain: Secondary | ICD-10-CM

## 2011-08-02 NOTE — Progress Notes (Signed)
This is a 78 year old female that was seen and evaluated in inpatient rehab in December 2012, after a fall and a fractured left hip.  She was subsequently seen and evaluated by Dr. Luiz Blare who did a hemiarthroplasty on the left.  She has tolerated that well.  She had a 8-day stay in rehab and has a CNA with her today that has been ordered for 2 years and states she has done quite well.  She only rates her average pain as 3. She has pretty good range of motion in legs.  She is still in home health physical therapy and will continue that for several more weeks. She using a rolling walker for ambulation.  She had some pain in the mornings.  Her sleep patterns are good.  She ambulates independently. She takes very little in the way of pain medicine.  She usually uses Tylenol according to CNA.  She does use rolling walker possibly, she does not drive or climb steps.  She can only walk about 10 minutes at a time.  She needs some help with ADLs.  She is retired.  REVIEW OF SYSTEMS:  Notable for difficulties as described above as well as some bladder control issues, constipation otherwise within normal limits.  Physicians are her primary care doctor, Dr. Benedetto Goad, and Dr. Luiz Blare, orthopedic surgeon.  PAST MEDICAL HISTORY:  Significant for some heart disease, thyroid issues, intestinal problems.  She had gallbladder surgery in the past. She has had a pacemaker and she had some colon surgery.  SOCIAL HISTORY:  She is widowed.  She lives alone.  She does have a CNA that takes care of her.  FAMILY HISTORY:  Significant for heart disease and stroke.  PHYSICAL EXAMINATION:  VITAL SIGNS:  Her blood pressure is 128/68, pulse 88, respirations 18, O2 sats 99 on room air. CONSTITUTIONALLY:  Within normal limits.  She is alert and oriented x3. She communicates clearly.  Speech is good. MUSCULOSKELETAL:  She has limited range of motion in the left lower extremity.  Strength is about 4/5 in the  iliopsoas, confrontation testing.  Sensation is intact.  Pulses good.  ASSESSMENT:  History of left femur fracture with hemiarthroplasty on this side with Dr. Luiz Blare, question anemia, she is going to go by her primary care when she leaves here today to get some blood drawn.  She also has history of hypothyroidism and hypertension.  PLAN:  She will continue her home health physical therapy, with [facility], if they need to continue that then send an order over and we will okay that.  She will follow up with Dr. Riley Kill in 6-8 weeks.  Her questions were otherwise encouraged and answered and we will see her back.     Ashby Leflore L. Blima Dessert Electronically Signed    RLW/MedQ D:  08/01/2011 11:12:02  T:  08/02/2011 03:52:31  Job #:  [id number]

## 2011-08-09 DIAGNOSIS — Z0271 Encounter for disability determination: Secondary | ICD-10-CM

## 2011-09-19 ENCOUNTER — Ambulatory Visit: Payer: Medicare Other | Admitting: Physical Medicine & Rehabilitation

## 2011-10-06 ENCOUNTER — Encounter: Payer: Self-pay | Admitting: Physical Medicine & Rehabilitation

## 2011-10-06 ENCOUNTER — Encounter: Payer: Medicare Other | Attending: Neurosurgery | Admitting: Physical Medicine & Rehabilitation

## 2011-10-06 VITALS — BP 133/71 | HR 84 | Resp 16 | Ht 64.0 in | Wt 147.0 lb

## 2011-10-06 DIAGNOSIS — E039 Hypothyroidism, unspecified: Secondary | ICD-10-CM | POA: Insufficient documentation

## 2011-10-06 DIAGNOSIS — Z950 Presence of cardiac pacemaker: Secondary | ICD-10-CM | POA: Insufficient documentation

## 2011-10-06 DIAGNOSIS — IMO0002 Reserved for concepts with insufficient information to code with codable children: Secondary | ICD-10-CM

## 2011-10-06 DIAGNOSIS — S72009A Fracture of unspecified part of neck of unspecified femur, initial encounter for closed fracture: Secondary | ICD-10-CM

## 2011-10-06 DIAGNOSIS — S72002A Fracture of unspecified part of neck of left femur, initial encounter for closed fracture: Secondary | ICD-10-CM

## 2011-10-06 DIAGNOSIS — I519 Heart disease, unspecified: Secondary | ICD-10-CM | POA: Insufficient documentation

## 2011-10-06 DIAGNOSIS — I10 Essential (primary) hypertension: Secondary | ICD-10-CM | POA: Insufficient documentation

## 2011-10-06 DIAGNOSIS — S76019A Strain of muscle, fascia and tendon of unspecified hip, initial encounter: Secondary | ICD-10-CM | POA: Insufficient documentation

## 2011-10-06 DIAGNOSIS — R269 Unspecified abnormalities of gait and mobility: Secondary | ICD-10-CM

## 2011-10-06 DIAGNOSIS — Z96649 Presence of unspecified artificial hip joint: Secondary | ICD-10-CM | POA: Insufficient documentation

## 2011-10-06 NOTE — Patient Instructions (Signed)
Be careful not to overdue your weight lifting

## 2011-10-06 NOTE — Progress Notes (Signed)
  Subjective:    Patient ID: [name], female    DOB: Feb 07, 1917, 78 y.o.   MRN: [id number]  HPI  [name] is back regarding her left FNF and hip arthroplasty.  She is walking with a rolling walker.  She exercises at home, uses weights.  She's completed HH PT.  She's walking outside the house sometimes, but is generally exercising mostly in the bed or chair.    She occasionally uses a tylenol for pain.  She does report a spot along her inner thigh that occasional hurts especially when she first gets up in the morning.   Pain Inventory Average Pain 0 Pain Right Now 0 My pain is N/A  In the last 24 hours, has pain interfered with the following? General activity 0 Relation with others 0 Enjoyment of life 0 What TIME of day is your pain at its worst? N/A Sleep (in general) Poor  Pain is worse with: N/A Pain improves with: N/A Relief from Meds: 0  Mobility walk with assistance use a walker needs help with transfers  Function I need assistance with the following:  dressing, bathing, toileting, meal prep, household duties and shopping  Neuro/Psych bladder control problems  Prior Studies Any changes since last visit?  no  Physicians involved in your care Primary care Dr. Benedetto Goad Orthopedist        Review of Systems  Constitutional: Negative.   HENT: Negative.   Eyes: Negative.   Respiratory: Negative.   Cardiovascular: Negative.   Gastrointestinal: Negative.   Musculoskeletal: Negative.   Skin: Negative.   Neurological: Negative.   Hematological: Negative.   Psychiatric/Behavioral: Negative.        Objective:   Physical Exam  Constitutional: She is oriented to person, place, and time. She appears well-developed.  Eyes: EOM are normal. Pupils are equal, round, and reactive to light.  Neck: Normal range of motion.  Cardiovascular: Normal rate and regular rhythm.   Pulmonary/Chest: Effort normal.  Abdominal: Soft.  Musculoskeletal: Normal  range of motion.       Pain along left hip adductors with palpation and resisted hip flexion and adduction.  Gait slightly wide based and unsteady. Knees tend to buckle a bit.. No antalgia.  Neurological: She is alert and oriented to person, place, and time. She has normal strength and normal reflexes. No sensory deficit.  Psychiatric: She has a normal mood and affect. Her speech is normal and behavior is normal. Judgment and thought content normal. Cognition and memory are normal.          Assessment & Plan:  ASSESSMENT:  1. Hx of left FNF and hip hemi 2. Gait disorder 3. Mild left hip adductor strain  PLAN:  1. Mild heat and stretching for left hip adductor 2. She needs to be a little more careful with her weight lifting. 3. She should always be walking with a walker due to balance 4. Recommended f/u with Dr. Andrey Campanile over the next few months and with Dr. Luiz Blare per his direction.

## 2011-10-09 ENCOUNTER — Ambulatory Visit (INDEPENDENT_AMBULATORY_CARE_PROVIDER_SITE_OTHER): Payer: Medicare Other | Admitting: Internal Medicine

## 2011-10-09 ENCOUNTER — Encounter: Payer: Self-pay | Admitting: Internal Medicine

## 2011-10-09 VITALS — BP 126/67 | HR 93 | Resp 21 | Ht 63.0 in | Wt 147.8 lb

## 2011-10-09 DIAGNOSIS — I4430 Unspecified atrioventricular block: Secondary | ICD-10-CM

## 2011-10-09 LAB — PACEMAKER DEVICE OBSERVATION
AL THRESHOLD: 0.5 V
ATRIAL PACING PM: 87.5
BAMS-0001: 150 {beats}/min
RV LEAD THRESHOLD: 0.75 V
VENTRICULAR PACING PM: 99

## 2011-10-09 NOTE — Progress Notes (Signed)
The patient presents today for routine electrophysiology followup.  Since last being seen in our clinic, she reports doing very well.  She fell 12/12 and fractured her hip.  She is recovering from this.  She underwent PPM generator change by me 07/13/10 and has done well.  Today, she denies symptoms of palpitations, chest pain, shortness of breath, orthopnea, PND, lower extremity edema, dizziness, presyncope, syncope, or neurologic sequela.  The patient feels that she is tolerating medications without difficulties and is otherwise without complaint today.   Past Medical History  Diagnosis Date  . Complete heart block     s/p PPM by Dr Amil Amen 2004, recent Gen Change  07/13/10  . Small bowel obstruction   . Depression   . Hypertension   . Hypokalemia   . Recurrent UTI   . Gout   . GERD (gastroesophageal reflux disease)   . DVT (deep venous thrombosis)    Past Surgical History  Procedure Date  . Insert / replace / remove pacemaker     initial pacemaker by Dr Amil Amen 2004, replaced by [facility] for ERI 07/13/10  . Hip arthroplasty 07/02/2011    Procedure: ARTHROPLASTY BIPOLAR HIP;  Surgeon: Harvie Junior;  Location: [facility];  Service: Orthopedics;  Laterality: Left;    Current Outpatient Prescriptions  Medication Sig Dispense Refill  . allopurinol (ZYLOPRIM) 100 MG tablet Take 100 mg by mouth daily.        Marland Kitchen ALPRAZolam (XANAX) 0.5 MG tablet Take 0.25 mg by mouth at bedtime.        . citalopram (CELEXA) 10 MG tablet Take 10 mg by mouth daily.        Marland Kitchen diltiazem (DILACOR XR) 180 MG 24 hr capsule Take 180 mg by mouth daily.        Marland Kitchen ENSURE (ENSURE) Take 237 mLs by mouth 2 (two) times daily.        . folic acid (FOLVITE) 1 MG tablet Take 1 mg by mouth daily.        . Oxybutynin Chloride (GELNIQUE TD) Place 1 packet onto the skin daily.       . pantoprazole (PROTONIX) 40 MG tablet Take 40 mg by mouth daily.        Marland Kitchen senna-docusate (SENOKOT-S) 8.6-50 MG per tablet Take 2 tablets by mouth 2 (two) times  daily.      Marland Kitchen acetaminophen (TYLENOL) 325 MG tablet Take 650 mg by mouth every 4 (four) hours as needed. For pain.         Allergies  Allergen Reactions  . Aspirin   . Penicillins Rash    History   Social History  . Marital Status: Widowed    Spouse Name: N/A    Number of Children: N/A  . Years of Education: N/A   Occupational History  . Not on file.   Social History Main Topics  . Smoking status: Former Smoker    Quit date: 07/24/1980  . Smokeless tobacco: Not on file  . Alcohol Use: No  . Drug Use: No  . Sexually Active: Not on file   Other Topics Concern  . Not on file   Social History Narrative  . No narrative on file   Physical Exam: Filed Vitals:   10/09/11 1000  BP: 126/67  Pulse: 93  Resp: 21  Height: 5\' 3"  (1.6 m)  Weight: 147 lb 12.8 oz (67.042 kg)    GEN- The patient is well appearing, alert and oriented x 3 today.   Head-  normocephalic, atraumatic Eyes-  Sclera clear, conjunctiva pink Ears- hearing intact Oropharynx- clear Neck- supple, no JVP Lymph- no cervical lymphadenopathy Lungs- Clear to ausculation bilaterally, normal work of breathing Chest- pacemaker pocket is well healed Heart- Regular rate and rhythm, no murmurs, rubs or gallops, PMI not laterally displaced GI- soft, NT, ND, + BS Extremities- no clubbing, cyanosis, or edema MS- walks slowly with a walker  Pacemaker interrogation reviewed, see epic  Assessment and Plan:

## 2011-10-09 NOTE — Patient Instructions (Signed)
Remote monitoring is used to monitor your Pacemaker of ICD from home. This monitoring reduces the number of office visits required to check your device to one time per year. It allows us to keep an eye on the functioning of your device to ensure it is working properly. You are scheduled for a device check from home on January 11, 2012. You may send your transmission at any time that day. If you have a wireless device, the transmission will be sent automatically. After your physician reviews your transmission, you will receive a postcard with your next transmission date.  Your physician wants you to follow-up in: 1 year with Dr Allred.  You will receive a reminder letter in the mail two months in advance. If you don't receive a letter, please call our office to schedule the follow-up appointment.  

## 2011-10-09 NOTE — Assessment & Plan Note (Signed)
Normal pacemaker function See Pace Art report No changes today  carelink every 3 months Return in 12 months  

## 2012-01-11 ENCOUNTER — Encounter: Payer: Medicare Other | Admitting: *Deleted

## 2012-01-22 ENCOUNTER — Encounter: Payer: Self-pay | Admitting: *Deleted

## 2012-01-23 ENCOUNTER — Ambulatory Visit (INDEPENDENT_AMBULATORY_CARE_PROVIDER_SITE_OTHER): Payer: Medicare Other | Admitting: *Deleted

## 2012-01-23 ENCOUNTER — Encounter: Payer: Self-pay | Admitting: Internal Medicine

## 2012-01-23 DIAGNOSIS — I4430 Unspecified atrioventricular block: Secondary | ICD-10-CM

## 2012-01-26 LAB — REMOTE PACEMAKER DEVICE
AL IMPEDENCE PM: 444 Ohm
BAMS-0001: 150 {beats}/min
BATTERY VOLTAGE: 2.77 V
RV LEAD IMPEDENCE PM: 684 Ohm
RV LEAD THRESHOLD: 0.625 V

## 2012-01-29 ENCOUNTER — Telehealth: Payer: Self-pay | Admitting: Internal Medicine

## 2012-01-29 NOTE — Telephone Encounter (Signed)
Transmission received, patient aware. 

## 2012-01-29 NOTE — Telephone Encounter (Signed)
Please return call to [facility] caregiver  [phone]   Regarding remote transmission issues

## 2012-02-05 ENCOUNTER — Encounter: Payer: Self-pay | Admitting: *Deleted

## 2012-04-29 ENCOUNTER — Encounter: Payer: Medicare Other | Admitting: *Deleted

## 2012-05-08 ENCOUNTER — Encounter: Payer: Self-pay | Admitting: *Deleted

## 2012-05-16 ENCOUNTER — Ambulatory Visit (INDEPENDENT_AMBULATORY_CARE_PROVIDER_SITE_OTHER): Payer: Medicare Other | Admitting: *Deleted

## 2012-05-16 ENCOUNTER — Encounter: Payer: Self-pay | Admitting: Internal Medicine

## 2012-05-16 DIAGNOSIS — I4430 Unspecified atrioventricular block: Secondary | ICD-10-CM

## 2012-05-16 LAB — REMOTE PACEMAKER DEVICE
AL IMPEDENCE PM: 449 Ohm
ATRIAL PACING PM: 92
BAMS-0001: 150 {beats}/min
RV LEAD IMPEDENCE PM: 618 Ohm
VENTRICULAR PACING PM: 100

## 2012-05-20 ENCOUNTER — Encounter: Payer: Self-pay | Admitting: *Deleted

## 2012-08-19 ENCOUNTER — Ambulatory Visit (INDEPENDENT_AMBULATORY_CARE_PROVIDER_SITE_OTHER): Payer: Medicare Other | Admitting: *Deleted

## 2012-08-19 DIAGNOSIS — Z950 Presence of cardiac pacemaker: Secondary | ICD-10-CM

## 2012-08-19 DIAGNOSIS — I4430 Unspecified atrioventricular block: Secondary | ICD-10-CM

## 2012-08-19 LAB — REMOTE PACEMAKER DEVICE
AL THRESHOLD: 0.375 V
BAMS-0001: 150 {beats}/min
RV LEAD IMPEDENCE PM: 682 Ohm
VENTRICULAR PACING PM: 100

## 2012-09-10 ENCOUNTER — Encounter: Payer: Self-pay | Admitting: *Deleted

## 2012-09-19 ENCOUNTER — Encounter: Payer: Self-pay | Admitting: Internal Medicine

## 2012-09-30 ENCOUNTER — Encounter: Payer: Self-pay | Admitting: Internal Medicine

## 2012-09-30 ENCOUNTER — Ambulatory Visit (INDEPENDENT_AMBULATORY_CARE_PROVIDER_SITE_OTHER): Payer: Medicare Other | Admitting: Internal Medicine

## 2012-09-30 VITALS — BP 130/76 | HR 82 | Ht 61.0 in | Wt 147.6 lb

## 2012-09-30 DIAGNOSIS — I4430 Unspecified atrioventricular block: Secondary | ICD-10-CM

## 2012-09-30 DIAGNOSIS — I10 Essential (primary) hypertension: Secondary | ICD-10-CM

## 2012-09-30 DIAGNOSIS — Z950 Presence of cardiac pacemaker: Secondary | ICD-10-CM

## 2012-09-30 DIAGNOSIS — I495 Sick sinus syndrome: Secondary | ICD-10-CM

## 2012-09-30 LAB — PACEMAKER DEVICE OBSERVATION
AL AMPLITUDE: 2.8 mv
AL IMPEDENCE PM: 431 Ohm
AL THRESHOLD: 0.5 V
BATTERY VOLTAGE: 2.78 V
RV LEAD IMPEDENCE PM: 633 Ohm
RV LEAD THRESHOLD: 0.875 V

## 2012-09-30 NOTE — Progress Notes (Signed)
The patient presents today for routine electrophysiology followup.  Since last being seen in our clinic, she reports doing very well.  She has difficulty with unsteadiness and occasional double vision.  She also reports chronic difficulty with spinal stenosis.  Today, she denies symptoms of palpitations, chest pain, shortness of breath, orthopnea, PND, lower extremity edema, dizziness, presyncope, syncope, or neurologic sequela.  The patient feels that she is tolerating medications without difficulties and is otherwise without complaint today.   Past Medical History  Diagnosis Date  . Complete heart block     s/p PPM by Dr Amil Amen 2004, recent Gen Change  07/13/10  . Small bowel obstruction   . Depression   . Hypertension   . Hypokalemia   . Recurrent UTI   . Gout   . GERD (gastroesophageal reflux disease)   . DVT (deep venous thrombosis)    Past Surgical History  Procedure Laterality Date  . Insert / replace / remove pacemaker      initial pacemaker by Dr Amil Amen 2004, replaced by [facility] for ERI 07/13/10  . Hip arthroplasty  07/02/2011    Procedure: ARTHROPLASTY BIPOLAR HIP;  Surgeon: Harvie Junior;  Location: [facility];  Service: Orthopedics;  Laterality: Left;    Current Outpatient Prescriptions  Medication Sig Dispense Refill  . acetaminophen (TYLENOL) 325 MG tablet Take 650 mg by mouth every 4 (four) hours as needed. For pain.       Marland Kitchen allopurinol (ZYLOPRIM) 100 MG tablet Take 100 mg by mouth daily.        Marland Kitchen ALPRAZolam (XANAX) 0.5 MG tablet Take 0.25 mg by mouth at bedtime.        . citalopram (CELEXA) 10 MG tablet Take 10 mg by mouth daily.        Marland Kitchen diltiazem (DILACOR XR) 180 MG 24 hr capsule Take 180 mg by mouth daily.        Marland Kitchen ENSURE (ENSURE) Take 237 mLs by mouth 2 (two) times daily.        . folic acid (FOLVITE) 1 MG tablet Take 1 mg by mouth daily.        . Oxybutynin Chloride (GELNIQUE TD) Place 1 packet onto the skin daily.       . pantoprazole (PROTONIX) 40 MG tablet Take 40 mg  by mouth daily.        Marland Kitchen levothyroxine (SYNTHROID) 112 MCG tablet Take 1 tablet (112 mcg total) by mouth daily.  30 tablet  11  . levothyroxine (SYNTHROID, LEVOTHROID) 112 MCG tablet       . [DISCONTINUED] POLYSACCHARIDE IRON (IRON POLYSACCHARIDES) 150 MG CAPS Take 150 mg by mouth 2 (two) times daily.       . [DISCONTINUED] warfarin (COUMADIN) 2 MG tablet Take 1.5 tablets (3 mg total) by mouth daily at 6 PM.  45 tablet  0   No current facility-administered medications for this visit.    Allergies  Allergen Reactions  . Aspirin   . Penicillins Rash    History   Social History  . Marital Status: Widowed    Spouse Name: N/A    Number of Children: N/A  . Years of Education: N/A   Occupational History  . Not on file.   Social History Main Topics  . Smoking status: Former Smoker    Quit date: 07/24/1980  . Smokeless tobacco: Not on file  . Alcohol Use: No  . Drug Use: No  . Sexually Active: Not on file   Other Topics Concern  .  Not on file   Social History Narrative  . No narrative on file   Physical Exam: Filed Vitals:   09/30/12 1202  BP: 130/76  Pulse: 82  Height: 5\' 1"  (1.549 m)  Weight: 147 lb 9.6 oz (66.951 kg)    GEN- The patient is elderly appearing, alert and oriented x 3 today.   Head- normocephalic, atraumatic Eyes-  Sclera clear, conjunctiva pink Ears- hearing intact Oropharynx- clear Neck- supple,  Lungs- Clear to ausculation bilaterally, normal work of breathing Chest- pacemaker pocket is well healed Heart- Regular rate and rhythm, no murmurs, rubs or gallops, PMI not laterally displaced GI- soft, NT, ND, + BS Extremities- no clubbing, cyanosis, or edema MS- walks slowly with a walker  Pacemaker interrogation reviewed, see epic  Assessment and Plan:

## 2012-09-30 NOTE — Patient Instructions (Addendum)
Remote monitoring is used to monitor your Pacemaker of ICD from home. This monitoring reduces the number of office visits required to check your device to one time per year. It allows [place] to keep an eye on the functioning of your device to ensure it is working properly. You are scheduled for a device check from home on January 06, 2013. You may send your transmission at any time that day. If you have a wireless device, the transmission will be sent automatically. After your physician reviews your transmission, you will receive a postcard with your next transmission date.  Your physician wants you to follow-up in: 1 year with Sharrell Ku.  You will receive a reminder letter in the mail two months in advance. If you don't receive a letter, please call our office to schedule the follow-up appointment.  Get a follow up with your PCP in regards to your dizziness  Get an eye exam

## 2012-09-30 NOTE — Assessment & Plan Note (Signed)
Complete heart block today Normal pacemaker function See Pace Art report No changes today

## 2012-09-30 NOTE — Assessment & Plan Note (Signed)
Normal pacemaker function See Pace Art report No changes today  

## 2012-09-30 NOTE — Assessment & Plan Note (Signed)
Stable No change required today    Return to follow-up with my PA in 12 months carelink every 3 months

## 2013-01-06 ENCOUNTER — Encounter: Payer: Medicare Other | Admitting: *Deleted

## 2013-01-17 ENCOUNTER — Encounter: Payer: Self-pay | Admitting: *Deleted

## 2013-01-27 ENCOUNTER — Ambulatory Visit (INDEPENDENT_AMBULATORY_CARE_PROVIDER_SITE_OTHER): Payer: Medicare Other | Admitting: *Deleted

## 2013-01-27 ENCOUNTER — Encounter: Payer: Self-pay | Admitting: Internal Medicine

## 2013-01-27 DIAGNOSIS — Z950 Presence of cardiac pacemaker: Secondary | ICD-10-CM

## 2013-01-27 DIAGNOSIS — I4430 Unspecified atrioventricular block: Secondary | ICD-10-CM

## 2013-02-03 LAB — REMOTE PACEMAKER DEVICE
AL IMPEDENCE PM: 455 Ohm
ATRIAL PACING PM: 93
BATTERY VOLTAGE: 2.78 V
RV LEAD THRESHOLD: 0.875 V
VENTRICULAR PACING PM: 100

## 2013-02-07 ENCOUNTER — Encounter: Payer: Self-pay | Admitting: *Deleted

## 2013-02-10 ENCOUNTER — Telehealth: Payer: Self-pay | Admitting: Internal Medicine

## 2013-02-10 NOTE — Telephone Encounter (Signed)
Transmission was received on 01-27-13. Tried to return call but line ringing busy. Will try later.

## 2013-02-10 NOTE — Telephone Encounter (Signed)
New Prob     Pt wants to know if her transmission was received.

## 2013-02-11 NOTE — Telephone Encounter (Signed)
Spoke w/pt in regards to transmission. Pt aware letter will be sent.

## 2013-04-29 ENCOUNTER — Ambulatory Visit (INDEPENDENT_AMBULATORY_CARE_PROVIDER_SITE_OTHER): Payer: Medicare Other | Admitting: *Deleted

## 2013-04-29 DIAGNOSIS — Z950 Presence of cardiac pacemaker: Secondary | ICD-10-CM

## 2013-04-29 DIAGNOSIS — I4430 Unspecified atrioventricular block: Secondary | ICD-10-CM

## 2013-05-02 LAB — REMOTE PACEMAKER DEVICE
AL IMPEDENCE PM: 450 Ohm
ATRIAL PACING PM: 94
BAMS-0001: 150 {beats}/min
RV LEAD THRESHOLD: 0.625 V

## 2013-05-19 ENCOUNTER — Encounter: Payer: Self-pay | Admitting: Internal Medicine

## 2013-05-22 ENCOUNTER — Encounter: Payer: Self-pay | Admitting: Internal Medicine

## 2013-05-28 ENCOUNTER — Encounter: Payer: Self-pay | Admitting: Internal Medicine

## 2013-06-09 ENCOUNTER — Encounter: Payer: Self-pay | Admitting: Internal Medicine

## 2013-06-09 ENCOUNTER — Ambulatory Visit (INDEPENDENT_AMBULATORY_CARE_PROVIDER_SITE_OTHER): Payer: Medicare Other | Admitting: Internal Medicine

## 2013-06-09 VITALS — BP 126/64 | HR 68 | Ht 63.0 in | Wt 150.0 lb

## 2013-06-09 DIAGNOSIS — I4430 Unspecified atrioventricular block: Secondary | ICD-10-CM

## 2013-06-09 DIAGNOSIS — I10 Essential (primary) hypertension: Secondary | ICD-10-CM

## 2013-06-09 DIAGNOSIS — H3411 Central retinal artery occlusion, right eye: Secondary | ICD-10-CM

## 2013-06-09 DIAGNOSIS — H3410 Central retinal artery occlusion, unspecified eye: Secondary | ICD-10-CM | POA: Insufficient documentation

## 2013-06-09 LAB — MDC_IDC_ENUM_SESS_TYPE_INCLINIC
Brady Statistic AP VS Percent: 0.4 %
Brady Statistic AS VP Percent: 5.2 %
Lead Channel Impedance Value: 442 Ohm
Lead Channel Pacing Threshold Amplitude: 0.5 V
Lead Channel Pacing Threshold Amplitude: 0.875 V
Lead Channel Pacing Threshold Pulse Width: 0.4 ms
Lead Channel Setting Pacing Pulse Width: 0.4 ms

## 2013-06-09 MED ORDER — ASPIRIN EC 81 MG PO TBEC: 81.0000 mg | DELAYED_RELEASE_TABLET | Freq: Every day | ORAL | Status: DC

## 2013-06-09 NOTE — Progress Notes (Signed)
PCP: [name], MD  The patient presents today for urgent cardiology followup.  She recently developed abrupt onset of  R eye blindness.  She was evaluated by Dr Luciana Axe and felt to have a retinal artery occlusion.  She is referred to our office for further management.  Today, she denies symptoms of palpitations, chest pain, shortness of breath, orthopnea, PND, lower extremity edema, dizziness, presyncope, syncope, or neurologic sequela.  The patient feels that she is tolerating medications without difficulties and is otherwise without complaint today.   Past Medical History  Diagnosis Date  . Complete heart block     s/p PPM by Dr Amil Amen 2004, recent Gen Change  07/13/10  . Small bowel obstruction   . Depression   . Hypertension   . Hypokalemia   . Recurrent UTI   . Gout   . GERD (gastroesophageal reflux disease)   . DVT (deep venous thrombosis)    Past Surgical History  Procedure Laterality Date  . Insert / replace / remove pacemaker      initial pacemaker by Dr Amil Amen 2004, replaced by [facility] for ERI 07/13/10  . Hip arthroplasty  07/02/2011    Procedure: ARTHROPLASTY BIPOLAR HIP;  Surgeon: Harvie Junior;  Location: [facility];  Service: Orthopedics;  Laterality: Left;    Current Outpatient Prescriptions  Medication Sig Dispense Refill  . acetaminophen (TYLENOL) 325 MG tablet Take 650 mg by mouth every 4 (four) hours as needed. For pain.       Marland Kitchen allopurinol (ZYLOPRIM) 100 MG tablet Take 100 mg by mouth daily.        Marland Kitchen ALPRAZolam (XANAX) 0.5 MG tablet Take 0.25 mg by mouth at bedtime.        . citalopram (CELEXA) 10 MG tablet Take 10 mg by mouth daily.        Marland Kitchen diltiazem (DILACOR XR) 180 MG 24 hr capsule Take 180 mg by mouth daily.        Marland Kitchen ENSURE (ENSURE) Take 237 mLs by mouth 2 (two) times daily.        . folic acid (FOLVITE) 1 MG tablet Take 1 mg by mouth daily.        Marland Kitchen levothyroxine (SYNTHROID, LEVOTHROID) 112 MCG tablet       . Oxybutynin Chloride (GELNIQUE TD) Place 1  packet onto the skin daily.       . pantoprazole (PROTONIX) 40 MG tablet Take 40 mg by mouth daily.        Marland Kitchen levothyroxine (SYNTHROID) 112 MCG tablet Take 1 tablet (112 mcg total) by mouth daily.  30 tablet  11  . [DISCONTINUED] POLYSACCHARIDE IRON (IRON POLYSACCHARIDES) 150 MG CAPS Take 150 mg by mouth 2 (two) times daily.       . [DISCONTINUED] warfarin (COUMADIN) 2 MG tablet Take 1.5 tablets (3 mg total) by mouth daily at 6 PM.  45 tablet  0   No current facility-administered medications for this visit.    Allergies  Allergen Reactions  . Aspirin   . Penicillins Rash    History   Social History  . Marital Status: Widowed    Spouse Name: N/A    Number of Children: N/A  . Years of Education: N/A   Occupational History  . Not on file.   Social History Main Topics  . Smoking status: Former Smoker    Quit date: 07/24/1980  . Smokeless tobacco: Not on file  . Alcohol Use: No  . Drug Use: No  . Sexual Activity:  Not on file   Other Topics Concern  . Not on file   Social History Narrative  . No narrative on file   Physical Exam: Filed Vitals:   06/09/13 1004  BP: 126/64  Pulse: 68  Height: 5\' 3"  (1.6 m)  Weight: 150 lb (68.04 kg)    GEN- The patient is elderly appearing, alert and oriented x 3 today.   Head- normocephalic, atraumatic Eyes-  Sclera clear, conjunctiva pink , R eye blindness persists, she does not have temporal artery tenderness today Ears- hearing intact Oropharynx- clear Neck- supple,  Lungs- Clear to ausculation bilaterally, normal work of breathing Chest- pacemaker pocket is well healed Heart- Regular rate and rhythm, no murmurs, rubs or gallops, PMI not laterally displaced GI- soft, NT, ND, + BS Extremities- no clubbing, cyanosis, or edema MS- walks slowly with a walker  Pacemaker interrogation reviewed, see epic  Assessment and Plan:   1. 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  We  agree to proceed with carotid dopplers and an echo.  I will initiate ASA 81mg  daily.  She is reluctant to consider a higher dose given prior GI bleeding for which she was told that she should not take ASA in the future. I have reviewed her pacemaker interrogation today.  This reveals no atrial arrhythmias in excess of 30 seconds.  I therefore do not feel that afib is the cause for her event.  Given her advanced age, I am not certain that more aggressive workup would be beneficial.  Dr Pearlean Brownie has agreed to see her to evaluate for any additional neurologic sequela. She should also followup with Dr Andrey Campanile for further workup.  2. Complete heart block Normal pacemaker function See Pace Art report No changes today  3. HTN Stable No change required today  Return to see Norma Fredrickson in 4 weeks to followup on CV testing for central artery occlusion Carelink every 3 months If above is unrevealing then I will see in a year If other follow-up with me is necessary then this can be scheduled when she sees Lawson Fiscal in 4 weeks

## 2013-06-09 NOTE — Patient Instructions (Addendum)
Your physician recommends that you schedule a follow-up appointment in: 4 weeks with Sunday Spillers   Your physician wants you to follow-up in: 12 months with Dr Jacquiline Doe will receive a reminder letter in the mail two months in advance. If you don't receive a letter, please call our office to schedule the follow-up appointment.  Remote monitoring is used to monitor your Pacemaker or ICD from home. This monitoring reduces the number of office visits required to check your device to one time per year. It allows [place] to keep an eye on the functioning of your device to ensure it is working properly. You are scheduled for a device check from home on 09/10/13. You may send your transmission at any time that day. If you have a wireless device, the transmission will be sent automatically. After your physician reviews your transmission, you will receive a postcard with your next transmission date.  Your physician has recommended you make the following change in your medication:  1) Start Aspirin 81mg  daily  Your physician has requested that you have an echocardiogram. Echocardiography is a painless test that uses sound waves to create images of your heart. It provides your doctor with information about the size and shape of your heart and how well your heart's chambers and valves are working. This procedure takes approximately one hour. There are no restrictions for this procedure.  Your physician has requested that you have a carotid duplex. This test is an ultrasound of the carotid arteries in your neck. It looks at blood flow through these arteries that supply the brain with blood. Allow one hour for this exam. There are no restrictions or special instructions.  You have been referred to Dr Pearlean Brownie

## 2013-06-27 ENCOUNTER — Other Ambulatory Visit (HOSPITAL_COMMUNITY): Payer: Self-pay | Admitting: Internal Medicine

## 2013-06-27 ENCOUNTER — Other Ambulatory Visit (HOSPITAL_COMMUNITY): Payer: Self-pay | Admitting: Radiology

## 2013-06-27 ENCOUNTER — Ambulatory Visit (HOSPITAL_BASED_OUTPATIENT_CLINIC_OR_DEPARTMENT_OTHER): Payer: Medicare Other

## 2013-06-27 ENCOUNTER — Ambulatory Visit (HOSPITAL_COMMUNITY): Payer: Medicare Other | Attending: Internal Medicine | Admitting: Radiology

## 2013-06-27 DIAGNOSIS — I359 Nonrheumatic aortic valve disorder, unspecified: Secondary | ICD-10-CM | POA: Insufficient documentation

## 2013-06-27 DIAGNOSIS — H3410 Central retinal artery occlusion, unspecified eye: Secondary | ICD-10-CM

## 2013-06-27 DIAGNOSIS — I6529 Occlusion and stenosis of unspecified carotid artery: Secondary | ICD-10-CM

## 2013-06-27 DIAGNOSIS — H349 Unspecified retinal vascular occlusion: Secondary | ICD-10-CM

## 2013-06-27 DIAGNOSIS — I4430 Unspecified atrioventricular block: Secondary | ICD-10-CM

## 2013-06-27 DIAGNOSIS — I10 Essential (primary) hypertension: Secondary | ICD-10-CM | POA: Insufficient documentation

## 2013-06-27 DIAGNOSIS — R0989 Other specified symptoms and signs involving the circulatory and respiratory systems: Secondary | ICD-10-CM

## 2013-06-27 DIAGNOSIS — I442 Atrioventricular block, complete: Secondary | ICD-10-CM | POA: Insufficient documentation

## 2013-06-27 DIAGNOSIS — H3411 Central retinal artery occlusion, right eye: Secondary | ICD-10-CM

## 2013-06-27 DIAGNOSIS — Z87891 Personal history of nicotine dependence: Secondary | ICD-10-CM | POA: Insufficient documentation

## 2013-06-27 DIAGNOSIS — Z950 Presence of cardiac pacemaker: Secondary | ICD-10-CM | POA: Insufficient documentation

## 2013-06-27 DIAGNOSIS — I079 Rheumatic tricuspid valve disease, unspecified: Secondary | ICD-10-CM | POA: Insufficient documentation

## 2013-06-27 NOTE — Progress Notes (Signed)
Echocardiogram performed.  

## 2013-07-01 ENCOUNTER — Ambulatory Visit (INDEPENDENT_AMBULATORY_CARE_PROVIDER_SITE_OTHER): Payer: Medicare Other | Admitting: Nurse Practitioner

## 2013-07-01 ENCOUNTER — Encounter: Payer: Self-pay | Admitting: Nurse Practitioner

## 2013-07-01 VITALS — BP 130/72 | HR 84 | Ht 63.0 in | Wt 153.1 lb

## 2013-07-01 DIAGNOSIS — H3410 Central retinal artery occlusion, unspecified eye: Secondary | ICD-10-CM

## 2013-07-01 DIAGNOSIS — H3411 Central retinal artery occlusion, right eye: Secondary | ICD-10-CM

## 2013-07-01 NOTE — Progress Notes (Signed)
[name] Date of Birth: 1916-11-10 Medical Record #[id number]  History of Present Illness: [name] is seen back today for a follow up visit. Seen for Cindy. Johney [name]. She has PPM in place, depression, HTN, gout, GERD, and DVT.   Most recently here due to abrupt onset of right eye blindness - felt to have a retinal artery occlusion. Had echo and carotid. Low dose aspirin was started. Cindy. Johney [name] spoke with Cindy. Pearlean [name] and they agreed on low dose aspirin and did not feel an aggressive work up was in order. She has had   Comes back today. Here with her granddaughter. Doing ok. Seeing Cindy. Luciana [name] for her eye - has had a shot in her eye. No chest pain. Not short of breath. Forgets to use her walker at times but overall she is doing ok. Wants to know what her tests results are. Still with some visual defect and has compensated with patching at times.   Current Outpatient Prescriptions  Medication Sig Dispense Refill  . acetaminophen (TYLENOL) 325 MG tablet Take 650 mg by mouth every 4 (four) hours as needed. For pain.       Marland Kitchen allopurinol (ZYLOPRIM) 100 MG tablet Take 100 mg by mouth daily.        Marland Kitchen ALPRAZolam (XANAX) 0.5 MG tablet Take 0.25 mg by mouth at bedtime.        Marland Kitchen aspirin EC 81 MG tablet Take 1 tablet (81 mg total) by mouth daily.  90 tablet  3  . citalopram (CELEXA) 10 MG tablet Take 10 mg by mouth daily.        Marland Kitchen diltiazem (DILACOR XR) 180 MG 24 hr capsule Take 180 mg by mouth daily.        Marland Kitchen ENSURE (ENSURE) Take 237 mLs by mouth 2 (two) times daily.        . folic acid (FOLVITE) 1 MG tablet Take 1 mg by mouth daily.        Marland Kitchen levothyroxine (SYNTHROID, LEVOTHROID) 112 MCG tablet       . Oxybutynin Chloride (GELNIQUE TD) Place 1 packet onto the skin daily.       . pantoprazole (PROTONIX) 40 MG tablet Take 40 mg by mouth daily.        Marland Kitchen levothyroxine (SYNTHROID) 112 MCG tablet Take 1 tablet (112 mcg total) by mouth daily.  30 tablet  11  . [DISCONTINUED] POLYSACCHARIDE IRON (IRON  POLYSACCHARIDES) 150 MG CAPS Take 150 mg by mouth 2 (two) times daily.       . [DISCONTINUED] warfarin (COUMADIN) 2 MG tablet Take 1.5 tablets (3 mg total) by mouth daily at 6 PM.  45 tablet  0   No current facility-administered medications for this visit.    Allergies  Allergen Reactions  . Aspirin   . Penicillins Rash    Past Medical History  Diagnosis Date  . Complete heart block     s/p PPM by [name] 2004, recent Gen Change  07/13/10  . Small bowel obstruction   . Depression   . Hypertension   . Hypokalemia   . Recurrent UTI   . Gout   . GERD (gastroesophageal reflux disease)   . DVT (deep venous thrombosis)     Past Surgical History  Procedure Laterality Date  . Insert / replace / remove pacemaker      initial pacemaker by [name] 2004, replaced by [facility] for ERI 07/13/10  . Hip arthroplasty  07/02/2011    Procedure: ARTHROPLASTY  BIPOLAR HIP;  Surgeon: [name];  Location: [facility];  Service: Orthopedics;  Laterality: Left;    History  Smoking status  . Former Smoker  . Quit date: 07/24/1980  Smokeless tobacco  . Not on file    History  Alcohol Use No    History reviewed. No pertinent family history.  Review of Systems: The review of systems is per the HPI.  All other systems were reviewed and are negative.  Physical Exam: BP 130/72  Pulse 84  Ht 5\' 3"  (1.6 m)  Wt 153 lb 1.9 oz (69.455 kg)  BMI 27.13 kg/m2  SpO2 97% Patient is an elderly but pleasant and in no acute distress. Skin is warm and dry. Color is normal.  HEENT is unremarkable. Normocephalic/atraumatic. PERRL. Sclera are nonicteric. Neck is supple. No masses. No JVD. Lungs are clear. Cardiac exam shows a regular rate and rhythm. Abdomen is soft. Extremities are without edema. Gait and ROM are intact. No gross neurologic deficits noted.  LABORATORY DATA: Lab Results  Component Value Date   WBC 6.8 07/12/2011   HGB 10.1* 07/12/2011   HCT 30.5* 07/12/2011   PLT 395 07/12/2011    GLUCOSE 90 07/06/2011   CHOL  Value: 168        ATP III CLASSIFICATION:  <200     mg/dL   Desirable  841-324  mg/dL   Borderline High  >=401    mg/dL   High        0/27/2536   TRIG 139 03/04/2010   HDL 46 03/04/2010   LDLCALC  Value: 94        Total Cholesterol/HDL:CHD Risk Coronary Heart Disease Risk Table                     Men   Women  1/2 Average Risk   3.4   3.3  Average Risk       5.0   4.4  2 X Average Risk   9.6   7.1  3 X Average Risk  23.4   11.0        Use the calculated Patient Ratio above and the CHD Risk Table to determine the patient's CHD Risk.        ATP III CLASSIFICATION (LDL):  <100     mg/dL   Optimal  644-034  mg/dL   Near or Above                    Optimal  130-159  mg/dL   Borderline  742-595  mg/dL   High  >638     mg/dL   Very High 7/77/4332   ALT 20 07/06/2011   AST 17 07/06/2011   NA 138 07/06/2011   K 4.1 07/06/2011   CL 104 07/06/2011   CREATININE 0.67 07/06/2011   BUN 12 07/06/2011   CO2 27 07/06/2011   TSH 0.040* 02/28/2010   INR 2.03* 07/13/2011   Echo Study Conclusions  - Left ventricle: The cavity size was normal. There was focal basal hypertrophy. There was an increased relative contribution of atrial contraction to ventricular filling. - Aortic valve: There was mild stenosis. Trivial regurgitation. Mean gradient: 9mm Hg (S). Peak gradient: 14mm Hg (S). - Left atrium: The atrium was mildly to moderately dilated. - Pulmonary arteries: PA peak pressure: 34mm Hg (S).   Carotids with 1-39% bilateral stenosis.  Assessment / Plan: 1. Stroke - on low dose aspirin and doing ok. No bleeding reported and  she seems to be doing ok. She continues to be seen by Cindy. Luciana [name] for her eye.  2. HTN - BP is ok  3. Advanced age  79. PPM - followed by Cindy. Johney [name].   Overall, she is doing ok. Encouraged her to be safe as possible. See her back as planned.   [name] and will call if any problems develop in the interim.   Rosalio Macadamia,  RN, ANP-C [facility] [address] [place], [place]  [address]

## 2013-07-01 NOTE — Patient Instructions (Signed)
Stay on your current medicines  See Dr. Johney Frame back as planned  Stay safe  Call the [facility] office at [phone] if you have any questions, problems or concerns.

## 2013-07-09 ENCOUNTER — Other Ambulatory Visit: Payer: Self-pay

## 2013-07-14 ENCOUNTER — Encounter: Payer: Self-pay | Admitting: Internal Medicine

## 2013-08-01 ENCOUNTER — Encounter: Payer: Medicare Other | Admitting: *Deleted

## 2013-08-01 DIAGNOSIS — Z950 Presence of cardiac pacemaker: Secondary | ICD-10-CM

## 2013-08-01 DIAGNOSIS — I4430 Unspecified atrioventricular block: Secondary | ICD-10-CM

## 2013-08-01 LAB — MDC_IDC_ENUM_SESS_TYPE_REMOTE
Battery Impedance: 189 Ohm
Brady Statistic AP VP Percent: 95 %
Brady Statistic AP VS Percent: 0 %
Brady Statistic AS VP Percent: 4 %
Brady Statistic AS VS Percent: 0 %
Date Time Interrogation Session: 20150109114719
Lead Channel Pacing Threshold Amplitude: 0.5 V
Lead Channel Pacing Threshold Pulse Width: 0.4 ms
Lead Channel Setting Pacing Amplitude: 2.5 V
Lead Channel Setting Pacing Pulse Width: 0.4 ms
MDC IDC MSMT BATTERY REMAINING LONGEVITY: 110 mo
MDC IDC MSMT BATTERY VOLTAGE: 2.77 V
MDC IDC MSMT LEADCHNL RA IMPEDANCE VALUE: 449 Ohm
MDC IDC MSMT LEADCHNL RV IMPEDANCE VALUE: 733 Ohm
MDC IDC MSMT LEADCHNL RV PACING THRESHOLD AMPLITUDE: 0.75 V
MDC IDC MSMT LEADCHNL RV PACING THRESHOLD PULSEWIDTH: 0.4 ms
MDC IDC SET LEADCHNL RA PACING AMPLITUDE: 2 V
MDC IDC SET LEADCHNL RV SENSING SENSITIVITY: 2.8 mV

## 2013-08-18 ENCOUNTER — Encounter: Payer: Self-pay | Admitting: *Deleted

## 2013-08-20 ENCOUNTER — Encounter: Payer: Self-pay | Admitting: Internal Medicine

## 2013-08-25 ENCOUNTER — Telehealth: Payer: Self-pay | Admitting: Internal Medicine

## 2013-08-25 NOTE — Telephone Encounter (Signed)
New message     Need clarification on phone conversation from Friday patient having trouble hearing.

## 2013-08-25 NOTE — Telephone Encounter (Signed)
Spoke with patient's granddaughter It was not our office that called on Fri.  She is thinking that it was the orthopedic MD that did her hip surgery.  I let them know that was Dr Luiz BlareGraves

## 2013-08-27 ENCOUNTER — Encounter: Payer: Self-pay | Admitting: Neurology

## 2013-08-27 ENCOUNTER — Encounter (INDEPENDENT_AMBULATORY_CARE_PROVIDER_SITE_OTHER): Payer: Self-pay

## 2013-08-27 ENCOUNTER — Ambulatory Visit (INDEPENDENT_AMBULATORY_CARE_PROVIDER_SITE_OTHER): Payer: Medicare Other | Admitting: Neurology

## 2013-08-27 VITALS — Ht 64.0 in | Wt 154.0 lb

## 2013-08-27 DIAGNOSIS — H3410 Central retinal artery occlusion, unspecified eye: Secondary | ICD-10-CM

## 2013-08-27 NOTE — Patient Instructions (Signed)
I had a long discussion with the patient and caregiver regarding central retinal artery occlusion and her stroke risk factors and answered questions. Continue aspirin 81 mg daily for stroke prevention and maintain strict control of hypertension with blood pressure goal below 130/90. Check yearly carotid Dopplers. Return for followup in 3 months Larita FifeLynn, NP or call earlier if necessary

## 2013-08-27 NOTE — Progress Notes (Signed)
[facility] [address] [place] [id number] [phone]       OFFICE CONSULT NOTE  [name] Date of Birth:  [birth year] Medical Record Number:  [id number]   Referring MD:  Hillis RangeJames [name]  Reason for Referral:  stroke  HPI: [id number] year pleasant Caucasian lady who is accompanied today by her caregiver [name] provides most of the history. Patient had sudden onset of vision loss in the right eye several months ago. She is unable to provide me history as to when it happened and I do not have any records to review for this. [facility] and was subsequently referred to Dr. Luciana Axeankin and was diagnosed with retinal artery occlusion. She lost complete vision in the right eye but over the last few months has noticed some partial improvement in the temporal aspect of the right eye when she sees blurred images but is yet not able to read. She also has limited vision in the left eye which is long-standing. She started on aspirin which is tolerating well without bleeding, bruising or other side effects. She had lipid profile check her primary physician a few months ago which was okay. She had carotid ulltrasound done on 06/27/13 which shows mild plaques  without significant stenosis. Transthoracic echo done on 06/27/13 also shows normal ejection fraction. She has no prior history of strokes or TIAs or significant neurological problems. She denies history of headaches, scalp tenderness, mildly us. She has no significant vascular risk factors except for age. She quit smoking 30 years ago. She is still living independently at home she has a caregiver. Her family checks on her frequently and lives next door  ROS:   14 system review of systems is positive for ringing in the ears, loss of vision, urinary incontinence, anxiety and all the systems negative  PMH:  Past Medical History  Diagnosis Date  . Complete heart block     s/p PPM by Dr Amil AmenEdmunds 2004, recent  Gen Change  07/13/10  . Small bowel obstruction   . Depression   . Hypertension   . Hypokalemia   . Recurrent UTI   . Gout   . GERD (gastroesophageal reflux disease)   . DVT (deep venous thrombosis)     Social History:  History   Social History  . Marital Status: Widowed    Spouse Name: N/A    Number of Children: 1  . Years of Education: 11th    Occupational History  .     Social History Main Topics  . Smoking status: Former Smoker    Quit date: 07/24/1980  . Smokeless tobacco: Not on file  . Alcohol Use: No  . Drug Use: No  . Sexual Activity: Not on file   Other Topics Concern  . Not on file   Social History Narrative  . No narrative on file    Medications:   Current Outpatient Prescriptions on File Prior to Visit  Medication Sig Dispense Refill  . acetaminophen (TYLENOL) 325 MG tablet Take 650 mg by mouth every 4 (four) hours as needed. For pain.       Marland Kitchen. allopurinol (ZYLOPRIM) 100 MG tablet Take 100 mg by mouth daily.        Marland Kitchen. ALPRAZolam (XANAX) 0.5 MG tablet Take 0.25 mg by mouth at bedtime.        Marland Kitchen. aspirin EC 81 MG tablet Take 1 tablet (81 mg total) by mouth daily.  90 tablet  3  .  citalopram (CELEXA) 10 MG tablet Take 10 mg by mouth daily.        Marland Kitchen diltiazem (DILACOR XR) 180 MG 24 hr capsule Take 180 mg by mouth daily.        Marland Kitchen ENSURE (ENSURE) Take 237 mLs by mouth 2 (two) times daily.        . folic acid (FOLVITE) 1 MG tablet Take 1 mg by mouth daily.        . Oxybutynin Chloride (GELNIQUE TD) Place 1 packet onto the skin daily.       . pantoprazole (PROTONIX) 40 MG tablet Take 40 mg by mouth daily.        Marland Kitchen levothyroxine (SYNTHROID) 112 MCG tablet Take 1 tablet (112 mcg total) by mouth daily.  30 tablet  11  . [DISCONTINUED] POLYSACCHARIDE IRON (IRON POLYSACCHARIDES) 150 MG CAPS Take 150 mg by mouth 2 (two) times daily.       . [DISCONTINUED] warfarin (COUMADIN) 2 MG tablet Take 1.5 tablets (3 mg total) by mouth daily at 6 PM.  45 tablet  0   No current  facility-administered medications on file prior to visit.    Allergies:   Allergies  Allergen Reactions  . Aspirin   . Penicillins Rash    Physical Exam General: well developed, well nourished, seated, in no evident distress Head: head normocephalic and atraumatic. Orohparynx benign Neck: supple with no carotid or supraclavicular bruits Cardiovascular: regular rate and rhythm, no murmurs Musculoskeletal: no deformity Skin:  no rash/petichiae. Pacemaker infraclavicular Vascular:  Normal pulses all extremities There were no vitals filed for this visit.  Neurologic Exam Mental Status: Awake and fully alert. Oriented to place and time. Recent and remote memory diminished. Attention span, concentration and fund of knowledge appropriate. Mood and affect appropriate.  Cranial Nerves: Fundoscopic exam difficult to visaulaize . Pupils equal, briskly reactive to light. Extraocular movements full without nystagmus. Near blindness and right high with only slight blurred vision temporal aspect of right side. Limited vision in left eye with finger counting it to 10 feet Visual fields unable to test.. Hearing severely diminished bilaterally. Facial sensation intact. Face, tongue, palate moves normally and symmetrically.  Motor: Normal bulk and tone. Normal strength in all tested extremity muscles.mild intermittent resting tremor left hand. No cogwheeling, bradykinesia or festination Sensory.: intact to touch and pinprick and vibratory sensation.  Coordination: Rapid alternating movements normal in all extremities. Finger-to-nose and heel-to-shin performed accurately bilaterally. Gait and Station: Arises from chair with some difficulty. Stance is stooped. Mild gait initiation apraxia with short steps Gait demonstrates mild imbalance . Not able to heel, toe and tandem walk without difficulty. Uses a wheeled walker Reflexes: 1+ and symmetric. Toes downgoing.   NIHSS  0 Modified Rankin   1   ASSESSMENT: 4 year pleasant Caucasian lady with right eye vision loss secondary to central retinal artery occlusion in 2014 with no significant vascular risk factors except age and sex. History exam is not suggestive of temporal arteritis.    PLAN: I had a long discussion with the patient and caregiver regarding central retinal artery occlusion and her stroke risk factors and answered questions. Continue aspirin 81 mg daily for stroke prevention and maintain strict control of hypertension with blood pressure goal below 130/90. Check yearly carotid Dopplers. Return for followup in 3 months Larita Fife, NP or call earlier if necessary    Note: This document was prepared with digital dictation and possible smart phrase technology. Any transcriptional errors that result from this process  are unintentional.

## 2013-10-09 ENCOUNTER — Encounter: Payer: Self-pay | Admitting: *Deleted

## 2013-10-15 ENCOUNTER — Inpatient Hospital Stay (HOSPITAL_COMMUNITY): Payer: Medicare Other

## 2013-10-15 ENCOUNTER — Inpatient Hospital Stay (HOSPITAL_COMMUNITY)
Admission: EM | Admit: 2013-10-15 | Discharge: 2013-10-20 | DRG: 871 | Disposition: A | Discharge status: Skilled Nursing Facility | Payer: Medicare Other | Attending: Internal Medicine | Admitting: Internal Medicine

## 2013-10-15 ENCOUNTER — Emergency Department (HOSPITAL_COMMUNITY): Payer: Medicare Other

## 2013-10-15 ENCOUNTER — Encounter (HOSPITAL_COMMUNITY): Payer: Self-pay

## 2013-10-15 DIAGNOSIS — I214 Non-ST elevation (NSTEMI) myocardial infarction: Secondary | ICD-10-CM | POA: Diagnosis not present

## 2013-10-15 DIAGNOSIS — F3289 Other specified depressive episodes: Secondary | ICD-10-CM | POA: Diagnosis present

## 2013-10-15 DIAGNOSIS — I10 Essential (primary) hypertension: Secondary | ICD-10-CM

## 2013-10-15 DIAGNOSIS — F329 Major depressive disorder, single episode, unspecified: Secondary | ICD-10-CM | POA: Diagnosis present

## 2013-10-15 DIAGNOSIS — R079 Chest pain, unspecified: Secondary | ICD-10-CM

## 2013-10-15 DIAGNOSIS — E872 Acidosis, unspecified: Secondary | ICD-10-CM | POA: Diagnosis present

## 2013-10-15 DIAGNOSIS — Z823 Family history of stroke: Secondary | ICD-10-CM

## 2013-10-15 DIAGNOSIS — K831 Obstruction of bile duct: Secondary | ICD-10-CM | POA: Diagnosis present

## 2013-10-15 DIAGNOSIS — I82409 Acute embolism and thrombosis of unspecified deep veins of unspecified lower extremity: Secondary | ICD-10-CM

## 2013-10-15 DIAGNOSIS — D696 Thrombocytopenia, unspecified: Secondary | ICD-10-CM | POA: Diagnosis present

## 2013-10-15 DIAGNOSIS — H3410 Central retinal artery occlusion, unspecified eye: Secondary | ICD-10-CM

## 2013-10-15 DIAGNOSIS — D649 Anemia, unspecified: Secondary | ICD-10-CM

## 2013-10-15 DIAGNOSIS — Z880 Allergy status to penicillin: Secondary | ICD-10-CM

## 2013-10-15 DIAGNOSIS — R6521 Severe sepsis with septic shock: Secondary | ICD-10-CM

## 2013-10-15 DIAGNOSIS — E039 Hypothyroidism, unspecified: Secondary | ICD-10-CM

## 2013-10-15 DIAGNOSIS — A4150 Gram-negative sepsis, unspecified: Principal | ICD-10-CM | POA: Diagnosis present

## 2013-10-15 DIAGNOSIS — E876 Hypokalemia: Secondary | ICD-10-CM

## 2013-10-15 DIAGNOSIS — S76019A Strain of muscle, fascia and tendon of unspecified hip, initial encounter: Secondary | ICD-10-CM

## 2013-10-15 DIAGNOSIS — Z87891 Personal history of nicotine dependence: Secondary | ICD-10-CM

## 2013-10-15 DIAGNOSIS — F32A Depression, unspecified: Secondary | ICD-10-CM

## 2013-10-15 DIAGNOSIS — I4430 Unspecified atrioventricular block: Secondary | ICD-10-CM

## 2013-10-15 DIAGNOSIS — H5440 Blindness, one eye, unspecified eye: Secondary | ICD-10-CM | POA: Diagnosis present

## 2013-10-15 DIAGNOSIS — N179 Acute kidney failure, unspecified: Secondary | ICD-10-CM | POA: Diagnosis present

## 2013-10-15 DIAGNOSIS — M109 Gout, unspecified: Secondary | ICD-10-CM

## 2013-10-15 DIAGNOSIS — R6520 Severe sepsis without septic shock: Secondary | ICD-10-CM

## 2013-10-15 DIAGNOSIS — Z950 Presence of cardiac pacemaker: Secondary | ICD-10-CM

## 2013-10-15 DIAGNOSIS — K560 Paralytic ileus: Secondary | ICD-10-CM | POA: Diagnosis present

## 2013-10-15 DIAGNOSIS — Z86718 Personal history of other venous thrombosis and embolism: Secondary | ICD-10-CM

## 2013-10-15 DIAGNOSIS — A419 Sepsis, unspecified organism: Secondary | ICD-10-CM

## 2013-10-15 DIAGNOSIS — K8309 Other cholangitis: Secondary | ICD-10-CM | POA: Diagnosis present

## 2013-10-15 DIAGNOSIS — I495 Sick sinus syndrome: Secondary | ICD-10-CM

## 2013-10-15 DIAGNOSIS — R269 Unspecified abnormalities of gait and mobility: Secondary | ICD-10-CM

## 2013-10-15 DIAGNOSIS — K219 Gastro-esophageal reflux disease without esophagitis: Secondary | ICD-10-CM

## 2013-10-15 DIAGNOSIS — K56609 Unspecified intestinal obstruction, unspecified as to partial versus complete obstruction: Secondary | ICD-10-CM

## 2013-10-15 DIAGNOSIS — S72002A Fracture of unspecified part of neck of left femur, initial encounter for closed fracture: Secondary | ICD-10-CM

## 2013-10-15 DIAGNOSIS — I442 Atrioventricular block, complete: Secondary | ICD-10-CM | POA: Diagnosis present

## 2013-10-15 DIAGNOSIS — K869 Disease of pancreas, unspecified: Secondary | ICD-10-CM | POA: Diagnosis present

## 2013-10-15 DIAGNOSIS — R410 Disorientation, unspecified: Secondary | ICD-10-CM

## 2013-10-15 DIAGNOSIS — Z79899 Other long term (current) drug therapy: Secondary | ICD-10-CM

## 2013-10-15 DIAGNOSIS — Z66 Do not resuscitate: Secondary | ICD-10-CM | POA: Diagnosis not present

## 2013-10-15 DIAGNOSIS — N390 Urinary tract infection, site not specified: Secondary | ICD-10-CM

## 2013-10-15 DIAGNOSIS — S7290XA Unspecified fracture of unspecified femur, initial encounter for closed fracture: Secondary | ICD-10-CM

## 2013-10-15 LAB — URINALYSIS, ROUTINE W REFLEX MICROSCOPIC
GLUCOSE, UA: NEGATIVE mg/dL
KETONES UR: 15 mg/dL — AB
Nitrite: POSITIVE — AB
PROTEIN: 30 mg/dL — AB
Specific Gravity, Urine: 1.021 (ref 1.005–1.030)
Urobilinogen, UA: 1 mg/dL (ref 0.0–1.0)
pH: 5.5 (ref 5.0–8.0)

## 2013-10-15 LAB — COMPREHENSIVE METABOLIC PANEL
ALK PHOS: 144 U/L — AB (ref 39–117)
ALT: 77 U/L — ABNORMAL HIGH (ref 0–35)
AST: 78 U/L — AB (ref 0–37)
Albumin: 2.9 g/dL — ABNORMAL LOW (ref 3.5–5.2)
BILIRUBIN TOTAL: 4.1 mg/dL — AB (ref 0.3–1.2)
BUN: 36 mg/dL — ABNORMAL HIGH (ref 6–23)
CHLORIDE: 94 meq/L — AB (ref 96–112)
CO2: 14 mEq/L — ABNORMAL LOW (ref 19–32)
CREATININE: 1.63 mg/dL — AB (ref 0.50–1.10)
Calcium: 8.6 mg/dL (ref 8.4–10.5)
GFR calc Af Amer: 30 mL/min — ABNORMAL LOW (ref >90)
GFR, EST NON AFRICAN AMERICAN: 26 mL/min — AB (ref >90)
Glucose, Bld: 79 mg/dL (ref 70–99)
POTASSIUM: 3 meq/L — AB (ref 3.7–5.3)
Sodium: 135 mEq/L — ABNORMAL LOW (ref 137–147)
Total Protein: 5.7 g/dL — ABNORMAL LOW (ref 6.0–8.3)

## 2013-10-15 LAB — URINE MICROSCOPIC-ADD ON

## 2013-10-15 LAB — CBC WITH DIFFERENTIAL/PLATELET
BASOS PCT: 0 % (ref 0–1)
Basophils Absolute: 0 10*3/uL (ref 0.0–0.1)
Eosinophils Absolute: 0 10*3/uL (ref 0.0–0.7)
Eosinophils Relative: 0 % (ref 0–5)
HCT: 36.3 % (ref 36.0–46.0)
HEMOGLOBIN: 12.3 g/dL (ref 12.0–15.0)
Lymphocytes Relative: 4 % — ABNORMAL LOW (ref 12–46)
Lymphs Abs: 0.7 10*3/uL (ref 0.7–4.0)
MCH: 29.3 pg (ref 26.0–34.0)
MCHC: 33.9 g/dL (ref 30.0–36.0)
MCV: 86.4 fL (ref 78.0–100.0)
MONO ABS: 0.7 10*3/uL (ref 0.1–1.0)
Monocytes Relative: 4 % (ref 3–12)
Neutro Abs: 17.3 10*3/uL — ABNORMAL HIGH (ref 1.7–7.7)
Neutrophils Relative %: 92 % — ABNORMAL HIGH (ref 43–77)
Platelets: 50 10*3/uL — ABNORMAL LOW (ref 150–400)
RBC: 4.2 MIL/uL (ref 3.87–5.11)
RDW: 13.8 % (ref 11.5–15.5)
WBC: 18.7 10*3/uL — ABNORMAL HIGH (ref 4.0–10.5)

## 2013-10-15 LAB — PROCALCITONIN: Procalcitonin: 33.72 ng/mL

## 2013-10-15 LAB — I-STAT CG4 LACTIC ACID, ED: Lactic Acid, Venous: 10.1 mmol/L — ABNORMAL HIGH (ref 0.5–2.2)

## 2013-10-15 LAB — OSMOLALITY: OSMOLALITY: 291 mosm/kg (ref 275–300)

## 2013-10-15 LAB — MRSA PCR SCREENING: MRSA by PCR: NEGATIVE

## 2013-10-15 MED ORDER — VANCOMYCIN HCL IN DEXTROSE 1-5 GM/200ML-% IV SOLN
1000.0000 mg | INTRAVENOUS | Status: DC
  Filled 2013-10-15: qty 200

## 2013-10-15 MED ORDER — LEVOTHYROXINE SODIUM 100 MCG IV SOLR
50.0000 ug | Freq: Every day | INTRAVENOUS | Status: DC
  Administered 2013-10-17 – 2013-10-19 (×3): 50 ug via INTRAVENOUS
  Filled 2013-10-15 (×4): qty 5

## 2013-10-15 MED ORDER — LORAZEPAM 2 MG/ML IJ SOLN
0.5000 mg | Freq: Once | INTRAMUSCULAR | Status: AC
  Administered 2013-10-15: 0.5 mg via INTRAVENOUS
  Filled 2013-10-15: qty 1

## 2013-10-15 MED ORDER — POTASSIUM CHLORIDE 10 MEQ/100ML IV SOLN
10.0000 meq | INTRAVENOUS | Status: AC
  Administered 2013-10-15 – 2013-10-16 (×4): 10 meq via INTRAVENOUS
  Filled 2013-10-15 (×4): qty 100

## 2013-10-15 MED ORDER — SACCHAROMYCES BOULARDII 250 MG PO CAPS
250.0000 mg | ORAL_CAPSULE | Freq: Two times a day (BID) | ORAL | Status: DC
  Filled 2013-10-15 (×5): qty 1

## 2013-10-15 MED ORDER — PANTOPRAZOLE SODIUM 40 MG IV SOLR
40.0000 mg | Freq: Every day | INTRAVENOUS | Status: DC
  Administered 2013-10-15: 40 mg via INTRAVENOUS
  Filled 2013-10-15 (×3): qty 40

## 2013-10-15 MED ORDER — ASPIRIN 300 MG RE SUPP
150.0000 mg | Freq: Every day | RECTAL | Status: DC
  Administered 2013-10-15: 150 mg via RECTAL
  Filled 2013-10-15 (×2): qty 1

## 2013-10-15 MED ORDER — GUAIFENESIN-DM 100-10 MG/5ML PO SYRP: 5.0000 mL | ORAL_SOLUTION | ORAL | Status: DC | PRN

## 2013-10-15 MED ORDER — DEXTROSE 5 % IV SOLN: 1.0000 g | INTRAVENOUS | Status: DC

## 2013-10-15 MED ORDER — MORPHINE SULFATE 2 MG/ML IJ SOLN: 1.0000 mg | INTRAMUSCULAR | Status: DC | PRN

## 2013-10-15 MED ORDER — NOREPINEPHRINE BITARTRATE 1 MG/ML IJ SOLN: 2.0000 ug/min | INTRAVENOUS | Status: DC

## 2013-10-15 MED ORDER — PHENYLEPHRINE HCL 10 MG/ML IJ SOLN
30.0000 ug/min | INTRAVENOUS | Status: DC
  Administered 2013-10-15: 30 ug/min via INTRAVENOUS
  Filled 2013-10-15 (×2): qty 1

## 2013-10-15 MED ORDER — VANCOMYCIN HCL IN DEXTROSE 1-5 GM/200ML-% IV SOLN
1000.0000 mg | Freq: Once | INTRAVENOUS | Status: AC
  Administered 2013-10-15: 1000 mg via INTRAVENOUS
  Filled 2013-10-15: qty 200

## 2013-10-15 MED ORDER — HYDROCODONE-ACETAMINOPHEN 5-325 MG PO TABS: 1.0000 | ORAL_TABLET | ORAL | Status: DC | PRN

## 2013-10-15 MED ORDER — ONDANSETRON HCL 4 MG/2ML IJ SOLN: 4.0000 mg | Freq: Four times a day (QID) | INTRAMUSCULAR | Status: DC | PRN

## 2013-10-15 MED ORDER — SODIUM CHLORIDE 0.9 % IV SOLN
500.0000 mg | Freq: Two times a day (BID) | INTRAVENOUS | Status: DC
  Administered 2013-10-15 – 2013-10-18 (×5): 500 mg via INTRAVENOUS
  Filled 2013-10-15 (×9): qty 0.5

## 2013-10-15 MED ORDER — PANTOPRAZOLE SODIUM 40 MG IV SOLR: 40.0000 mg | Freq: Two times a day (BID) | INTRAVENOUS | Status: DC

## 2013-10-15 MED ORDER — SODIUM CHLORIDE 0.9 % IV SOLN
1000.0000 mL | Freq: Once | INTRAVENOUS | Status: AC
  Administered 2013-10-15: 1000 mL via INTRAVENOUS

## 2013-10-15 MED ORDER — DEXTROSE 5 % IV SOLN
2.0000 g | INTRAVENOUS | Status: AC
  Administered 2013-10-15: 2 g via INTRAVENOUS

## 2013-10-15 MED ORDER — POTASSIUM CHLORIDE IN NACL 20-0.9 MEQ/L-% IV SOLN
INTRAVENOUS | Status: DC
  Administered 2013-10-15 – 2013-10-16 (×2): via INTRAVENOUS
  Filled 2013-10-15 (×3): qty 1000

## 2013-10-15 MED ORDER — POLYETHYLENE GLYCOL 3350 17 G PO PACK
17.0000 g | PACK | Freq: Every day | ORAL | Status: DC | PRN
  Filled 2013-10-15: qty 1

## 2013-10-15 MED ORDER — SODIUM CHLORIDE 0.9 % IV SOLN: 1000.0000 mL | INTRAVENOUS | Status: DC

## 2013-10-15 MED ORDER — IOHEXOL 300 MG/ML  SOLN
50.0000 mL | Freq: Once | INTRAMUSCULAR | Status: AC | PRN
  Administered 2013-10-15: 50 mL via ORAL

## 2013-10-15 MED ORDER — ACETAMINOPHEN 500 MG PO TABS: 1000.0000 mg | ORAL_TABLET | Freq: Once | ORAL | Status: DC

## 2013-10-15 MED ORDER — SODIUM CHLORIDE 0.9 % IV SOLN
1000.0000 mL | INTRAVENOUS | Status: DC
  Administered 2013-10-15: 1000 mL via INTRAVENOUS

## 2013-10-15 MED ORDER — POTASSIUM CHLORIDE 20 MEQ/15ML (10%) PO LIQD
40.0000 meq | Freq: Two times a day (BID) | ORAL | Status: DC
  Administered 2013-10-15: 40 meq via ORAL
  Filled 2013-10-15: qty 30

## 2013-10-15 MED ORDER — SODIUM CHLORIDE 0.9 % IJ SOLN
3.0000 mL | Freq: Two times a day (BID) | INTRAMUSCULAR | Status: DC
  Administered 2013-10-18 – 2013-10-20 (×3): 3 mL via INTRAVENOUS

## 2013-10-15 MED ORDER — SODIUM CHLORIDE 0.9 % IV BOLUS (SEPSIS)
1000.0000 mL | Freq: Once | INTRAVENOUS | Status: AC
  Administered 2013-10-15: 1000 mL via INTRAVENOUS

## 2013-10-15 MED ORDER — ONDANSETRON HCL 4 MG PO TABS: 4.0000 mg | ORAL_TABLET | Freq: Four times a day (QID) | ORAL | Status: DC | PRN

## 2013-10-15 MED ORDER — PANTOPRAZOLE SODIUM 40 MG PO TBEC: 40.0000 mg | DELAYED_RELEASE_TABLET | Freq: Two times a day (BID) | ORAL | Status: DC

## 2013-10-15 MED ORDER — SODIUM CHLORIDE 0.9 % IV BOLUS (SEPSIS): 1000.0000 mL | INTRAVENOUS | Status: DC | PRN

## 2013-10-15 NOTE — ED Notes (Signed)
Bed: ZO10WA19 Expected date:  Expected time:  Means of arrival:  Comments: Old person, weak, fever

## 2013-10-15 NOTE — Progress Notes (Signed)
ANTIBIOTIC CONSULT NOTE - INITIAL  Pharmacy Consult for meropenem Indication: rule out pneumonia and abdominal infection  Allergies  Allergen Reactions  . Aspirin   . Penicillins Rash    Patient Measurements: Height: 5\' 5"  (165.1 cm) Weight: 151 lb (68.493 kg) IBW/kg (Calculated) : 57  Vital Signs: Temp: 101.2 F (38.4 C) (03/25 1123) Temp src: Rectal (03/25 1123) BP: 102/44 mmHg (03/25 1340) Pulse Rate: 93 (03/25 1340) Intake/Output from previous day:   Intake/Output from this shift:    Labs:  Recent Labs  10/15/13 1132  WBC 18.7*  HGB 12.3  PLT 50*  CREATININE 1.63*   Estimated Creatinine Clearance: 20.1 ml/min (by C-G formula based on Cr of 1.63). No results found for this basename: VANCOTROUGH, VANCOPEAK, VANCORANDOM, GENTTROUGH, GENTPEAK, GENTRANDOM, TOBRATROUGH, TOBRAPEAK, TOBRARND, AMIKACINPEAK, AMIKACINTROU, AMIKACIN,  in the last 72 hours   Microbiology: No results found for this or any previous visit (from the past 720 hour(s)).  Medical History: Past Medical History  Diagnosis Date  . Complete heart block     s/p PPM by Dr Amil AmenEdmunds 2004, recent Gen Change  07/13/10  . Small bowel obstruction   . Depression   . Hypertension   . Hypokalemia   . Recurrent UTI   . Gout   . GERD (gastroesophageal reflux disease)   . DVT (deep venous thrombosis)     Medications:  Scheduled:  . [START ON 10/16/2013] ceFEPime (MAXIPIME) IV  1 g Intravenous Q24H  . potassium chloride  40 mEq Oral BID  . [START ON 10/17/2013] vancomycin  1,000 mg Intravenous Q48H   Infusions:  . sodium chloride     Assessment: 80 yo already known to pharmacy for vanc and cefepime dosing earlier. To switch cefepime to meropenem for possible intra-abdominal infection.   Goal of Therapy:  eradication of infection  Plan:  Meropenem 500mg  IV q12 per current CrCl 10-25 ml/min   Hessie KnowsJustin M Jessicalynn Deshong, PharmD, BCPS Pager [phone] 10/15/2013 2:03 PM

## 2013-10-15 NOTE — Evaluation (Signed)
SLP Cancellation Note  Patient Details Name: [name] MRN: [id number] DOB: [birth year]   Cancelled treatment:       Reason Eval/Treat Not Completed:  (pt NPO for abdominal ultrasound, will return as schedule allows)   Mills KollerKimball, Arlis Yale Ann Tawni Melkonian, MS [facility] SLP [phone]

## 2013-10-15 NOTE — Progress Notes (Signed)
ANTIBIOTIC CONSULT NOTE - INITIAL  Pharmacy Consult for Vancomycin and Cefepime Indication: rule out sepsis  Allergies  Allergen Reactions  . Aspirin   . Penicillins Rash    Patient Measurements: Height: 5\' 5"  (165.1 cm) Weight: 151 lb (68.493 kg) IBW/kg (Calculated) : 57  Vital Signs: Temp: 101.2 F (38.4 C) (03/25 1123) Temp src: Rectal (03/25 1123) BP: 84/41 mmHg (03/25 1220) Pulse Rate: 90 (03/25 1220) Intake/Output from previous day:   Intake/Output from this shift:    Labs:  Recent Labs  10/15/13 1132  CREATININE 1.63*   Estimated Creatinine Clearance: 20.1 ml/min (by C-G formula based on Cr of 1.63). No results found for this basename: VANCOTROUGH, VANCOPEAK, VANCORANDOM, GENTTROUGH, GENTPEAK, GENTRANDOM, TOBRATROUGH, TOBRAPEAK, TOBRARND, AMIKACINPEAK, AMIKACINTROU, AMIKACIN,  in the last 72 hours   Microbiology: No results found for this or any previous visit (from the past 720 hour(s)).  Medical History: Past Medical History  Diagnosis Date  . Complete heart block     s/p PPM by Dr Amil AmenEdmunds 2004, recent Gen Change  07/13/10  . Small bowel obstruction   . Depression   . Hypertension   . Hypokalemia   . Recurrent UTI   . Gout   . GERD (gastroesophageal reflux disease)   . DVT (deep venous thrombosis)     Medications:  Scheduled:   Infusions:  . sodium chloride 1,000 mL (10/15/13 1147)  . vancomycin 1,000 mg (10/15/13 1154)   Assessment: 80 y.o. female presenting with fever and weakness x 1 week. Hypotensive in ED. Beginning broad spectrum antibiotics with vancomycin and cefepime per pharmacy dosing.   Goal of Therapy:  Vancomycin trough level 15-20 mcg/ml Cefepime dose per renal function  Plan:   Vancomycin 1g IV q48h, check trough at steady state  Cefepime 2g IV x 1 in ED, then 1g IV q24h Follow up renal function & cultures  Loralee PacasErin Langston [place], PharmD, BCPS Pager: [phone] 10/15/2013,12:40 PM

## 2013-10-15 NOTE — ED Notes (Signed)
Onalee HuaDavid Lore-grandson/night caregiver [phone]

## 2013-10-15 NOTE — Progress Notes (Signed)
eLink Physician-Brief Progress Note Patient Name: [name] DOB: 09-Aug-1916 MRN: [id number]  Date of Service  10/15/2013   HPI/Events of Note  Pt with septic shock d/t CBD stone and biliary sepsis  eICU Interventions  Plan peripheral neo drip.  Not sure CVL or aggressive care indicated.  When can free up ICU MD from [facility] will send over for assessment later.  tfr to ICU status   Intervention Category Major Interventions: Shock - evaluation and management;Sepsis - evaluation and management  Shan Levansatrick Margarete Horace 10/15/2013, 5:55 PM

## 2013-10-15 NOTE — Consult Note (Signed)
Referring Provider: Dr. Susa Raring Primary Care Physician:  [name], MD Primary Gastroenterologist:  None (unassigned)  Reason for Consultation:  Possible cholangitis  HPI: [name] is a 80 y.o. female admitted through the emergency room this afternoon with hypotension, having problems of fever, weakness, and upper abdominal and right flank pain prior to admission. The history is obtained from the referring physician, from the medical record, and to a limited degree from the patient who is very hard of hearing.  Evaluation in the emergency room showed a few white cells in the urine, many bacteria, and positive dipstick testing suggestive of a UTI. This was originally felt to be urosepsis.  However, further evaluation showed evidence of possible cholangitis, with elevated liver chemistries, including bilirubin 4.1, transaminases in the 70s, alkaline phosphatase 144. She had evidence of septic shock, characterized by white count of 18,700, platelets 50,000, elevated lactate level of 10.  An ultrasound showed a 2 cm common bile duct and a slightly nodular liver, no stones seen in the common duct, status post cholecystectomy. The patient indicates that her cholecystectomy was "many years ago" and examination of the abdomen shows a right infracostal scar consistent with an open cholecystectomy, so it was probably more than 25 years ago. A CT scan was then obtained which showed significant intrahepatic and extrahepatic biliary ductal dilatation all the way to the level of the ampulla, normal pancreas without evidence of mass, no findings to suggest cirrhosis or chronic liver disease such as splenomegaly or ascites.  On presentation, the patient had a systolic blood pressure as low as 77, but it responded to fluids and the patient is now on low-dose Neo-Synephrine, mentating well, perfusing well, and running blood pressure in the 110 systolic range with a pulse of 80.  She is not  complaining of any abdominal pain at this time and actually denies abdominal pain at time of presentation, in contrast to the history in the record.  The patient is on multiple antibiotics including meropenem and vancomycin.  Although the patient is a DO NOT RESUSCITATE/DO NOT INTUBATE patient, it sounds as though she lives quite independently in her own home with the help of a caregiver and thus is fairly highly functioning despite her advanced age.   Past Medical History  Diagnosis Date  . Complete heart block     s/p PPM by Dr Amil Amen 2004, recent Gen Change  07/13/10  . Small bowel obstruction   . Depression   . Hypertension   . Hypokalemia   . Recurrent UTI   . Gout   . GERD (gastroesophageal reflux disease)   . DVT (deep venous thrombosis)     Past Surgical History  Procedure Laterality Date  . Insert / replace / remove pacemaker      initial pacemaker by Dr Amil Amen 2004, replaced by [facility] for ERI 07/13/10  . Hip arthroplasty  07/02/2011    Procedure: ARTHROPLASTY BIPOLAR HIP;  Surgeon: Harvie Junior;  Location: [facility];  Service: Orthopedics;  Laterality: Left;    Prior to Admission medications   Medication Sig Start Date End Date Taking? Authorizing Provider  acetaminophen (TYLENOL) 325 MG tablet Take 650 mg by mouth every 4 (four) hours as needed. For pain.    Yes Historical Provider, MD  allopurinol (ZYLOPRIM) 100 MG tablet Take 100 mg by mouth daily.     Yes Historical Provider, MD  ALPRAZolam Prudy Feeler) 0.5 MG tablet Take 0.25 mg by mouth at bedtime.     Yes  Historical Provider, MD  aspirin EC 81 MG tablet Take 1 tablet (81 mg total) by mouth daily. 06/09/13  Yes Hillis Range, MD  beta carotene w/minerals (OCUVITE) tablet Take 1 tablet by mouth daily.   Yes Historical Provider, MD  citalopram (CELEXA) 10 MG tablet Take 10 mg by mouth daily.     Yes Historical Provider, MD  diltiazem (DILACOR XR) 180 MG 24 hr capsule Take 180 mg by mouth daily.     Yes Historical Provider, MD   ENSURE (ENSURE) Take 237 mLs by mouth 2 (two) times daily.     Yes Historical Provider, MD  folic acid (FOLVITE) 1 MG tablet Take 1 mg by mouth daily.     Yes Historical Provider, MD  levothyroxine (SYNTHROID, LEVOTHROID) 112 MCG tablet Take 112 mcg by mouth daily before breakfast.   Yes Historical Provider, MD  Oxybutynin Chloride (GELNIQUE TD) Place 1 packet onto the skin daily.    Yes Historical Provider, MD  pantoprazole (PROTONIX) 40 MG tablet Take 40 mg by mouth daily.     Yes Historical Provider, MD    Current Facility-Administered Medications  Medication Dose Route Frequency Provider Last Rate Last Dose  . 0.9 % NaCl with KCl 20 mEq/ L  infusion   Intravenous Continuous Leroy Sea, MD 125 mL/hr at 10/15/13 1900    . [START ON 10/16/2013] levothyroxine (SYNTHROID, LEVOTHROID) injection 50 mcg  50 mcg Intravenous Daily Leroy Sea, MD      . meropenem (MERREM) 500 mg in sodium chloride 0.9 % 50 mL IVPB  500 mg Intravenous Q12H Berkley Harvey, RPH   500 mg at 10/15/13 1801  . morphine 2 MG/ML injection 1 mg  1 mg Intravenous Q4H PRN Leroy Sea, MD      . ondansetron (ZOFRAN) injection 4 mg  4 mg Intravenous Q6H PRN Leroy Sea, MD      . pantoprazole (PROTONIX) injection 40 mg  40 mg Intravenous QHS Coralyn Helling, MD      . phenylephrine (NEO-SYNEPHRINE) 10 mg in dextrose 5 % 250 mL infusion  30-200 mcg/min Intravenous Continuous Storm Frisk, MD   10 mcg/min at 10/15/13 2048  . potassium chloride 10 mEq in 100 mL IVPB  10 mEq Intravenous Q1 Hr x 4 Coralyn Helling, MD   10 mEq at 10/15/13 2214  . sodium chloride 0.9 % injection 3 mL  3 mL Intravenous Q12H Leroy Sea, MD      . Melene Muller ON 10/17/2013] vancomycin (VANCOCIN) IVPB 1000 mg/200 mL premix  1,000 mg Intravenous Q48H Rollene Fare, RPH        Allergies as of 10/15/2013 - Review Complete 10/15/2013  Allergen Reaction Noted  . Aspirin  06/27/2010  . Penicillins Rash 06/27/2010    Family History   Problem Relation Age of Onset  . Stroke Father     History   Social History  . Marital Status: Widowed    Spouse Name: N/A    Number of Children: 1  . Years of Education: 11th    Occupational History  .     Social History Main Topics  . Smoking status: Former Smoker    Quit date: 07/24/1980  . Smokeless tobacco: Never Used  . Alcohol Use: No  . Drug Use: No  . Sexual Activity: No   Other Topics Concern  . Not on file   Social History Narrative  . No narrative on file    Review of Systems: See  history of present illness  Physical Exam: Vital signs in last 24 hours: Temp:  [98 F (36.7 C)-101.2 F (38.4 C)] 98 F (36.7 C) (03/25 2000) Pulse Rate:  [75-98] 79 (03/25 2145) Resp:  [23-41] 27 (03/25 2145) BP: (75-124)/(35-91) 107/65 mmHg (03/25 2145) SpO2:  [91 %-98 %] 93 % (03/25 2145) Weight:  [68.493 kg (151 lb)] 68.493 kg (151 lb) (03/25 1232) Last BM Date: 10/15/13 General:   Alert,  Well-developed, well-nourished, looks younger than her age, very hard of hearing, lying in bed in absolutely no distress. Skin is warm and dry. No scleral icterus despite elevated bilirubin. Chest clear to auscultation, no wheezes. Heart rhythm regular, no murmur. Abdomen is completely nontender with particular reference to the right upper quadrant. Bowel sounds were quiet. No rigidity, no guarding or mass effect. The abdomen is somewhat distended but there is flank tympany suggesting the absence of ascites, and in fact there is diffuse abdominal tympany suggestive of an intestinal ileus. No stigmata of chronic liver disease such as palmar erythema or spider angiomata or hepatosplenomegaly, no peripheral edema.  Intake/Output from previous day:   Intake/Output this shift: Total I/O In: 317.8 [I.[name].:317.8] Out: -   Lab Results:  Recent Labs  10/15/13 1132  WBC 18.7*  HGB 12.3  HCT 36.3  PLT 50*   BMET  Recent Labs  10/15/13 1132  NA 135*  K 3.0*  CL 94*  CO2 14*   GLUCOSE 79  BUN 36*  CREATININE 1.63*  CALCIUM 8.6   LFT  Recent Labs  10/15/13 1132  PROT 5.7*  ALBUMIN 2.9*  AST 78*  ALT 77*  ALKPHOS 144*  BILITOT 4.1*   PT/INR No results found for this basename: LABPROT, INR,  in the last 72 hours   Studies/Results: Ct Abdomen Pelvis Wo Contrast  10/15/2013   CLINICAL DATA:  Fever.  EXAM: CT ABDOMEN AND PELVIS WITHOUT CONTRAST  TECHNIQUE: Multidetector CT imaging of the abdomen and pelvis was performed following the standard protocol without intravenous contrast.  COMPARISON:  DG PELVIS PORTABLE dated 07/02/2011; CT L SPINE W/O CM dated 03/02/2010  FINDINGS: Prior cholecystectomy. Severe dilatation of the intrahepatic and extrahepatic biliary ducts. The common bile duct measures 2.2 cm in diameter. Common bile duct is distended to the level of the ampulla of Vater/ pancreatic head. A pancreatic head mass or ampullary mass cannot be excluded. ERCP should be considered for further evaluation. Punctate calcifications in this region may be related to ductal calcification and/or vascular calcifications. No definite calcified stone is noted in the distal common bile duct. Liver difficult to evaluate without contrast. Lucencies noted most likely distended biliary ducts. Spleen normal. Pancreas normal.  No focal adrenal abnormality identified. 3.1 cm simple right renal cyst. 1.5 cm left renal cyst. No hydronephrosis or obstructing ureteral stone. The bladder is nondistended. Calcified uterine fibroids. Adnexa unremarkable. No free pelvic fluid.  No adenopathy. 4.1 x 4.1 infrarenal suprailiac abdominal aortic aneurysm is present. Ectasia of both common iliac arteries to 15 mm noted.  Prior right colonic surgery. Appendix is not definitely identified. Stool is present in the colon. Mild distention of small and large bowel noted. These findings suggest adynamic ileus. Follow-up abdominal series suggested. No site of bowel obstruction noted. Stool is present in the  colon. Stomach is minimally distended. Hiatal hernia is present. No free air.  Cardiomegaly. Coronary artery disease. Cardiac pacer. Atelectasis in the lung bases. Small pleural effusions. Left hip prosthesis. Slight deformity right femoral neck noted. This may  be from prior trauma. No acute fracture line is identified. If the patient has right hip symptoms, right hip series followup suggested .  Marland Kitchen.  1. Severe intrahepatic and extrahepatic biliary ductal dilatation with the common bile duct measuring 2.2 cm. The common bile duct is distended to the level of the ampulla /pancreatic head. A pancreatic head mass/malignancy cannot be excluded. ERCP is suggested for further evaluation. 2. 4.1 x 4.1 infrarenal, suprailiac abdominal aortic aneurysm with bilateral common iliac artery ectasia. 3. Adynamic ileus. 4. Cardiomegaly.  Coronary artery disease. 5. Slight deformity right femoral neck, this may be related to degenerative change and/or old trauma. No definite fracture identified. If patient is symptomatic in this region follow-up right hip series is suggested .   Electronically Signed   By: Maisie Fushomas  Register   On: 10/15/2013 17:15   Dg Chest Port 1 View  10/15/2013   CLINICAL DATA:  Fever and weakness  EXAM: PORTABLE CHEST - 1 VIEW  COMPARISON:  DG CHEST 1 VIEW dated 07/01/2011  FINDINGS: Low lung volumes with crowding of the central pulmonary markings. There is no focal parenchymal opacity, pleural effusion, or pneumothorax. The heart and mediastinal contours are unremarkable. There is a dual lead cardiac pacer in satisfactory position.Stable cardiomediastinal silhouette. Mild cardiomegaly.  IMPRESSION: No active disease.   Electronically Signed   By: Elige KoHetal  Patel   On: 10/15/2013 12:50   [place] Abdomen Limited Ruq  10/15/2013   CLINICAL DATA:  Sepsis, elevated LFTs  EXAM: US ABDOMEN LIMITED - RIGHT UPPER QUADRANT  COMPARISON:  None.  FINDINGS: Gallbladder:  Status post cholecystectomy.  Common bile duct:  Diameter:  20 mm. This is within normal limits given the post cholecystectomy state. It does taper distally into the region of the pancreatic head.  Liver:  Mild nodularity is noted which may be related to some underlying cirrhotic change.  IMPRESSION: Mild nodularity of the liver.  Status post cholecystectomy.  No acute abnormality is noted.   Electronically Signed   By: Alcide CleverMark  Lukens M.D.   On: 10/15/2013 16:43    Impression: 1. Septic shock, origin not clear but suspect biliary given elevated liver chemistries and antecedent right upper quadrant discomfort. However, the patient's transaminases are not as high as would typically be seen in the setting of cholangitis.  2. Elevated liver chemistries, possibly due to "reactive hepatopathy" as opposed to cholangitis or biliary obstruction  3. Severely dilated common bile duct with intra-and extrahepatic biliary ductal dilatation, without obvious pancreatic mass or intraductal stones (with a 2 cm diameter bile duct, one might suspect that an intraluminal filling defect, such as a stone, would have been detectable on her ultrasound, but none was seen). Since it has been many years since she had her cholecystectomy, it is possible that this is simply physiologic biliary tract dilatation, although typically that is confined to the extrahepatic biliary tree. She could have an ampullary tumor, papillary stenosis, a pancreatic mass that was missed on her CT scan, or an intraductal stone.  Plan: Monitor labs and clinical evolution overnight on antibiotic therapy. By the morning, it should be somewhat clearer what her clinical status is, and we might have a clearer idea as to the etiology of her elevated liver chemistries. My best guess is that she has chronic biliary dilatation, which has now become infected.  The main management question from the GI tract standpoint is whether this [age] year old, but otherwise reasonably healthy patient, should undergo an ERCP and stent  placement for biliary drainage.  If she improves sufficiently on antibiotic therapy, it might be possible to avoid that intervention, and perhaps just do an EUS to exclude ampullary or pancreatic tumor and intraductal stones.     LOS: 0 days   [name]  10/15/2013, 10:17 PM

## 2013-10-15 NOTE — ED Notes (Signed)
Dr. Walden at bedside 

## 2013-10-15 NOTE — H&P (Addendum)
Patient Demographics  [name], is a 80 y.o. female    WGN:[id number]   DOB - April 24, 1917  Admit date - 10/15/2013  Admitting Physician No admitting provider for patient encounter.  Outpatient Primary MD for the patient is [name], MD  0   With History of -  Past Medical History  Diagnosis Date  . Complete heart block     s/p PPM by Dr Amil Amen 2004, recent Gen Change  07/13/10  . Small bowel obstruction   . Depression   . Hypertension   . Hypokalemia   . Recurrent UTI   . Gout   . GERD (gastroesophageal reflux disease)   . DVT (deep venous thrombosis)       Past Surgical History  Procedure Laterality Date  . Insert / replace / remove pacemaker      initial pacemaker by Dr Amil Amen 2004, replaced by [facility] for ERI 07/13/10  . Hip arthroplasty  07/02/2011    Procedure: ARTHROPLASTY BIPOLAR HIP;  Surgeon: Harvie Junior;  Location: [facility];  Service: Orthopedics;  Laterality: Left;    in for   Chief Complaint  Patient presents with  . Fever  . Weakness     HPI  [name] is a 80 y.o. female, with history of complete heart block status post pacemaker placement, right sided blindness due to central retinal artery occlusion, gout, hypertension, and GERD, DVT in the past who lives at home with family living nearby and a caregiver at home brought in by family members with chief complaints of fever and generalized weakness, patient complains of some mild right-sided back/flank pain, denies any headache, no chest pain, no diarrhea or dysuria, no focal weakness. In the ER workup suggestive of septic shock with UTI, I was called to admit the patient.    Review of Systems    In addition to the HPI above,   No Fever-chills, No Headache, No changes with Vision or hearing, No problems swallowing food or Liquids, No Chest pain, Cough or Shortness of Breath, No Abdominal pain, No Nausea  or Vommitting, Bowel movements are regular, No Blood in stool or Urine, No dysuria, No new skin rashes or bruises, No new joints pains-aches,  No new weakness, tingling, numbness in any extremity, No recent weight gain or loss, No polyuria, polydypsia or polyphagia, No significant Mental Stressors.  A full 10 point Review of Systems was done, except as stated above, all other Review of Systems were negative.   Social History History  Substance Use Topics  . Smoking status: Former Smoker    Quit date: 07/24/1980  . Smokeless tobacco: Not on file  . Alcohol Use: No      Family History Family History  Problem Relation Age of Onset  . Stroke Father       Prior to Admission medications   Medication Sig Start Date End Date Taking? Authorizing Provider  acetaminophen (TYLENOL) 325 MG tablet Take 650 mg by mouth every 4 (four) hours as needed. For pain.     Historical Provider, MD  allopurinol (ZYLOPRIM) 100 MG tablet Take 100 mg by mouth daily.      Historical Provider, MD  ALPRAZolam Prudy Feeler) 0.5 MG tablet Take 0.25 mg by mouth at bedtime.      Historical Provider, MD  aspirin EC 81 MG tablet Take 1 tablet (81 mg total) by mouth daily. 06/09/13   Hillis Range, MD  citalopram (CELEXA) 10 MG tablet Take 10 mg by mouth daily.      Historical Provider, MD  diltiazem (DILACOR XR) 180 MG 24 hr capsule Take 180 mg by mouth daily.      Historical Provider, MD  ENSURE (ENSURE) Take 237 mLs by mouth 2 (two) times daily.      Historical Provider, MD  folic acid (FOLVITE) 1 MG tablet Take 1 mg by mouth daily.      Historical Provider, MD  levothyroxine (SYNTHROID) 112 MCG tablet Take 1 tablet (112 mcg total) by mouth daily. 08/29/10 08/29/11  Vesta Mixer, MD  Oxybutynin Chloride (GELNIQUE TD) Place 1 packet onto the skin daily.     Historical Provider, MD  pantoprazole (PROTONIX) 40 MG tablet Take 40 mg by mouth daily.      Historical Provider, MD   Allergies  Allergen Reactions  . Aspirin    . Penicillins Rash    Physical Exam  Vitals  Blood pressure 88/43, pulse 92, temperature 101.2 F (38.4 C), temperature source Rectal, resp. rate 38, height 5\' 5"  (1.651 m), weight 68.493 kg (151 lb), SpO2 96.00%.  1. General elderly white female lying in bed looking frail and weak,  2. Normal affect and insight, Not Suicidal or Homicidal, Awake Alert, Oriented X 3.  3. No F.N deficits, ALL C.Nerves Intact, Strength 5/5 all 4 extremities, Sensation intact all 4 extremities, Plantars down going.  4. Ears and Eyes appear Normal, Conjunctivae clear, PERRLA. Moist Oral Mucosa.  5. Supple Neck, No JVD, No cervical lymphadenopathy appriciated, No Carotid Bruits.  6. Symmetrical Chest wall movement, Good air movement bilaterally, CTAB.  7. RRR, No Gallops, Rubs or Murmurs, No Parasternal Heave.  8. Positive Bowel Sounds, Abdomen Soft, Non tender, No organomegaly appriciated,No rebound -guarding or rigidity.  9. No Cyanosis, Normal Skin Turgor, No Skin Rash or Bruise.  10. Good muscle tone,  joints appear normal , no effusions, Normal ROM.  11. No Palpable Lymph Nodes in Neck or Axillae      Data Review  CBC  Recent Labs Lab 10/15/13 1132  WBC 18.7*  HGB 12.3  HCT 36.3  PLT 50*  MCV 86.4  MCH 29.3  MCHC 33.9  RDW 13.8  LYMPHSABS 0.7  MONOABS 0.7  EOSABS 0.0  BASOSABS 0.0    Chemistries   Recent Labs Lab 10/15/13 1132  NA 135*  K 3.0*  CL 94*  CO2 14*  GLUCOSE 79  BUN 36*  CREATININE 1.63*  CALCIUM 8.6  AST 78*  ALT 77*  ALKPHOS 144*  BILITOT 4.1*   ------------------------------------------------------------------------------------------------------------------ estimated creatinine clearance is 20.1 ml/min (by C-G formula based on Cr of 1.63). ------------------------------------------------------------------------------------------------------------------ No results found for this basename: TSH, T4TOTAL, FREET3, T3FREE, THYROIDAB,  in the last  72 hours  Coagulation profile No results found for this basename: INR, PROTIME,  in the last 168 hours --------------------------------------------------------------------------------------------------------------------- No results found for this basename: DDIMER,  in the last 72 hours  Cardiac Enzymes No results found for this basename: CK, CKMB, TROPONINI, MYOGLOBIN,  in the last 168 hours ------------------------------------------------------------------------------------------------------------------ No components found with this basename: POCBNP,  Lactic Acid, Venous No components found with this basename: lacticacidven     Imaging results:   Dg Chest Port 1 View  10/15/2013   CLINICAL DATA:  Fever and weakness  EXAM: PORTABLE CHEST - 1 VIEW  COMPARISON:  DG CHEST 1 VIEW dated 07/01/2011  FINDINGS: Low lung volumes with crowding of the central pulmonary markings. There is no focal parenchymal opacity, pleural effusion, or pneumothorax. The heart and mediastinal contours are unremarkable. There is a dual lead cardiac pacer in satisfactory position.Stable cardiomediastinal silhouette. Mild cardiomegaly.  IMPRESSION: No active disease.   Electronically Signed   By: Elige KoHetal  Patel   On: 10/15/2013 12:50     My personal review of CXR: no acute Cardio-Pulm process noted   My personal review of EKG: Rhythm Paced rhythm , Rate 85 /min,     Assessment & Plan    1. Septic shock. Present focuses UTI, however I have noted her liver enzymes are elevated along with total bilirubin, she has some right-sided flank pain although not typical right upper quadrant pain. Abdominal exam is unrevealing. For now blood cultures, broad-spectrum IV antibiotics which will include vancomycin and meropenem, IV fluids for resuscitation, if needed pressors, stat CT abdomen pelvis without contrast followed by right upper quadrant ultrasound. Monitor in stepped-down. C Care was called who requested Hospitalist  admission.    Addendum - 5.36pm - CT scan shows possible CBD stone, has a pacemaker will need ERCP, GI Dr Matthias HughsBuccini called. C care informed again.    2. Acute renal failure due to #1 above, IV fluids to hydrate and monitor.     3. Elevated liver enzymes with elevated total bilirubin. Could be shock liver, however imperative to rule out cholecystitis versus choledocholithiasis. CT scan and ultrasound as above. N.p.o. except for meds for now. Antibiotics as above.     4. Hypothyroidism. Place on IV Synthroid and monitor.     5. GERD. IV PPI.     6. Hypokalemia. Replace and monitor.    7. Thrombocytopenia. Likely due to sepsis and marrow suppression, no active bleeding. Will monitor closely. We'll avoid Lovenox heparin for now.     DVT Prophylaxis SCD  GI prophylaxis   PPI     AM Labs Ordered, also please review Full Orders  Admission, patients condition and plan of care including tests being ordered have been discussed with the patient and daughter-in-law and sister who indicate understanding and agree with the plan and Code Status.  Code Status no CPR or intubation  Condition GUARDED   Total Critical Care time in examining the patient bedside, evaluating Lab work and other data, over half of the total time was spent in coordinating patient care on the floor or bedisde in talking to patient/family members, communicating with nursing Staff on the floor and sub  to coordinate patients medical care and needs is  55  Minutes.   The condition which has caused critical injury/acute impairment of cardiovascular, renal vital organ system with a high probability of sudden clinically significant deterioration and can cause Potential Life threatening injury to this patient addressed today is septic shock due to UTI, hypotension, acute renal failure  Leroy SeaSINGH,PRASHANT K M.D on 10/15/2013 at 1:37 PM  Between 7am to 7pm - Pager - [phone], After 7pm go to [email or web address] - password  [email or web address]  And look for the night coverage person covering me after hours  [facility] Office  [phone]

## 2013-10-15 NOTE — Progress Notes (Signed)
Pt arrived to the unit from the ED via stretcher. Pt is alert and oriented times 4 although she is very HOH. Pt with low BP a total of 4 liter boluses were given in the ED before arrival to the unit. Pt from home with family. Stage I present on admission to the unit to her sacrum. Also has blanchable redness to her upper back present on admission to the unit. Pt settled into the room, family at bedside, will continue to monitor and transport to CT ABD and US of ABD as staffing allows. These procedures were to have been done while pt still in the ED but the pt was brought to the unit by ED staff before they had been completed.

## 2013-10-15 NOTE — Consult Note (Signed)
PULMONARY / CRITICAL CARE MEDICINE   Name: [name] MRN: [id number] DOB: [birth year]    ADMISSION DATE:  10/15/2013 CONSULTATION DATE:  10/15/2013  REFERRING MD :  Susa Raring  CHIEF COMPLAINT: Fever  BRIEF PATIENT DESCRIPTION:  80 yo female brought to ED by caregiver due to fever, weakness, and Rt sided flank pain from UTI, biliary dilation, and sepsis.  Had refractory hypotension related to septic shock and PCCM consulted.  Pt is DNR/DNI.   SIGNIFICANT EVENTS: 3/25 Admit, PCCM/GI consulted   STUDIES:  3/25 Abd u/s >> mild nodularity of liver 3/25 CT abd/pelvis >> severe dilation of intrahepatic and extrahepatic bile duct, CBD 2.2 cm, 4.1 cm infrarenal AAA, ileus  LINES / TUBES: PIV  CULTURES: Blood 3/25 >>  Urine 3/25 >>   ANTIBIOTICS: Meropenem 3/25 >> Vancomycin 3/25 >>   HISTORY OF PRESENT ILLNESS:   80 yo female brought to the ER with nausea, vomiting, fever and weakness. Found to have possible UTI and elevated LFT's with biliary dilation.  She required 5 liters fluid and still remained hypotensive >> she was transiently on pressors.  PCCM consulted to assist with sepsis management.  She is feeling better.  She denies headache, dyspnea, or chest pain.  Her nausea is better.  She was having Rt sided flank pain, but this is better.  PAST MEDICAL HISTORY :  Past Medical History  Diagnosis Date  . Complete heart block     s/p PPM by Dr Amil Amen 2004, recent Gen Change  07/13/10  . Small bowel obstruction   . Depression   . Hypertension   . Hypokalemia   . Recurrent UTI   . Gout   . GERD (gastroesophageal reflux disease)   . DVT (deep venous thrombosis)    Past Surgical History  Procedure Laterality Date  . Insert / replace / remove pacemaker      initial pacemaker by Dr Amil Amen 2004, replaced by [facility] for ERI 07/13/10  . Hip arthroplasty  07/02/2011    Procedure: ARTHROPLASTY BIPOLAR HIP;  Surgeon: Harvie Junior;  Location: [facility];  Service: Orthopedics;   Laterality: Left;   Prior to Admission medications   Medication Sig Start Date End Date Taking? Authorizing Provider  acetaminophen (TYLENOL) 325 MG tablet Take 650 mg by mouth every 4 (four) hours as needed. For pain.    Yes Historical Provider, MD  allopurinol (ZYLOPRIM) 100 MG tablet Take 100 mg by mouth daily.     Yes Historical Provider, MD  ALPRAZolam Prudy Feeler) 0.5 MG tablet Take 0.25 mg by mouth at bedtime.     Yes Historical Provider, MD  aspirin EC 81 MG tablet Take 1 tablet (81 mg total) by mouth daily. 06/09/13  Yes Hillis Range, MD  beta carotene w/minerals (OCUVITE) tablet Take 1 tablet by mouth daily.   Yes Historical Provider, MD  citalopram (CELEXA) 10 MG tablet Take 10 mg by mouth daily.     Yes Historical Provider, MD  diltiazem (DILACOR XR) 180 MG 24 hr capsule Take 180 mg by mouth daily.     Yes Historical Provider, MD  ENSURE (ENSURE) Take 237 mLs by mouth 2 (two) times daily.     Yes Historical Provider, MD  folic acid (FOLVITE) 1 MG tablet Take 1 mg by mouth daily.     Yes Historical Provider, MD  levothyroxine (SYNTHROID, LEVOTHROID) 112 MCG tablet Take 112 mcg by mouth daily before breakfast.   Yes Historical Provider, MD  Oxybutynin Chloride (GELNIQUE TD) Place 1  packet onto the skin daily.    Yes Historical Provider, MD  pantoprazole (PROTONIX) 40 MG tablet Take 40 mg by mouth daily.     Yes Historical Provider, MD   Allergies  Allergen Reactions  . Aspirin   . Penicillins Rash    FAMILY HISTORY:  Family History  Problem Relation Age of Onset  . Stroke Father    SOCIAL HISTORY:  reports that she quit smoking about 33 years ago. She has never used smokeless tobacco. She reports that she does not drink alcohol or use illicit drugs.  REVIEW OF SYSTEMS:   Negative except above.  SUBJECTIVE:   VITAL SIGNS: Temp:  [98 F (36.7 C)-101.2 F (38.4 C)] 98 F (36.7 C) (03/25 2000) Pulse Rate:  [75-98] 81 (03/25 2045) Resp:  [23-41] 34 (03/25 2045) BP:  (75-124)/(35-91) 106/62 mmHg (03/25 2045) SpO2:  [91 %-98 %] 94 % (03/25 2045) Weight:  [151 lb (68.493 kg)] 151 lb (68.493 kg) (03/25 1232) INTAKE / OUTPUT: Intake/Output     03/25 0701 - 03/26 0700   I.V. (mL/kg) 350 (5.1)   Total Intake(mL/kg) 350 (5.1)   Net +350         PHYSICAL EXAMINATION: General: Ill appearing Neuro:  Alert, normal strength, follows commands HEENT:  Patch over Rt eye, pupils reactive, dry oral mucosa, no LAN Cardiovascular:  Regular, no murmur Lungs:  No wheeze/rales Abdomen: mild distention, increased tympany, decreased bowel sounds, non tender Musculoskeletal:  No edema Skin:  No rashes  LABS:  CBC  Recent Labs Lab 10/15/13 1132  WBC 18.7*  HGB 12.3  HCT 36.3  PLT 50*   Coag's No results found for this basename: APTT, INR,  in the last 168 hours  BMET  Recent Labs Lab 10/15/13 1132  NA 135*  K 3.0*  CL 94*  CO2 14*  BUN 36*  CREATININE 1.63*  GLUCOSE 79   Electrolytes  Recent Labs Lab 10/15/13 1132  CALCIUM 8.6   Sepsis Markers  Recent Labs Lab 10/15/13 1149 10/15/13 1428  LATICACIDVEN 10.10*  --   PROCALCITON  --  33.72   ABG No results found for this basename: PHART, PCO2ART, PO2ART,  in the last 168 hours  Liver Enzymes  Recent Labs Lab 10/15/13 1132  AST 78*  ALT 77*  ALKPHOS 144*  BILITOT 4.1*  ALBUMIN 2.9*   Cardiac Enzymes No results found for this basename: TROPONINI, PROBNP,  in the last 168 hours  Glucose No results found for this basename: GLUCAP,  in the last 168 hours  Imaging Ct Abdomen Pelvis Wo Contrast  10/15/2013   CLINICAL DATA:  Fever.  EXAM: CT ABDOMEN AND PELVIS WITHOUT CONTRAST  TECHNIQUE: Multidetector CT imaging of the abdomen and pelvis was performed following the standard protocol without intravenous contrast.  COMPARISON:  DG PELVIS PORTABLE dated 07/02/2011; CT L SPINE W/O CM dated 03/02/2010  FINDINGS: Prior cholecystectomy. Severe dilatation of the intrahepatic and  extrahepatic biliary ducts. The common bile duct measures 2.2 cm in diameter. Common bile duct is distended to the level of the ampulla of Vater/ pancreatic head. A pancreatic head mass or ampullary mass cannot be excluded. ERCP should be considered for further evaluation. Punctate calcifications in this region may be related to ductal calcification and/or vascular calcifications. No definite calcified stone is noted in the distal common bile duct. Liver difficult to evaluate without contrast. Lucencies noted most likely distended biliary ducts. Spleen normal. Pancreas normal.  No focal adrenal abnormality identified. 3.1 cm simple  right renal cyst. 1.5 cm left renal cyst. No hydronephrosis or obstructing ureteral stone. The bladder is nondistended. Calcified uterine fibroids. Adnexa unremarkable. No free pelvic fluid.  No adenopathy. 4.1 x 4.1 infrarenal suprailiac abdominal aortic aneurysm is present. Ectasia of both common iliac arteries to 15 mm noted.  Prior right colonic surgery. Appendix is not definitely identified. Stool is present in the colon. Mild distention of small and large bowel noted. These findings suggest adynamic ileus. Follow-up abdominal series suggested. No site of bowel obstruction noted. Stool is present in the colon. Stomach is minimally distended. Hiatal hernia is present. No free air.  Cardiomegaly. Coronary artery disease. Cardiac pacer. Atelectasis in the lung bases. Small pleural effusions. Left hip prosthesis. Slight deformity right femoral neck noted. This may be from prior trauma. No acute fracture line is identified. If the patient has right hip symptoms, right hip series followup suggested .  Marland Kitchen.  1. Severe intrahepatic and extrahepatic biliary ductal dilatation with the common bile duct measuring 2.2 cm. The common bile duct is distended to the level of the ampulla /pancreatic head. A pancreatic head mass/malignancy cannot be excluded. ERCP is suggested for further evaluation. 2.  4.1 x 4.1 infrarenal, suprailiac abdominal aortic aneurysm with bilateral common iliac artery ectasia. 3. Adynamic ileus. 4. Cardiomegaly.  Coronary artery disease. 5. Slight deformity right femoral neck, this may be related to degenerative change and/or old trauma. No definite fracture identified. If patient is symptomatic in this region follow-up right hip series is suggested .   Electronically Signed   By: Maisie Fushomas  Register   On: 10/15/2013 17:15   Dg Chest Port 1 View  10/15/2013   CLINICAL DATA:  Fever and weakness  EXAM: PORTABLE CHEST - 1 VIEW  COMPARISON:  DG CHEST 1 VIEW dated 07/01/2011  FINDINGS: Low lung volumes with crowding of the central pulmonary markings. There is no focal parenchymal opacity, pleural effusion, or pneumothorax. The heart and mediastinal contours are unremarkable. There is a dual lead cardiac pacer in satisfactory position.Stable cardiomediastinal silhouette. Mild cardiomegaly.  IMPRESSION: No active disease.   Electronically Signed   By: Elige KoHetal  Patel   On: 10/15/2013 12:50   [place] Abdomen Limited Ruq  10/15/2013   CLINICAL DATA:  Sepsis, elevated LFTs  EXAM: US ABDOMEN LIMITED - RIGHT UPPER QUADRANT  COMPARISON:  None.  FINDINGS: Gallbladder:  Status post cholecystectomy.  Common bile duct:  Diameter: 20 mm. This is within normal limits given the post cholecystectomy state. It does taper distally into the region of the pancreatic head.  Liver:  Mild nodularity is noted which may be related to some underlying cirrhotic change.  IMPRESSION: Mild nodularity of the liver.  Status post cholecystectomy.  No acute abnormality is noted.   Electronically Signed   By: Alcide CleverMark  Lukens M.D.   On: 10/15/2013 16:43    ASSESSMENT / PLAN:  PULMONARY A: Atelectasis. P:   Monitor oxygen level Bronchial hygiene  CARDIOVASCULAR A:  Septic shock >> transient on pressors 3/25. Hx of HTN, complete heart block s/p PM. 4 cm infrarenal AAA. P:  Continue IV fluids Defer CVL placement Hold  outpt ASA, diltiazem  RENAL A:   Acute kidney injury 2nd to shock >> uncertain what baseline renal fx is. Hypokalemia. Anion gap acidosis 2nd to lactic acidosis. P:   Continue volume resuscitation Monitor renal fx, urine outpt F/u and replace electrolytes as needed F/u lactic acid level  GASTROINTESTINAL A:   Elevated LFT's with biliary dilation. Ileus. P:  NPO May need EUS or ERCP >> defer to GI F/u LFT's Protonix for SUP  HEMATOLOGIC A:  Thrombocytopenia in setting of sepsis. P:  F/u CBC SCD for DVT prevention  INFECTIOUS A:   Septic shock 2nd to UTI, and biliary dilation. P:   Day 1 meropenem vancomycin F/u procalcitonin  ENDOCRINE A:   Hx of hypothyroidism. P:   Continue synthroid IV  NEUROLOGIC A:   Hx of depression. P:   PRN ativan IV for sleep Hold outpt xanax, celexa for now  D/w Dr Matthias Hughs  CC time 40 minutes.  Coralyn Helling, MD [facility] Pulmonary/Critical Care 10/15/2013, 9:59 PM Pager:  [phone] After 3pm call: [phone]

## 2013-10-15 NOTE — ED Notes (Signed)
Portable chest xray in progress.

## 2013-10-15 NOTE — ED Provider Notes (Signed)
CSN: [id number]     Arrival date & time 10/15/13  1119 History   First MD Initiated Contact with Patient 10/15/13 1127     Chief Complaint  Patient presents with  . Fever  . Weakness     (Consider location/radiation/quality/duration/timing/severity/associated sxs/prior Treatment) Patient is a 80 y.o. female presenting with fever and weakness. The history is provided by the patient.  Fever Max temp prior to arrival:  99.5 Temp source:  Oral Severity:  Moderate Onset quality:  Gradual Timing:  Constant Progression:  Worsening Chronicity:  New Associated symptoms: no chest pain, no cough and no vomiting   Weakness This is a new problem. The current episode started more than 2 days ago. The problem occurs constantly. The problem has not changed since onset.Pertinent negatives include no chest pain, no abdominal pain and no shortness of breath. Nothing aggravates the symptoms.    Past Medical History  Diagnosis Date  . Complete heart block     s/p PPM by Dr Amil Amen 2004, recent Gen Change  07/13/10  . Small bowel obstruction   . Depression   . Hypertension   . Hypokalemia   . Recurrent UTI   . Gout   . GERD (gastroesophageal reflux disease)   . DVT (deep venous thrombosis)    Past Surgical History  Procedure Laterality Date  . Insert / replace / remove pacemaker      initial pacemaker by Dr Amil Amen 2004, replaced by [facility] for ERI 07/13/10  . Hip arthroplasty  07/02/2011    Procedure: ARTHROPLASTY BIPOLAR HIP;  Surgeon: Harvie Junior;  Location: [facility] ORS;  Service: Orthopedics;  Laterality: Left;   Family History  Problem Relation Age of Onset  . Stroke Father    History  Substance Use Topics  . Smoking status: Former Smoker    Quit date: 07/24/1980  . Smokeless tobacco: Not on file  . Alcohol Use: No   OB History   Grav Para Term Preterm Abortions TAB SAB Ect Mult Living                 Review of Systems  Constitutional: Negative for fever.  Respiratory: Negative  for cough and shortness of breath.   Cardiovascular: Negative for chest pain.  Gastrointestinal: Negative for vomiting and abdominal pain.  Neurological: Positive for weakness.  All other systems reviewed and are negative.      Allergies  Aspirin and Penicillins  Home Medications   Current Outpatient Rx  Name  Route  Sig  Dispense  Refill  . acetaminophen (TYLENOL) 325 MG tablet   Oral   Take 650 mg by mouth every 4 (four) hours as needed. For pain.          Marland Kitchen allopurinol (ZYLOPRIM) 100 MG tablet   Oral   Take 100 mg by mouth daily.           Marland Kitchen ALPRAZolam (XANAX) 0.5 MG tablet   Oral   Take 0.25 mg by mouth at bedtime.           Marland Kitchen aspirin EC 81 MG tablet   Oral   Take 1 tablet (81 mg total) by mouth daily.   90 tablet   3   . citalopram (CELEXA) 10 MG tablet   Oral   Take 10 mg by mouth daily.           Marland Kitchen diltiazem (DILACOR XR) 180 MG 24 hr capsule   Oral   Take 180 mg by mouth daily.           Marland Kitchen  ENSURE (ENSURE)   Oral   Take 237 mLs by mouth 2 (two) times daily.           . folic acid (FOLVITE) 1 MG tablet   Oral   Take 1 mg by mouth daily.           Marland Kitchen. EXPIRED: levothyroxine (SYNTHROID) 112 MCG tablet   Oral   Take 1 tablet (112 mcg total) by mouth daily.   30 tablet   11   . Oxybutynin Chloride (GELNIQUE TD)   Transdermal   Place 1 packet onto the skin daily.          . pantoprazole (PROTONIX) 40 MG tablet   Oral   Take 40 mg by mouth daily.            BP 78/36  Pulse 98  Resp 24  SpO2 93% Physical Exam  Nursing note and vitals reviewed. Constitutional: She is oriented to person, place, and time. She appears well-developed and well-nourished. No distress.  HENT:  Head: Normocephalic and atraumatic.  Mucus membranes severely dry  Eyes: EOM are normal. Pupils are equal, round, and reactive to light.  Neck: Normal range of motion. Neck supple.  Cardiovascular: Normal rate and regular rhythm.  Exam reveals no friction rub.    No murmur heard. Pulmonary/Chest: Effort normal and breath sounds normal. No respiratory distress. She has no wheezes. She has no rales.  Abdominal: Soft. She exhibits no distension. There is no tenderness. There is no rebound.  Musculoskeletal: Normal range of motion. She exhibits no edema.  Neurological: She is alert and oriented to person, place, and time. No cranial nerve deficit. She exhibits normal muscle tone. Coordination normal.  Skin: No rash noted. She is not diaphoretic.    ED Course  Procedures (including critical care time) Labs Review Labs Reviewed  CULTURE, BLOOD (ROUTINE X 2)  CULTURE, BLOOD (ROUTINE X 2)  URINE CULTURE  CBC WITH DIFFERENTIAL  COMPREHENSIVE METABOLIC PANEL  URINALYSIS, ROUTINE W REFLEX MICROSCOPIC  I-STAT CG4 LACTIC ACID, ED   Imaging Review Dg Chest Port 1 View  10/15/2013   CLINICAL DATA:  Fever and weakness  EXAM: PORTABLE CHEST - 1 VIEW  COMPARISON:  DG CHEST 1 VIEW dated 07/01/2011  FINDINGS: Low lung volumes with crowding of the central pulmonary markings. There is no focal parenchymal opacity, pleural effusion, or pneumothorax. The heart and mediastinal contours are unremarkable. There is a dual lead cardiac pacer in satisfactory position.Stable cardiomediastinal silhouette. Mild cardiomegaly.  IMPRESSION: No active disease.   Electronically Signed   By: Elige KoHetal  Patel   On: 10/15/2013 12:50     EKG Interpretation   Date/Time:  Wednesday October 15 2013 11:32:05 EDT Ventricular Rate:  85 PR Interval:  191 QRS Duration: 171 QT Interval:  462 QTC Calculation: 549 R Axis:   -80 Text Interpretation:  A-V dual-paced rhythm with some inhibition No  further analysis attempted due to paced rhythm Similar to prior Confirmed  by [facility]  MD, Rodrick Payson ([id number]) on 10/15/2013 11:45:08 AM     CRITICAL CARE Performed by: Dagmar HaitWALDEN, WILLIAM Jermar Colter   Total critical care time: 30 minutes  Critical care time was exclusive of separately billable procedures and  treating other patients.  Critical care was necessary to treat or prevent imminent or life-threatening deterioration.  Critical care was time spent personally by me on the following activities: development of treatment plan with patient and/or surrogate as well as nursing, discussions with consultants, evaluation of patient's response to  treatment, examination of patient, obtaining history from patient or surrogate, ordering and performing treatments and interventions, ordering and review of laboratory studies, ordering and review of radiographic studies, pulse oximetry and re-evaluation of patient's condition.  MDM   Final diagnoses:  UTI (lower urinary tract infection)  Anemia, unspecified  AV block  Central retinal artery occlusion  DVT (deep venous thrombosis)  Pacemaker  Recurrent UTI  Small bowel obstruction  SSS (sick sinus syndrome)    18F presents with fever, weakness for past week. Denies SOB, but is having cough. No vomiting, no recent diarrhea. No hx of UTIs per her. Lives at home with caregivers coming in to see her. Initial BP 78/36, not tachycardic. Tylenol given by EMS. Will initiate Level 2 sepsis, give fluids, antibiotics.  Labs show UTI. Critical Care consulted, will not see patient with hx of DNR status as she will not likely want heroic measures. Dr. Thedore Mins will accept patient, he spoke with them about DNR status - family stated patient was no CPR or intubation. Patient to go to stepdown.  Labs also show elevated bilirubin, Dr. Thedore Mins ordering CT scan.   Dagmar Hait, MD 10/15/13 [phone]

## 2013-10-15 NOTE — ED Notes (Signed)
Pt c/o fever and weakness for the past week.  Denies pain. Pt was given Tylenol 1000 mg po by EMS at 1100.

## 2013-10-16 DIAGNOSIS — N390 Urinary tract infection, site not specified: Secondary | ICD-10-CM

## 2013-10-16 DIAGNOSIS — R404 Transient alteration of awareness: Secondary | ICD-10-CM

## 2013-10-16 DIAGNOSIS — A419 Sepsis, unspecified organism: Secondary | ICD-10-CM

## 2013-10-16 DIAGNOSIS — Z950 Presence of cardiac pacemaker: Secondary | ICD-10-CM

## 2013-10-16 LAB — BASIC METABOLIC PANEL
BUN: 38 mg/dL — ABNORMAL HIGH (ref 6–23)
CO2: 13 mEq/L — ABNORMAL LOW (ref 19–32)
CREATININE: 1.43 mg/dL — AB (ref 0.50–1.10)
Calcium: 7.7 mg/dL — ABNORMAL LOW (ref 8.4–10.5)
Chloride: 107 mEq/L (ref 96–112)
GFR calc Af Amer: 35 mL/min — ABNORMAL LOW (ref >90)
GFR calc non Af Amer: 30 mL/min — ABNORMAL LOW (ref >90)
GLUCOSE: 79 mg/dL (ref 70–99)
POTASSIUM: 4.9 meq/L (ref 3.7–5.3)
Sodium: 136 mEq/L — ABNORMAL LOW (ref 137–147)

## 2013-10-16 LAB — HEPATIC FUNCTION PANEL
ALBUMIN: 2.3 g/dL — AB (ref 3.5–5.2)
ALK PHOS: 120 U/L — AB (ref 39–117)
ALT: 68 U/L — ABNORMAL HIGH (ref 0–35)
ALT: 67 U/L — ABNORMAL HIGH (ref 0–35)
AST: 88 U/L — AB (ref 0–37)
AST: 83 U/L — ABNORMAL HIGH (ref 0–37)
Albumin: 2.5 g/dL — ABNORMAL LOW (ref 3.5–5.2)
Alkaline Phosphatase: 118 U/L — ABNORMAL HIGH (ref 39–117)
Bilirubin, Direct: 3 mg/dL — ABNORMAL HIGH (ref 0.0–0.3)
Bilirubin, Direct: 2.6 mg/dL — ABNORMAL HIGH (ref 0.0–0.3)
Indirect Bilirubin: 0.7 mg/dL (ref 0.3–0.9)
Indirect Bilirubin: 0.6 mg/dL (ref 0.3–0.9)
TOTAL PROTEIN: 5.2 g/dL — AB (ref 6.0–8.3)
Total Bilirubin: 3.7 mg/dL — ABNORMAL HIGH (ref 0.3–1.2)
Total Bilirubin: 3.2 mg/dL — ABNORMAL HIGH (ref 0.3–1.2)
Total Protein: 5.4 g/dL — ABNORMAL LOW (ref 6.0–8.3)

## 2013-10-16 LAB — PROTIME-INR
INR: 1.47 (ref 0.00–1.49)
PROTHROMBIN TIME: 17.4 s — AB (ref 11.6–15.2)

## 2013-10-16 LAB — MAGNESIUM: Magnesium: 1.5 mg/dL (ref 1.5–2.5)

## 2013-10-16 LAB — URINE CULTURE
CULTURE: NO GROWTH
Colony Count: NO GROWTH

## 2013-10-16 LAB — CBC
HEMATOCRIT: 36.8 % (ref 36.0–46.0)
HEMOGLOBIN: 12.1 g/dL (ref 12.0–15.0)
MCH: 28.6 pg (ref 26.0–34.0)
MCHC: 32.9 g/dL (ref 30.0–36.0)
MCV: 87 fL (ref 78.0–100.0)
Platelets: 38 10*3/uL — ABNORMAL LOW (ref 150–400)
RBC: 4.23 MIL/uL (ref 3.87–5.11)
RDW: 14.4 % (ref 11.5–15.5)
WBC: 17.1 10*3/uL — ABNORMAL HIGH (ref 4.0–10.5)

## 2013-10-16 LAB — LACTIC ACID, PLASMA: Lactic Acid, Venous: 4.7 mmol/L — ABNORMAL HIGH (ref 0.5–2.2)

## 2013-10-16 LAB — PROCALCITONIN

## 2013-10-16 MED ORDER — SODIUM CHLORIDE 0.9 % IV BOLUS (SEPSIS): 1000.0000 mL | INTRAVENOUS | Status: DC | PRN

## 2013-10-16 MED ORDER — HALOPERIDOL LACTATE 5 MG/ML IJ SOLN
1.0000 mg | Freq: Four times a day (QID) | INTRAMUSCULAR | Status: DC | PRN
  Administered 2013-10-16: 1 mg via INTRAVENOUS
  Filled 2013-10-16: qty 1

## 2013-10-16 MED ORDER — SODIUM BICARBONATE 8.4 % IV SOLN
INTRAVENOUS | Status: AC
  Administered 2013-10-16 (×2): via INTRAVENOUS
  Filled 2013-10-16 (×5): qty 150

## 2013-10-16 NOTE — Progress Notes (Signed)
[facility]                                                                                    Patient Demographics  [name], is a 80 y.o. female, DOB - Jan 18, 1917, ZOX:[id number]  Admit date - 10/15/2013   Admitting Physician Leroy Sea, MD  Outpatient Primary MD for the patient is [name], MD  LOS - 1   Chief Complaint  Patient presents with  . Fever  . Weakness        Assessment & Plan    1. Septic shock. Now cleared to focus is biliary obstruction with ascending cholangitis. She status post cholecystectomy in the past, this could represent a pancreatic mass compressing on the CBD. GI on board or possible ERCP for now continue aggressive IV fluids, IV bicarbonate drip, IV antibiotics which will be tapered down to meropenem only for now, follow blood culture. Hemodynamically remains tenuous continue Levophed on a when necessary basis which he needed last night, aggressive IV fluids to be continued for now.     2. Acute renal failure due to #1 above, IV fluids to hydrate and monitor.      3. Elevated liver enzymes with elevated total bilirubin. Secondary to #1 above     4. Hypothyroidism. Place on IV Synthroid and monitor.     5. GERD. IV PPI.      6. Hypokalemia. Replace and monitor.      7. Thrombocytopenia. Likely due to sepsis and marrow suppression, no active bleeding. Will monitor closely. We'll avoid Lovenox heparin for now.     8. Severe metabolic acidosis due to septic shock. We'll switch IV fluids to IV bicarbonate drip, if blood pressure becomes an issue normal saline boluses along with Levophed as needed.      Code Status: No intubation or CPR, condition remains extremely guarded.  Family Communication: Discussed with daughter-in-law and sister  Disposition Plan: Remains in ICU will eventually need SNF   Procedures CT scan abdomen pelvis, right upper quadrant ultrasound, possible ERCP   Consults  GI,  PCCM   Medications  Scheduled Meds: . levothyroxine  50 mcg Intravenous Daily  . meropenem (MERREM) IV  500 mg Intravenous Q12H  . pantoprazole (PROTONIX) IV  40 mg Intravenous QHS  . saccharomyces boulardii  250 mg Oral BID  . sodium chloride  3 mL Intravenous Q12H  . [START ON 10/17/2013] vancomycin  1,000 mg Intravenous Q48H   Continuous Infusions: . phenylephrine (NEO-SYNEPHRINE) Adult infusion Stopped (10/15/13 2147)  .  sodium bicarbonate  infusion 1000 mL     PRN Meds:.morphine injection, ondansetron (ZOFRAN) IV, sodium chloride  DVT Prophylaxis   SCDs    Lab Results  Component Value Date   PLT 38* 10/16/2013    Antibiotics    Anti-infectives   Start     Dose/Rate Route Frequency Ordered Stop   10/17/13 1200  vancomycin (VANCOCIN) IVPB 1000 mg/200 mL premix     1,000 mg 200 mL/hr over 60 Minutes Intravenous Every 48 hours 10/15/13 1243     10/16/13 1200  ceFEPIme (MAXIPIME) 1 g in dextrose 5 % 50 mL IVPB  Status:  Discontinued     1 g 100 mL/hr over 30 Minutes Intravenous Every 24 hours 10/15/13 1243 10/15/13 1405   10/15/13 1500  meropenem (MERREM) 500 mg in sodium chloride 0.9 % 50 mL IVPB     500 mg 100 mL/hr over 30 Minutes Intravenous Every 12 hours 10/15/13 1405     10/15/13 1200  ceFEPIme (MAXIPIME) 2 g in dextrose 5 % 50 mL IVPB     2 g 100 mL/hr over 30 Minutes Intravenous STAT 10/15/13 1146 10/15/13 1227   10/15/13 1145  vancomycin (VANCOCIN) IVPB 1000 mg/200 mL premix     1,000 mg 200 mL/hr over 60 Minutes Intravenous  Once 10/15/13 1137 10/15/13 1257          Subjective:   Marily Memos Maduro today has, No headache, No chest pain, No abdominal pain - No Nausea, No new weakness tingling or numbness, No Cough - SOB.    Objective:   Filed Vitals:   10/16/13 0400 10/16/13 0500 10/16/13 0600 10/16/13 0800  BP: 120/49 176/114 113/50   Pulse: 87 90 82   Temp: 98.2 F (36.8 C)   97.7 F (36.5 C)  TempSrc: Oral   Oral  Resp: 35 30 32   Height:       Weight: 76.8 kg (169 lb 5 oz)     SpO2: 96% 100% 96%     Wt Readings from Last 3 Encounters:  10/16/13 76.8 kg (169 lb 5 oz)  08/27/13 69.854 kg (154 lb)  07/01/13 69.455 kg (153 lb 1.9 oz)     Intake/Output Summary (Last 24 hours) at 10/16/13 0904 Last data filed at 10/16/13 0600  Gross per 24 hour  Intake 1986.75 ml  Output    175 ml  Net 1811.75 ml    Exam Awake Alert, Oriented X 3, No new F.N deficits, Normal affect, 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  Symmetrical Chest wall movement, Good air movement bilaterally, CTAB RRR,No Gallops,Rubs or new Murmurs, No Parasternal Heave +ve B.Sounds, Abd Soft, Non tender, No organomegaly appriciated, No rebound - guarding or rigidity. No Cyanosis, Clubbing or edema, No new Rash or bruise     Data Review   Micro Results Recent Results (from the past 240 hour(s))  CULTURE, BLOOD (ROUTINE X 2)     Status: None   Collection Time    10/15/13 11:32 AM      Result Value Ref Range Status   Specimen Description BLOOD RIGHT WRIST   Final   Special Requests BOTTLES DRAWN AEROBIC AND ANAEROBIC 5 CC EA   Final   Culture  Setup Time     Final   Value: 10/15/2013 14:17     Performed at [facility]   Culture     Final   Value: GRAM NEGATIVE RODS     Note: Gram Stain Report Called to,Read Back By and Verified With: MINDY HOPPER 10/16/13 AT 0135 RIDK     Performed at [facility]   Report Status PENDING   Incomplete  CULTURE, BLOOD (ROUTINE X 2)     Status: None   Collection Time    10/15/13 11:45 AM      Result Value Ref Range Status   Specimen Description BLOOD LEFT ANTECUBITAL   Final   Special Requests BOTTLES DRAWN AEROBIC AND ANAEROBIC   Final   Culture  Setup Time     Final   Value: 10/15/2013 14:17  Performed at [facility]     Final   Value: GRAM NEGATIVE RODS     Note: Gram Stain Report Called to,Read Back By  and Verified With: MINDY HOPPER 10/16/13 AT 0135 RIDK     Performed at [facility]   Report Status PENDING   Incomplete  MRSA PCR SCREENING     Status: None   Collection Time    10/15/13  2:21 PM      Result Value Ref Range Status   MRSA by PCR NEGATIVE  NEGATIVE Final   Comment:            The GeneXpert MRSA Assay (FDA     approved for NASAL specimens     only), is one component of a     comprehensive MRSA colonization     surveillance program. It is not     intended to diagnose MRSA     infection nor to guide or     monitor treatment for     MRSA infections.    Radiology Reports Ct Abdomen Pelvis Wo Contrast  10/15/2013   CLINICAL DATA:  Fever.  EXAM: CT ABDOMEN AND PELVIS WITHOUT CONTRAST  TECHNIQUE: Multidetector CT imaging of the abdomen and pelvis was performed following the standard protocol without intravenous contrast.  COMPARISON:  DG PELVIS PORTABLE dated 07/02/2011; CT L SPINE W/O CM dated 03/02/2010  FINDINGS: Prior cholecystectomy. Severe dilatation of the intrahepatic and extrahepatic biliary ducts. The common bile duct measures 2.2 cm in diameter. Common bile duct is distended to the level of the ampulla of Vater/ pancreatic head. A pancreatic head mass or ampullary mass cannot be excluded. ERCP should be considered for further evaluation. Punctate calcifications in this region may be related to ductal calcification and/or vascular calcifications. No definite calcified stone is noted in the distal common bile duct. Liver difficult to evaluate without contrast. Lucencies noted most likely distended biliary ducts. Spleen normal. Pancreas normal.  No focal adrenal abnormality identified. 3.1 cm simple right renal cyst. 1.5 cm left renal cyst. No hydronephrosis or obstructing ureteral stone. The bladder is nondistended. Calcified uterine fibroids. Adnexa unremarkable. No free pelvic fluid.  No adenopathy. 4.1 x 4.1 infrarenal suprailiac abdominal aortic aneurysm is present.  Ectasia of both common iliac arteries to 15 mm noted.  Prior right colonic surgery. Appendix is not definitely identified. Stool is present in the colon. Mild distention of small and large bowel noted. These findings suggest adynamic ileus. Follow-up abdominal series suggested. No site of bowel obstruction noted. Stool is present in the colon. Stomach is minimally distended. Hiatal hernia is present. No free air.  Cardiomegaly. Coronary artery disease. Cardiac pacer. Atelectasis in the lung bases. Small pleural effusions. Left hip prosthesis. Slight deformity right femoral neck noted. This may be from prior trauma. No acute fracture line is identified. If the patient has right hip symptoms, right hip series followup suggested .  Marland Kitchen  1. Severe intrahepatic and extrahepatic biliary ductal dilatation with the common bile duct measuring 2.2 cm. The common bile duct is distended to the level of the ampulla /pancreatic head. A pancreatic head mass/malignancy cannot be excluded. ERCP is suggested for further evaluation. 2. 4.1 x 4.1 infrarenal, suprailiac abdominal aortic aneurysm with bilateral common iliac artery ectasia. 3. Adynamic ileus. 4. Cardiomegaly.  Coronary artery disease. 5. Slight deformity right femoral neck, this may be related to degenerative change and/or old trauma. No definite fracture identified. If patient is symptomatic in this region  follow-up right hip series is suggested .   Electronically Signed   By: Maisie Fus  Register   On: 10/15/2013 17:15   Dg Chest Port 1 View  10/15/2013   CLINICAL DATA:  Fever and weakness  EXAM: PORTABLE CHEST - 1 VIEW  COMPARISON:  DG CHEST 1 VIEW dated 07/01/2011  FINDINGS: Low lung volumes with crowding of the central pulmonary markings. There is no focal parenchymal opacity, pleural effusion, or pneumothorax. The heart and mediastinal contours are unremarkable. There is a dual lead cardiac pacer in satisfactory position.Stable cardiomediastinal silhouette. Mild  cardiomegaly.  IMPRESSION: No active disease.   Electronically Signed   By: Elige Ko   On: 10/15/2013 12:50   US Abdomen Limited Ruq  10/15/2013   CLINICAL DATA:  Sepsis, elevated LFTs  EXAM: US ABDOMEN LIMITED - RIGHT UPPER QUADRANT  COMPARISON:  None.  FINDINGS: Gallbladder:  Status post cholecystectomy.  Common bile duct:  Diameter: 20 mm. This is within normal limits given the post cholecystectomy state. It does taper distally into the region of the pancreatic head.  Liver:  Mild nodularity is noted which may be related to some underlying cirrhotic change.  IMPRESSION: Mild nodularity of the liver.  Status post cholecystectomy.  No acute abnormality is noted.   Electronically Signed   By: Alcide Clever M.D.   On: 10/15/2013 16:43    CBC  Recent Labs Lab 10/15/13 1132 10/16/13 0310  WBC 18.7* 17.1*  HGB 12.3 12.1  HCT 36.3 36.8  PLT 50* 38*  MCV 86.4 87.0  MCH 29.3 28.6  MCHC 33.9 32.9  RDW 13.8 14.4  LYMPHSABS 0.7  --   MONOABS 0.7  --   EOSABS 0.0  --   BASOSABS 0.0  --     Chemistries   Recent Labs Lab 10/15/13 1132 10/16/13 0310  NA 135* 136*  K 3.0* 4.9  CL 94* 107  CO2 14* 13*  GLUCOSE 79 79  BUN 36* 38*  CREATININE 1.63* 1.43*  CALCIUM 8.6 7.7*  MG  --  1.5  AST 78* 88*  ALT 77* 68*  ALKPHOS 144* 118*  BILITOT 4.1* 3.7*   ------------------------------------------------------------------------------------------------------------------ estimated creatinine clearance is 24.1 ml/min (by C-G formula based on Cr of 1.43). ------------------------------------------------------------------------------------------------------------------ No results found for this basename: HGBA1C,  in the last 72 hours ------------------------------------------------------------------------------------------------------------------ No results found for this basename: CHOL, HDL, LDLCALC, TRIG, CHOLHDL, LDLDIRECT,  in the last 72  hours ------------------------------------------------------------------------------------------------------------------ No results found for this basename: TSH, T4TOTAL, FREET3, T3FREE, THYROIDAB,  in the last 72 hours ------------------------------------------------------------------------------------------------------------------ No results found for this basename: VITAMINB12, FOLATE, FERRITIN, TIBC, IRON, RETICCTPCT,  in the last 72 hours  Coagulation profile No results found for this basename: INR, PROTIME,  in the last 168 hours  No results found for this basename: DDIMER,  in the last 72 hours  Cardiac Enzymes No results found for this basename: CK, CKMB, TROPONINI, MYOGLOBIN,  in the last 168 hours ------------------------------------------------------------------------------------------------------------------ No components found with this basename: POCBNP,       Total Critical Care time in examining the patient bedside, evaluating Lab work and other data, over half of the total time was spent in coordinating patient care on the floor or bedisde in talking to patient/family members, communicating with nursing Staff on the floor and sub specialists GI  to coordinate patients medical care and needs is  45 Minutes.   The condition which has caused critical injury/acute impairment of CVS vital organ system with a high probability of  sudden clinically significant deterioration and can cause Potential Life threatening injury to this patient addressed today is  septic shock with hypotension, acute renal failure, metabolic acidosis requiring IV fluids for resuscitation, IV bicarbonate drip.  See all Orders from today for further details     Leroy SeaSINGH,Burle Kwan K M.D on 10/16/2013 at 9:04 AM  Between 7am to 7pm - Pager - [phone]  After 7pm go to [email or web address] - password [email or web address]  And look for the night coverage person covering for me after hours  [facility] Office   [phone]

## 2013-10-16 NOTE — Progress Notes (Signed)
Subjective: Blood pressure improving with antibiotics. Has some upper abdominal discomfort.  Objective: Vital signs in last 24 hours: Temp:  [97.7 F (36.5 C)-101.2 F (38.4 C)] 97.7 F (36.5 C) (03/26 0800) Pulse Rate:  [75-98] 82 (03/26 0600) Resp:  [23-41] 32 (03/26 0600) BP: (75-176)/(35-114) 113/50 mmHg (03/26 0600) SpO2:  [91 %-100 %] 96 % (03/26 0600) Weight:  [68.493 kg (151 lb)-76.8 kg (169 lb 5 oz)] 76.8 kg (169 lb 5 oz) (03/26 0400) Weight change:  Last BM Date: 10/15/13  PE: GEN:  Alert, disoriented, a bit tachypneic ABD:  Protuberant, mild generalized tenderness NEURO:  Confused  Lab Results: CBC    Component Value Date/Time   WBC 17.1* 10/16/2013 0310   RBC 4.23 10/16/2013 0310   HGB 12.1 10/16/2013 0310   HCT 36.8 10/16/2013 0310   PLT 38* 10/16/2013 0310   MCV 87.0 10/16/2013 0310   MCH 28.6 10/16/2013 0310   MCHC 32.9 10/16/2013 0310   RDW 14.4 10/16/2013 0310   LYMPHSABS 0.7 10/15/2013 1132   MONOABS 0.7 10/15/2013 1132   EOSABS 0.0 10/15/2013 1132   BASOSABS 0.0 10/15/2013 1132   CMP     Component Value Date/Time   NA 136* 10/16/2013 0310   K 4.9 10/16/2013 0310   CL 107 10/16/2013 0310   CO2 13* 10/16/2013 0310   GLUCOSE 79 10/16/2013 0310   BUN 38* 10/16/2013 0310   CREATININE 1.43* 10/16/2013 0310   CALCIUM 7.7* 10/16/2013 0310   PROT 5.4* 10/16/2013 0310   ALBUMIN 2.5* 10/16/2013 0310   AST 88* 10/16/2013 0310   ALT 68* 10/16/2013 0310   ALKPHOS 118* 10/16/2013 0310   BILITOT 3.7* 10/16/2013 0310   GFRNONAA 30* 10/16/2013 0310   GFRAA 35* 10/16/2013 0310   Assessment:  1.  Dilated bile duct, elevated LFTs, fevers, most consistent with cholangitis. 2.  Altered mental status and tachypnea likely from sepsis.  Plan:  1.  I had long discussion with patient's daughter-in-law, [name], who serves as family spokesperson.  I have advised ERCP for biliary decompression, and briefly explained the procedure to her. 2.  [name] tells me that her mother-in-law was very  clear in her desire not to be intubated.  I told her that she would almost assuredly need to be intubated for the procedure.  As a result, [name] is going to discuss the situation with other family members and get back to me with an update. 3.  I counseled [name] that her mother-in-law's situation was serious, but that it would be acceptable to continue fluids and antibiotics today while the family makes a decision re: ERCP with intubation.  I asked [name] to make sure the nurse contacts me today with their decision, so we can make plans for possible ERCP tomorrow if family is ultimately amenable to it. 4.  Over 30 minutes was spent in patient counseling, decision-making, and coordination-of-care. 5.  Will follow.  Freddy JakschOUTLAW,Arnold Depinto M 10/16/2013, 9:51 AM

## 2013-10-16 NOTE — Progress Notes (Signed)
SLP Cancellation Note  Patient Details Name: [name] MRN: [id number] DOB: 16-Mar-1917   Cancelled treatment:       Reason Eval/Treat Not Completed: Medical issues which prohibited therapy (per RN, CCM MD wishes for patient to remain NPO. Will f/u 3/27. )  Ferdinand LangoLeah Karrina Lye MA, CCC-SLP [phone]  [name] 10/16/2013, 9:15 AM

## 2013-10-16 NOTE — Progress Notes (Signed)
Patient continuously trying to get out of bed, ripping off leads, and has now ripped out IV in L arm. Patient bleeding from site and now a skin tear is present from where patient ripped out IV. Bleeding controlled. Patient given bath and linen changed due to blood being on sheets and patient. NP notified of agitation and how ativan made patient more agitated when given last night. Haldol given instead. Restraints also ordered. Family notified and on way to come stay with patient.

## 2013-10-16 NOTE — Progress Notes (Signed)
CRITICAL VALUE ALERT  Critical value received:  Positive blood cultures, anaerobic bottles of both sets  Date of notification:  10/16/2013  Time of notification:  0137  Critical value read back:yes  Nurse who received alert:  Marcellina MillinMindy Cade Dashner, RN  MD notified (1st page):  Dr. Delton CoombesByrum  Time of first page:  0140  MD notified (2nd page):  Time of second page:  Responding MD:  Dr. Delton CoombesByrum  Time MD responded:  [phone]

## 2013-10-16 NOTE — Consult Note (Signed)
PULMONARY / CRITICAL CARE MEDICINE   Name: [name] MRN: [id number] DOB: [birth year]    ADMISSION DATE:  10/15/2013 CONSULTATION DATE:  10/15/2013  REFERRING MD :  Susa Raring  CHIEF COMPLAINT: Fever  BRIEF PATIENT DESCRIPTION:  80 yo female brought to ED by caregiver due to fever, weakness, and Rt sided flank pain from UTI, biliary dilation, and sepsis.  Had refractory hypotension related to septic shock and PCCM consulted.  Pt is DNR/DNI.   SIGNIFICANT EVENTS: 3/25 Admit, PCCM/GI consulted   STUDIES:  3/25 Abd u/s >> mild nodularity of liver 3/25 CT abd/pelvis >> severe dilation of intrahepatic and extrahepatic bile duct, CBD 2.2 cm, 4.1 cm infrarenal AAA, ileus  LINES / TUBES: PIV  CULTURES: Blood 3/25 >>  Urine 3/25 >> gnr>>  ANTIBIOTICS: Meropenem 3/25 >> Vancomycin 3/25 >>   HISTORY OF PRESENT ILLNESS:   80 yo female brought to the ER with nausea, vomiting, fever and weakness. Found to have possible UTI and elevated LFT's with biliary dilation.  She required 5 liters fluid and still remained hypotensive >> she was transiently on pressors.  PCCM consulted to assist with sepsis management.   SUBJECTIVE:  HOH but no acute distress.  VITAL SIGNS: Temp:  [97.7 F (36.5 C)-101.2 F (38.4 C)] 97.7 F (36.5 C) (03/26 0800) Pulse Rate:  [75-98] 82 (03/26 0600) Resp:  [23-41] 32 (03/26 0600) BP: (75-176)/(35-114) 113/50 mmHg (03/26 0600) SpO2:  [91 %-100 %] 96 % (03/26 0600) Weight:  [68.493 kg (151 lb)-76.8 kg (169 lb 5 oz)] 76.8 kg (169 lb 5 oz) (03/26 0400) INTAKE / OUTPUT: Intake/Output     03/25 0701 - 03/26 0700 03/26 0701 - 03/27 0700   I.V. (mL/kg) 1486.8 (19.4)    IV Piggyback 500    Total Intake(mL/kg) 1986.8 (25.9)    Urine (mL/kg/hr) 175    Total Output 175     Net +1811.8          Urine Occurrence 1 x    Stool Occurrence 2 x      PHYSICAL EXAMINATION: General: Ill appearing ,rt eye patch. HOH Neuro:  Alert, normal strength, follows  commands HEENT:  Patch over Rt eye, pupils reactive, dry oral mucosa, no LAN Cardiovascular:  Regular, no murmur Lungs:  No wheeze/rales Abdomen: mild distention, increased tympany, decreased bowel sounds, non tender Musculoskeletal:  No edema Skin:  No rashes  LABS:  CBC  Recent Labs Lab 10/15/13 1132 10/16/13 0310  WBC 18.7* 17.1*  HGB 12.3 12.1  HCT 36.3 36.8  PLT 50* 38*   Coag's No results found for this basename: APTT, INR,  in the last 168 hours  BMET  Recent Labs Lab 10/15/13 1132 10/16/13 0310  NA 135* 136*  K 3.0* 4.9  CL 94* 107  CO2 14* 13*  BUN 36* 38*  CREATININE 1.63* 1.43*  GLUCOSE 79 79   Electrolytes  Recent Labs Lab 10/15/13 1132 10/16/13 0310  CALCIUM 8.6 7.7*  MG  --  1.5   Sepsis Markers  Recent Labs Lab 10/15/13 1149 10/15/13 1428 10/16/13 0310  LATICACIDVEN 10.10*  --  4.7*  PROCALCITON  --  33.72 <0.10   ABG No results found for this basename: PHART, PCO2ART, PO2ART,  in the last 168 hours  Liver Enzymes  Recent Labs Lab 10/15/13 1132 10/16/13 0310  AST 78* 88*  ALT 77* 68*  ALKPHOS 144* 118*  BILITOT 4.1* 3.7*  ALBUMIN 2.9* 2.5*   Cardiac Enzymes No results found for this  basename: TROPONINI, PROBNP,  in the last 168 hours  Glucose No results found for this basename: GLUCAP,  in the last 168 hours  Imaging Ct Abdomen Pelvis Wo Contrast  10/15/2013   CLINICAL DATA:  Fever.  EXAM: CT ABDOMEN AND PELVIS WITHOUT CONTRAST  TECHNIQUE: Multidetector CT imaging of the abdomen and pelvis was performed following the standard protocol without intravenous contrast.  COMPARISON:  DG PELVIS PORTABLE dated 07/02/2011; CT L SPINE W/O CM dated 03/02/2010  FINDINGS: Prior cholecystectomy. Severe dilatation of the intrahepatic and extrahepatic biliary ducts. The common bile duct measures 2.2 cm in diameter. Common bile duct is distended to the level of the ampulla of Vater/ pancreatic head. A pancreatic head mass or ampullary mass  cannot be excluded. ERCP should be considered for further evaluation. Punctate calcifications in this region may be related to ductal calcification and/or vascular calcifications. No definite calcified stone is noted in the distal common bile duct. Liver difficult to evaluate without contrast. Lucencies noted most likely distended biliary ducts. Spleen normal. Pancreas normal.  No focal adrenal abnormality identified. 3.1 cm simple right renal cyst. 1.5 cm left renal cyst. No hydronephrosis or obstructing ureteral stone. The bladder is nondistended. Calcified uterine fibroids. Adnexa unremarkable. No free pelvic fluid.  No adenopathy. 4.1 x 4.1 infrarenal suprailiac abdominal aortic aneurysm is present. Ectasia of both common iliac arteries to 15 mm noted.  Prior right colonic surgery. Appendix is not definitely identified. Stool is present in the colon. Mild distention of small and large bowel noted. These findings suggest adynamic ileus. Follow-up abdominal series suggested. No site of bowel obstruction noted. Stool is present in the colon. Stomach is minimally distended. Hiatal hernia is present. No free air.  Cardiomegaly. Coronary artery disease. Cardiac pacer. Atelectasis in the lung bases. Small pleural effusions. Left hip prosthesis. Slight deformity right femoral neck noted. This may be from prior trauma. No acute fracture line is identified. If the patient has right hip symptoms, right hip series followup suggested .  Marland Kitchen  1. Severe intrahepatic and extrahepatic biliary ductal dilatation with the common bile duct measuring 2.2 cm. The common bile duct is distended to the level of the ampulla /pancreatic head. A pancreatic head mass/malignancy cannot be excluded. ERCP is suggested for further evaluation. 2. 4.1 x 4.1 infrarenal, suprailiac abdominal aortic aneurysm with bilateral common iliac artery ectasia. 3. Adynamic ileus. 4. Cardiomegaly.  Coronary artery disease. 5. Slight deformity right femoral neck,  this may be related to degenerative change and/or old trauma. No definite fracture identified. If patient is symptomatic in this region follow-up right hip series is suggested .   Electronically Signed   By: Maisie Fus  Register   On: 10/15/2013 17:15   Dg Chest Port 1 View  10/15/2013   CLINICAL DATA:  Fever and weakness  EXAM: PORTABLE CHEST - 1 VIEW  COMPARISON:  DG CHEST 1 VIEW dated 07/01/2011  FINDINGS: Low lung volumes with crowding of the central pulmonary markings. There is no focal parenchymal opacity, pleural effusion, or pneumothorax. The heart and mediastinal contours are unremarkable. There is a dual lead cardiac pacer in satisfactory position.Stable cardiomediastinal silhouette. Mild cardiomegaly.  IMPRESSION: No active disease.   Electronically Signed   By: Elige Ko   On: 10/15/2013 12:50   US Abdomen Limited Ruq  10/15/2013   CLINICAL DATA:  Sepsis, elevated LFTs  EXAM: US ABDOMEN LIMITED - RIGHT UPPER QUADRANT  COMPARISON:  None.  FINDINGS: Gallbladder:  Status post cholecystectomy.  Common bile duct:  Diameter: 20 mm. This is within normal limits given the post cholecystectomy state. It does taper distally into the region of the pancreatic head.  Liver:  Mild nodularity is noted which may be related to some underlying cirrhotic change.  IMPRESSION: Mild nodularity of the liver.  Status post cholecystectomy.  No acute abnormality is noted.   Electronically Signed   By: Alcide CleverMark  Lukens M.D.   On: 10/15/2013 16:43    ASSESSMENT / PLAN:  PULMONARY A: Atelectasis. P:   Monitor oxygen level Bronchial hygiene  CARDIOVASCULAR A:  Septic shock >> transient on pressors 3/25. Hx of HTN, complete heart block s/p PM. 4 cm infrarenal AAA. P:  Continue IV fluids Defer CVL placement Hold outpt ASA, diltiazem  RENAL   A:   Acute kidney injury 2nd to shock >> uncertain what baseline renal fx is. Hypokalemia. Anion gap acidosis 2nd to lactic acidosis. P:   Continue volume  resuscitation Monitor renal fx, urine outpt F/u and replace electrolytes as needed F/u lactic acid level Anion gap 20 GASTROINTESTINAL A:   Elevated LFT's with biliary dilation. Ileus. P:   NPO May need EUS or ERCP >> defer to GI F/u LFT's Protonix for SUP  HEMATOLOGIC A:  Thrombocytopenia in setting of sepsis. P:  F/u CBC SCD for DVT prevention  INFECTIOUS A:   Septic shock 2nd to UTI, and biliary dilation. GNR in urine P:   Day 2 meropenem vancomycin F/u procalcitonin(0.10)  ENDOCRINE A:   Hx of hypothyroidism. P:   Continue synthroid IV  NEUROLOGIC A:   Hx of depression. P:   PRN ativan IV for sleep Hold outpt xanax, celexa for now  [facility] LLCte[facility] Minor ACNP [facility] P[facility] Pager [phone] till 3 pm If no answer page [phone] 10/16/2013, 8:59 AM   STAFF NOTE GNR sepsis likely due to UIT. Off pressors. Talking to family. Appears stable. DNAR, DNAI but continue medical care. PCCM will sign off. Have ordered mag, phos and troponin check for completeness   Dr. Kalman ShanMurali Naomi Castrogiovanni, M.D., [facility] CenterF.C.C.P Pulmonary and Critical Care Medicine Staff Physician [facility] [place] [facility] Pager: [phone], If no answer or between  15:00h - 7:00h: call [phone]  10/16/2013 11:45 AM

## 2013-10-17 ENCOUNTER — Encounter (HOSPITAL_COMMUNITY)
Admission: EM | Disposition: A | Discharge status: Skilled Nursing Facility | Payer: Self-pay | Source: Home / Self Care | Attending: Internal Medicine

## 2013-10-17 ENCOUNTER — Inpatient Hospital Stay (HOSPITAL_COMMUNITY): Payer: Medicare Other | Admitting: Anesthesiology

## 2013-10-17 ENCOUNTER — Encounter (HOSPITAL_COMMUNITY): Payer: Medicare Other | Admitting: Anesthesiology

## 2013-10-17 LAB — PHOSPHORUS: PHOSPHORUS: 1.2 mg/dL — AB (ref 2.3–4.6)

## 2013-10-17 LAB — TROPONIN I: Troponin I: 2.22 ng/mL (ref <0.30)

## 2013-10-17 LAB — MAGNESIUM: MAGNESIUM: 1.6 mg/dL (ref 1.5–2.5)

## 2013-10-17 LAB — PRO B NATRIURETIC PEPTIDE: PRO B NATRI PEPTIDE: 28897 pg/mL — AB (ref 0–450)

## 2013-10-17 SURGERY — ERCP, WITH INTERVENTION IF INDICATED
Anesthesia: General

## 2013-10-17 SURGERY — ERCP, WITH INTERVENTION IF INDICATED
Anesthesia: Monitor Anesthesia Care

## 2013-10-17 MED ORDER — MORPHINE SULFATE 2 MG/ML IJ SOLN: 1.0000 mg | INTRAMUSCULAR | Status: DC | PRN

## 2013-10-17 MED ORDER — HALOPERIDOL LACTATE 5 MG/ML IJ SOLN: 1.0000 mg | Freq: Four times a day (QID) | INTRAMUSCULAR | Status: DC | PRN

## 2013-10-17 MED ORDER — CLOPIDOGREL BISULFATE 75 MG PO TABS
75.0000 mg | ORAL_TABLET | Freq: Every day | ORAL | Status: DC
  Administered 2013-10-17 – 2013-10-20 (×4): 75 mg via ORAL
  Filled 2013-10-17 (×4): qty 1

## 2013-10-17 MED ORDER — ALPRAZOLAM 0.25 MG PO TABS
0.2500 mg | ORAL_TABLET | Freq: Every evening | ORAL | Status: DC | PRN
  Administered 2013-10-17 – 2013-10-19 (×3): 0.25 mg via ORAL
  Filled 2013-10-17 (×3): qty 1

## 2013-10-17 MED ORDER — PANTOPRAZOLE SODIUM 40 MG PO TBEC
40.0000 mg | DELAYED_RELEASE_TABLET | Freq: Every day | ORAL | Status: DC
  Administered 2013-10-17 – 2013-10-20 (×4): 40 mg via ORAL
  Filled 2013-10-17 (×4): qty 1

## 2013-10-17 NOTE — Progress Notes (Signed)
Course of events over the past 24 hours noted.  ERCP cancelled.  Will sign-off; please call us back as needed.

## 2013-10-17 NOTE — Progress Notes (Signed)
PHARMACY BRIEF NOTE:  PROTONIX IV TO PO  The patient is receiving Protonix by the intravenous route.  Protonix IV is in critically short supply.  Because the patient has at least one oral medication charted today, based on emergency criteria approved by the Pharmacy and Therapeutics Committee Chairman the Protonix has been converted to PO.  If there are questions about this conversion, please contact the Pharmacy Department (phone [phone]).    Thank you. Elie Goodyandy Leonell Lobdell, PharmD  [phone]

## 2013-10-17 NOTE — Progress Notes (Signed)
[facility]                                                                                    Patient Demographics  [name], is a 80 y.o. female, DOB - 1917-04-06, WJX:[id number]  Admit date - 10/15/2013   Admitting Physician Leroy SeaPrashant K Dasja Brase, MD  Outpatient Primary MD for the patient is Pamelia HoitWILSON,FRED HENRY, MD  LOS - 2   Chief Complaint  Patient presents with  . Fever  . Weakness        Assessment & Plan    1. Septic shock. Now cleared to focus is biliary obstruction with ascending cholangitis. She status post cholecystectomy in the past, this could represent a pancreatic mass compressing on the CBD. GI on board or possible ERCP for now continue aggressive IV fluids, IV bicarbonate drip, IV antibiotics which will be tapered down to meropenem only for now, follow blood culture.   Focus of care to comfort with gentle medical treatment if further declines full comfort care, palliative care consulted, discussed with patient's daughter [name].    2. Acute renal failure due to #1 above, IV fluids to hydrate and monitor.      3. Elevated liver enzymes with elevated total bilirubin. Secondary to #1 above     4. Hypothyroidism. Place on IV Synthroid and monitor.      5. GERD. IV PPI.      6. Hypokalemia. Replaced and monitor.      7. Thrombocytopenia. Likely due to sepsis and marrow suppression, no active bleeding. Will monitor closely. We'll avoid Lovenox heparin for now.     8. Severe metabolic acidosis due to septic shock. Supportive care only as in #1.     9.NSTEMI - discussed with patient's daughter [name], at this time aggressive care not desired, allergic to aspirin, chest pain-free, placed on Plavix, no beta blocker as blood pressure is low from sepsis, no statin as liver enzymes are elevated, goal of care will be comfort with gentle medical treatment on board. Continue Plavix along with supportive care.      Code Status: No intubation or  CPR, condition remains extremely guarded. Palliative care consulted.  Family Communication: Discussed with daughter-in-law and sister  Disposition Plan: Remains in ICU will eventually need SNF   Procedures CT scan abdomen pelvis, right upper quadrant ultrasound, possible ERCP   Consults  GI, PCCM, palliative care   Medications  Scheduled Meds: . clopidogrel  75 mg Oral Daily  . levothyroxine  50 mcg Intravenous Daily  . meropenem (MERREM) IV  500 mg Intravenous Q12H  . pantoprazole (PROTONIX) IV  40 mg Intravenous QHS  . sodium chloride  3 mL Intravenous Q12H   Continuous Infusions:   PRN Meds:.haloperidol lactate, morphine injection, ondansetron (ZOFRAN) IV  DVT Prophylaxis   SCDs    Lab Results  Component Value Date   PLT 38* 10/16/2013    Antibiotics    Anti-infectives   Start     Dose/Rate Route Frequency Ordered Stop   10/17/13 1200  vancomycin (VANCOCIN) IVPB 1000 mg/200 mL premix  Status:  Discontinued     1,000 mg 200 mL/hr over 60 Minutes Intravenous Every  48 hours 10/15/13 1243 10/17/13 0744   10/16/13 1200  ceFEPIme (MAXIPIME) 1 g in dextrose 5 % 50 mL IVPB  Status:  Discontinued     1 g 100 mL/hr over 30 Minutes Intravenous Every 24 hours 10/15/13 1243 10/15/13 1405   10/15/13 1500  meropenem (MERREM) 500 mg in sodium chloride 0.9 % 50 mL IVPB     500 mg 100 mL/hr over 30 Minutes Intravenous Every 12 hours 10/15/13 1405     10/15/13 1200  ceFEPIme (MAXIPIME) 2 g in dextrose 5 % 50 mL IVPB     2 g 100 mL/hr over 30 Minutes Intravenous STAT 10/15/13 1146 10/15/13 1227   10/15/13 1145  vancomycin (VANCOCIN) IVPB 1000 mg/200 mL premix     1,000 mg 200 mL/hr over 60 Minutes Intravenous  Once 10/15/13 1137 10/15/13 1257          Subjective:   [name] today has, No headache, No chest pain, No abdominal pain - No Nausea, No new weakness tingling or numbness, No Cough - SOB.    Objective:   Filed Vitals:   10/16/13 2131 10/17/13 0000 10/17/13  0400 10/17/13 0700  BP: 158/68 116/59 120/68   Pulse: 96 81 82   Temp:  98.7 F (37.1 C) 97.5 F (36.4 C) 98.9 F (37.2 C)  TempSrc:  Oral Oral Oral  Resp: 22 25 26    Height:      Weight:   79.5 kg (175 lb 4.3 oz)   SpO2: 94% 94% 93%     Wt Readings from Last 3 Encounters:  10/17/13 79.5 kg (175 lb 4.3 oz)  10/17/13 79.5 kg (175 lb 4.3 oz)  10/17/13 79.5 kg (175 lb 4.3 oz)     Intake/Output Summary (Last 24 hours) at 10/17/13 1039 Last data filed at 10/17/13 0800  Gross per 24 hour  Intake 2795.83 ml  Output    501 ml  Net 2294.83 ml    Exam Awake Alert, Oriented X 3, No new F.N deficits, Normal affect, 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  Symmetrical Chest wall movement, Good air movement bilaterally, CTAB RRR,No Gallops,Rubs or new Murmurs, No Parasternal Heave +ve B.Sounds, Abd Soft, Non tender, No organomegaly appriciated, No rebound - guarding or rigidity. No Cyanosis, Clubbing or edema, No new Rash or bruise     Data Review   Micro Results Recent Results (from the past 240 hour(s))  CULTURE, BLOOD (ROUTINE X 2)     Status: None   Collection Time    10/15/13 11:32 AM      Result Value Ref Range Status   Specimen Description BLOOD RIGHT WRIST   Final   Special Requests BOTTLES DRAWN AEROBIC AND ANAEROBIC 5 CC EA   Final   Culture  Setup Time     Final   Value: 10/15/2013 14:17     Performed at [facility]   Culture     Final   Value: ESCHERICHIA COLI     Note: Gram Stain Report Called to,Read Back By and Verified With: MINDY HOPPER 10/16/13 AT 0135 RIDK     Performed at [facility]   Report Status PENDING   Incomplete  CULTURE, BLOOD (ROUTINE X 2)     Status: None   Collection Time    10/15/13 11:45 AM      Result Value Ref Range Status   Specimen Description BLOOD LEFT ANTECUBITAL   Final   Special  Requests BOTTLES DRAWN AEROBIC AND ANAEROBIC   Final   Culture   Setup Time     Final   Value: 10/15/2013 14:17     Performed at [facility]   Culture     Final   Value: ESCHERICHIA COLI     Note: Gram Stain Report Called to,Read Back By and Verified With: MINDY HOPPER 10/16/13 AT 0135 RIDK     Performed at [facility]   Report Status PENDING   Incomplete  URINE CULTURE     Status: None   Collection Time    10/15/13 11:51 AM      Result Value Ref Range Status   Specimen Description URINE, CATHETERIZED   Final   Special Requests NONE   Final   Culture  Setup Time     Final   Value: 10/15/2013 14:31     Performed at [facility] Count     Final   Value: NO GROWTH     Performed at [facility]   Culture     Final   Value: NO GROWTH     Performed at [facility]   Report Status 10/16/2013 FINAL   Final  MRSA PCR SCREENING     Status: None   Collection Time    10/15/13  2:21 PM      Result Value Ref Range Status   MRSA by PCR NEGATIVE  NEGATIVE Final   Comment:            The GeneXpert MRSA Assay (FDA     approved for NASAL specimens     only), is one component of a     comprehensive MRSA colonization     surveillance program. It is not     intended to diagnose MRSA     infection nor to guide or     monitor treatment for     MRSA infections.    Radiology Reports Ct Abdomen Pelvis Wo Contrast  10/15/2013   CLINICAL DATA:  Fever.  EXAM: CT ABDOMEN AND PELVIS WITHOUT CONTRAST  TECHNIQUE: Multidetector CT imaging of the abdomen and pelvis was performed following the standard protocol without intravenous contrast.  COMPARISON:  DG PELVIS PORTABLE dated 07/02/2011; CT L SPINE W/O CM dated 03/02/2010  FINDINGS: Prior cholecystectomy. Severe dilatation of the intrahepatic and extrahepatic biliary ducts. The common bile duct measures 2.2 cm in diameter. Common bile duct is distended to the level of the ampulla of Vater/ pancreatic head. A pancreatic head mass or ampullary mass cannot be excluded. ERCP  should be considered for further evaluation. Punctate calcifications in this region may be related to ductal calcification and/or vascular calcifications. No definite calcified stone is noted in the distal common bile duct. Liver difficult to evaluate without contrast. Lucencies noted most likely distended biliary ducts. Spleen normal. Pancreas normal.  No focal adrenal abnormality identified. 3.1 cm simple right renal cyst. 1.5 cm left renal cyst. No hydronephrosis or obstructing ureteral stone. The bladder is nondistended. Calcified uterine fibroids. Adnexa unremarkable. No free pelvic fluid.  No adenopathy. 4.1 x 4.1 infrarenal suprailiac abdominal aortic aneurysm is present. Ectasia of both common iliac arteries to 15 mm noted.  Prior right colonic surgery. Appendix is not definitely identified. Stool is present in the colon. Mild distention of small and large bowel noted. These findings suggest adynamic ileus. Follow-up abdominal series suggested. No site of bowel obstruction noted. Stool is present in the colon. Stomach is minimally  distended. Hiatal hernia is present. No free air.  Cardiomegaly. Coronary artery disease. Cardiac pacer. Atelectasis in the lung bases. Small pleural effusions. Left hip prosthesis. Slight deformity right femoral neck noted. This may be from prior trauma. No acute fracture line is identified. If the patient has right hip symptoms, right hip series followup suggested .  Marland Kitchen  1. Severe intrahepatic and extrahepatic biliary ductal dilatation with the common bile duct measuring 2.2 cm. The common bile duct is distended to the level of the ampulla /pancreatic head. A pancreatic head mass/malignancy cannot be excluded. ERCP is suggested for further evaluation. 2. 4.1 x 4.1 infrarenal, suprailiac abdominal aortic aneurysm with bilateral common iliac artery ectasia. 3. Adynamic ileus. 4. Cardiomegaly.  Coronary artery disease. 5. Slight deformity right femoral neck, this may be related to  degenerative change and/or old trauma. No definite fracture identified. If patient is symptomatic in this region follow-up right hip series is suggested .   Electronically Signed   By: Maisie Fus  Register   On: 10/15/2013 17:15   Dg Chest Port 1 View  10/15/2013   CLINICAL DATA:  Fever and weakness  EXAM: PORTABLE CHEST - 1 VIEW  COMPARISON:  DG CHEST 1 VIEW dated 07/01/2011  FINDINGS: Low lung volumes with crowding of the central pulmonary markings. There is no focal parenchymal opacity, pleural effusion, or pneumothorax. The heart and mediastinal contours are unremarkable. There is a dual lead cardiac pacer in satisfactory position.Stable cardiomediastinal silhouette. Mild cardiomegaly.  IMPRESSION: No active disease.   Electronically Signed   By: Elige Ko   On: 10/15/2013 12:50   US Abdomen Limited Ruq  10/15/2013   CLINICAL DATA:  Sepsis, elevated LFTs  EXAM: US ABDOMEN LIMITED - RIGHT UPPER QUADRANT  COMPARISON:  None.  FINDINGS: Gallbladder:  Status post cholecystectomy.  Common bile duct:  Diameter: 20 mm. This is within normal limits given the post cholecystectomy state. It does taper distally into the region of the pancreatic head.  Liver:  Mild nodularity is noted which may be related to some underlying cirrhotic change.  IMPRESSION: Mild nodularity of the liver.  Status post cholecystectomy.  No acute abnormality is noted.   Electronically Signed   By: Alcide Clever M.D.   On: 10/15/2013 16:43    CBC  Recent Labs Lab 10/15/13 1132 10/16/13 0310  WBC 18.7* 17.1*  HGB 12.3 12.1  HCT 36.3 36.8  PLT 50* 38*  MCV 86.4 87.0  MCH 29.3 28.6  MCHC 33.9 32.9  RDW 13.8 14.4  LYMPHSABS 0.7  --   MONOABS 0.7  --   EOSABS 0.0  --   BASOSABS 0.0  --     Chemistries   Recent Labs Lab 10/15/13 1132 10/16/13 0310 10/16/13 1155 10/17/13 0315  NA 135* 136*  --   --   K 3.0* 4.9  --   --   CL 94* 107  --   --   CO2 14* 13*  --   --   GLUCOSE 79 79  --   --   BUN 36* 38*  --   --    CREATININE 1.63* 1.43*  --   --   CALCIUM 8.6 7.7*  --   --   MG  --  1.5  --  1.6  AST 78* 88* 83*  --   ALT 77* 68* 67*  --   ALKPHOS 144* 118* 120*  --   BILITOT 4.1* 3.7* 3.2*  --    ------------------------------------------------------------------------------------------------------------------ estimated creatinine  clearance is 24.5 ml/min (by C-G formula based on Cr of 1.43). ------------------------------------------------------------------------------------------------------------------ No results found for this basename: HGBA1C,  in the last 72 hours ------------------------------------------------------------------------------------------------------------------ No results found for this basename: CHOL, HDL, LDLCALC, TRIG, CHOLHDL, LDLDIRECT,  in the last 72 hours ------------------------------------------------------------------------------------------------------------------ No results found for this basename: TSH, T4TOTAL, FREET3, T3FREE, THYROIDAB,  in the last 72 hours ------------------------------------------------------------------------------------------------------------------ No results found for this basename: VITAMINB12, FOLATE, FERRITIN, TIBC, IRON, RETICCTPCT,  in the last 72 hours  Coagulation profile  Recent Labs Lab 10/16/13 1329  INR 1.47    No results found for this basename: DDIMER,  in the last 72 hours  Cardiac Enzymes  Recent Labs Lab 10/17/13 0315  TROPONINI 2.22*   ------------------------------------------------------------------------------------------------------------------ No components found with this basename: POCBNP,       Total Critical Care time in examining the patient bedside, evaluating Lab work and other data, over half of the total time was spent in coordinating patient care on the floor or bedisde in talking to patient/family members, communicating with nursing Staff on the floor and sub specialists GI  to coordinate  patients medical care and needs is  45 Minutes.   The condition which has caused critical injury/acute impairment of CVS vital organ system with a high probability of sudden clinically significant deterioration and can cause Potential Life threatening injury to this patient addressed today is  septic shock with hypotension, acute renal failure, metabolic acidosis requiring IV fluids for resuscitation, IV bicarbonate drip.  See all Orders from today for further details     Leroy Sea M.D on 10/17/2013 at 10:39 AM  Between 7am to 7pm - Pager - [phone]  After 7pm go to [email or web address] - password [email or web address]  And look for the night coverage person covering for me after hours  [facility] Office  [phone]

## 2013-10-17 NOTE — Progress Notes (Signed)
CRITICAL VALUE ALERT  Critical value received:  Troponin 2.22  Date of notification: 10/17/13  Time of notification:  04:20  Critical value read back:yes  Nurse who received alert:  Cruzita LedererMike Raquon Milledge RN  MD notified (1st page):  Donnamarie PoagK. Kirby NP  Time of first page:  [phone]

## 2013-10-17 NOTE — Anesthesia Preprocedure Evaluation (Deleted)
Anesthesia Evaluation  Patient identified by MRN, date of birth, ID band Patient awake    Reviewed: Allergy & Precautions, H&P , NPO status , Patient's Chart, lab work & pertinent test results  Airway Mallampati: II TM Distance: >3 FB Neck ROM: Limited    Dental no notable dental hx.    Pulmonary former smoker,  breath sounds clear to auscultation  Pulmonary exam normal       Cardiovascular hypertension, Pt. on medications + pacemaker + Valvular Problems/Murmurs AS Rhythm:Regular Rate:Normal  EF was 35%. Aortic valve: peak gradient of 11mm Hg and mean gradient of 6mm Hg  Aortic valve:   Mildly thickened, mildly calcified leaflets.  Doppler:   There was mild stenosis.    Trivial regurgitation.    VTI ratio of LVOT to aortic valve: 0.57. Valve area: 2.37cm^2(VTI). Indexed valve area: 1.29cm^2/m^2    Neuro/Psych negative neurological ROS  negative psych ROS   GI/Hepatic negative GI ROS, Neg liver ROS,   Endo/Other  Hypothyroidism   Renal/GU negative Renal ROS  negative genitourinary   Musculoskeletal negative musculoskeletal ROS (+)   Abdominal   Peds negative pediatric ROS (+)  Hematology negative hematology ROS (+)   Anesthesia Other Findings   Reproductive/Obstetrics negative OB ROS                         Anesthesia Physical Anesthesia Plan  ASA: III  Anesthesia Plan: General   Post-op Pain Management:    Induction: Intravenous  Airway Management Planned: Oral ETT  Additional Equipment:   Intra-op Plan:   Post-operative Plan: Extubation in OR  Informed Consent: I have reviewed the patients History and Physical, chart, labs and discussed the procedure including the risks, benefits and alternatives for the proposed anesthesia with the patient or authorized representative who has indicated his/her understanding and acceptance.   Dental advisory given  Plan Discussed with:  CRNA and Surgeon  Anesthesia Plan Comments:         Anesthesia Quick Evaluation

## 2013-10-17 NOTE — Progress Notes (Addendum)
Elevated Troponin result of 2.22 called to NP by RN, Kathlene NovemberMike. 12 lead EKG done to compare to old one. Pt is paced, so difficult to discern, but there appears to be ST depression on this new EKG (reviewed with Dr. Allena KatzPatel of [facility]). Pt is not a reliable historian and can not tell us her symptoms. She is confused and disoriented. Her ProBNP also came back very elevated as well.  Given this new finding of likely NSTEMI (or at least serious heart strain/demand ischemia from her septic shock/CHF, etc), this NP called her grandson, Lanier ClamDavid Benham, who is her closest living relative.  Pt was to have an ERCP today and Onalee HuaDavid had consented to this even though cautioned on the risks. However, given this new knowledge of heart strain, this NP advised against any further procedures. This NP explained to Onalee HuaDavid that we could continue to keep treating her medically and add Heparin/anticoagulation for this NSTEMI. However, this NP explained that heparin carries risks of bleeding and is not a "cure" either. This NP explained that even if cardiology called, his grandmother would not be a candidate for any procedure such as a cardiac cath/stenting. He agrees with that. Given this new info, we discussed the ERCP further, and he now sees where this is not prudent either. He reiterates that his grandmother did not ever want to be on life support. This NP explained that once the ETT was placed for the procedure that we would likely not be able to remove it for pt to breathe on her own and then she would be on life support. After this discussion, Onalee HuaDavid wishes to have some time to call other family members for support on a decision. He asked this NP to call him back in about 30 minutes to discuss decision.  All of the above was discussed with Dr. Allena KatzPatel of [facility] who agrees with this plan. Jimmye NormanKaren Kirby-Graham, NP [facility] Addendum: This NP spoke to son again around 0600. He had spoken with pt's sister about situation. Onalee Huaavid and pt's  sister, do not want any further procedures, tests, labs, etc. Stating, "just keep her comfortable and let nature take it's course, she has been through enough". Explained Morphine for comfort and Onalee HuaDavid agrees. Explained can use other meds as she progresses towards death to make her comfortable, calm, etc. Will leave antibiotics for now, but can consider stopping those as well if she doesn't progress any further.  ERCP cancelled. No CPR, no pressors, no antiarrythmic meds or cardioversion, No ETT intubation or ventilator support.  Jimmye NormanKaren Kirby-Graham, NP [facility]

## 2013-10-18 LAB — CULTURE, BLOOD (ROUTINE X 2)

## 2013-10-18 MED ORDER — CEFAZOLIN SODIUM 1-5 GM-% IV SOLN
1.0000 g | Freq: Two times a day (BID) | INTRAVENOUS | Status: DC
Start: 2013-10-18 — End: 2013-10-20
  Administered 2013-10-18 – 2013-10-20 (×4): 1 g via INTRAVENOUS
  Filled 2013-10-18 (×5): qty 50

## 2013-10-18 NOTE — Progress Notes (Signed)
Pharmacist Heart Failure Core Measure Documentation  Assessment: [name] has an EF documented as 30-35% on 03/02/2010 by ECHO.  Rationale: Heart failure patients with left ventricular systolic dysfunction (LVSD) and an EF < 40% should be prescribed an angiotensin converting enzyme inhibitor (ACEI) or angiotensin receptor blocker (ARB) at discharge unless a contraindication is documented in the medical record.  This patient is not currently on an ACEI or ARB for HF.  This note is being placed in the record in order to provide documentation that a contraindication to the use of these agents is present for this encounter.  ACE Inhibitor or Angiotensin Receptor Blocker is contraindicated (specify all that apply)  []   ACEI allergy AND ARB allergy []   Angioedema []   Moderate or severe aortic stenosis []   Hyperkalemia []   Hypotension []   Renal artery stenosis [x]   Worsening renal function, preexisting renal disease or dysfunction   [name], [name] 10/18/2013 1:07 PM

## 2013-10-18 NOTE — Progress Notes (Signed)
[facility]                                                                                    Patient Demographics  [name], is a 80 y.o. female, DOB - October 07, 1916, WUJ:[id number]  Admit date - 10/15/2013   Admitting Physician Leroy Sea, MD  Outpatient Primary MD for the patient is [name], MD  LOS - 3   Chief Complaint  Patient presents with  . Fever  . 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 Patient and family and now want gentle medical treatment to continue on which she seems to be gradually improving, if in the next few days she does any clinical decline family and patient are open for hospice.    Assessment & Plan     1. Septic shock. Due to Ascending cholangitis. She status post cholecystectomy in the past, this could represent a pancreatic mass compressing on the CBD. Due to patient's advanced age and baseline frail status, focus of care to comfort with gentle medical treatment if further declines full comfort care, palliative care consulted, discussed with patient's daughter [name]. Continue antibiotics and bowel rest for now, clear liquids only today.    2. Acute renal failure due to #1 above, IV fluids to hydrate and monitor.      3. Elevated liver enzymes with elevated total bilirubin. Secondary to #1 above     4. Hypothyroidism. Place on IV Synthroid and monitor.      5. GERD. IV PPI.      6. Hypokalemia. Replaced and monitor.      7. Thrombocytopenia. Likely due to sepsis and marrow suppression, no active bleeding. Will monitor closely. We'll avoid Lovenox heparin for now.     8. Severe metabolic acidosis due to septic shock. Supportive  care only as in #1.     9.NSTEMI - discussed with patient's daughter [name], at this time aggressive care not desired, allergic to aspirin, chest pain-free, placed on Plavix, no beta blocker as blood pressure is low from sepsis, no statin as liver enzymes are elevated, goal of care will be comfort with gentle medical treatment on board. Continue Plavix along with supportive care.      Code Status: No intubation or CPR, condition remains extremely guarded. Palliative care consulted.  Family Communication: Discussed with daughter-in-law and sister  Disposition Plan: Remains in ICU will eventually need SNF   Procedures CT scan abdomen pelvis, right upper quadrant ultrasound, possible ERCP   Consults  GI, PCCM, palliative care   Medications  Scheduled Meds: . clopidogrel  75 mg Oral Daily  . levothyroxine  50 mcg Intravenous Daily  . meropenem (MERREM) IV  500 mg Intravenous Q12H  . pantoprazole  40 mg Oral Daily  . sodium chloride  3 mL Intravenous Q12H   Continuous Infusions:   PRN Meds:.ALPRAZolam, haloperidol lactate, morphine injection, ondansetron (ZOFRAN) IV  DVT Prophylaxis   SCDs  Lab Results  Component Value Date   PLT 38* 10/16/2013    Antibiotics    Anti-infectives   Start     Dose/Rate Route Frequency Ordered Stop   10/17/13 1200  vancomycin (VANCOCIN) IVPB 1000 mg/200 mL premix  Status:  Discontinued     1,000 mg 200 mL/hr over 60 Minutes Intravenous Every 48 hours 10/15/13 1243 10/17/13 0744   10/16/13 1200  ceFEPIme (MAXIPIME) 1 g in dextrose 5 % 50 mL IVPB  Status:  Discontinued     1 g 100 mL/hr over 30 Minutes Intravenous Every 24 hours 10/15/13 1243 10/15/13 1405   10/15/13 1500  meropenem (MERREM) 500 mg in sodium chloride 0.9 % 50 mL IVPB     500 mg 100 mL/hr over 30 Minutes Intravenous Every 12 hours 10/15/13 1405     10/15/13 1200  ceFEPIme (MAXIPIME) 2 g in dextrose 5 % 50 mL IVPB     2 g 100 mL/hr over 30 Minutes Intravenous STAT  10/15/13 1146 10/15/13 1227   10/15/13 1145  vancomycin (VANCOCIN) IVPB 1000 mg/200 mL premix     1,000 mg 200 mL/hr over 60 Minutes Intravenous  Once 10/15/13 1137 10/15/13 1257          Subjective:   [name] today has, No headache, No chest pain, No abdominal pain - No Nausea, No new weakness tingling or numbness, No Cough - SOB.    Objective:   Filed Vitals:   10/17/13 1056 10/17/13 1425 10/17/13 2100 10/18/13 0530  BP: 128/70 120/62 134/72 128/72  Pulse: 77 80 96 75  Temp: 98.6 F (37 C) 98 F (36.7 C) 97.8 F (36.6 C) 98.6 F (37 C)  TempSrc: Oral Oral Oral Oral  Resp: 22 20 28 24   Height:      Weight:      SpO2: 95% 95% 96% 96%    Wt Readings from Last 3 Encounters:  10/17/13 79.5 kg (175 lb 4.3 oz)  10/17/13 79.5 kg (175 lb 4.3 oz)  10/17/13 79.5 kg (175 lb 4.3 oz)     Intake/Output Summary (Last 24 hours) at 10/18/13 0818 Last data filed at 10/18/13 0725  Gross per 24 hour  Intake      0 ml  Output    750 ml  Net   -750 ml    Exam Awake Alert, Oriented X 3, No new F.N deficits, Normal affect, chronic blindness  in right eye [place].AT,PERRAL Supple Neck,No JVD, No cervical lymphadenopathy appriciated.  Symmetrical Chest wall movement, Good air movement bilaterally, CTAB RRR,No Gallops,Rubs or new Murmurs, No Parasternal Heave +ve B.Sounds, Abd Soft, Non tender, No organomegaly appriciated, No rebound - guarding or rigidity. No Cyanosis, Clubbing or edema, No new Rash or bruise     Data Review   Micro Results Recent Results (from the past 240 hour(s))  CULTURE, BLOOD (ROUTINE X 2)     Status: None   Collection Time    10/15/13 11:32 AM      Result Value Ref Range Status   Specimen Description BLOOD RIGHT WRIST   Final   Special Requests BOTTLES DRAWN AEROBIC AND ANAEROBIC 5 CC EA   Final   Culture  Setup Time     Final   Value: 10/15/2013 14:17     Performed at [facility]   Culture     Final   Value: ESCHERICHIA COLI     Note:  Gram Stain Report Called to,Read Back By and Verified With: MINDY HOPPER  10/16/13 AT 0135 RIDK     Performed at [facility]   Report Status PENDING   Incomplete  CULTURE, BLOOD (ROUTINE X 2)     Status: None   Collection Time    10/15/13 11:45 AM      Result Value Ref Range Status   Specimen Description BLOOD LEFT ANTECUBITAL   Final   Special Requests BOTTLES DRAWN AEROBIC AND ANAEROBIC   Final   Culture  Setup Time     Final   Value: 10/15/2013 14:17     Performed at [facility]   Culture     Final   Value: ESCHERICHIA COLI     Note: Gram Stain Report Called to,Read Back By and Verified With: MINDY HOPPER 10/16/13 AT 0135 RIDK     Performed at [facility]   Report Status PENDING   Incomplete  URINE CULTURE     Status: None   Collection Time    10/15/13 11:51 AM      Result Value Ref Range Status   Specimen Description URINE, CATHETERIZED   Final   Special Requests NONE   Final   Culture  Setup Time     Final   Value: 10/15/2013 14:31     Performed at [facility] Count     Final   Value: NO GROWTH     Performed at [facility]   Culture     Final   Value: NO GROWTH     Performed at [facility]   Report Status 10/16/2013 FINAL   Final  MRSA PCR SCREENING     Status: None   Collection Time    10/15/13  2:21 PM      Result Value Ref Range Status   MRSA by PCR NEGATIVE  NEGATIVE Final   Comment:            The GeneXpert MRSA Assay (FDA     approved for NASAL specimens     only), is one component of a     comprehensive MRSA colonization     surveillance program. It is not     intended to diagnose MRSA     infection nor to guide or     monitor treatment for     MRSA infections.    Radiology Reports Ct Abdomen Pelvis Wo Contrast  10/15/2013   CLINICAL DATA:  Fever.  EXAM: CT ABDOMEN AND PELVIS WITHOUT CONTRAST  TECHNIQUE: Multidetector CT imaging of the abdomen and pelvis was performed following the standard  protocol without intravenous contrast.  COMPARISON:  DG PELVIS PORTABLE dated 07/02/2011; CT L SPINE W/O CM dated 03/02/2010  FINDINGS: Prior cholecystectomy. Severe dilatation of the intrahepatic and extrahepatic biliary ducts. The common bile duct measures 2.2 cm in diameter. Common bile duct is distended to the level of the ampulla of Vater/ pancreatic head. A pancreatic head mass or ampullary mass cannot be excluded. ERCP should be considered for further evaluation. Punctate calcifications in this region may be related to ductal calcification and/or vascular calcifications. No definite calcified stone is noted in the distal common bile duct. Liver difficult to evaluate without contrast. Lucencies noted most likely distended biliary ducts. Spleen normal. Pancreas normal.  No focal adrenal abnormality identified. 3.1 cm simple right renal cyst. 1.5 cm left renal cyst. No hydronephrosis or obstructing ureteral stone. The bladder is nondistended. Calcified uterine fibroids. Adnexa unremarkable. No free pelvic fluid.  No adenopathy. 4.1 x 4.1  infrarenal suprailiac abdominal aortic aneurysm is present. Ectasia of both common iliac arteries to 15 mm noted.  Prior right colonic surgery. Appendix is not definitely identified. Stool is present in the colon. Mild distention of small and large bowel noted. These findings suggest adynamic ileus. Follow-up abdominal series suggested. No site of bowel obstruction noted. Stool is present in the colon. Stomach is minimally distended. Hiatal hernia is present. No free air.  Cardiomegaly. Coronary artery disease. Cardiac pacer. Atelectasis in the lung bases. Small pleural effusions. Left hip prosthesis. Slight deformity right femoral neck noted. This may be from prior trauma. No acute fracture line is identified. If the patient has right hip symptoms, right hip series followup suggested .  Marland Kitchen.  1. Severe intrahepatic and extrahepatic biliary ductal dilatation with the common bile duct  measuring 2.2 cm. The common bile duct is distended to the level of the ampulla /pancreatic head. A pancreatic head mass/malignancy cannot be excluded. ERCP is suggested for further evaluation. 2. 4.1 x 4.1 infrarenal, suprailiac abdominal aortic aneurysm with bilateral common iliac artery ectasia. 3. Adynamic ileus. 4. Cardiomegaly.  Coronary artery disease. 5. Slight deformity right femoral neck, this may be related to degenerative change and/or old trauma. No definite fracture identified. If patient is symptomatic in this region follow-up right hip series is suggested .   Electronically Signed   By: Maisie Fushomas  Register   On: 10/15/2013 17:15   Dg Chest Port 1 View  10/15/2013   CLINICAL DATA:  Fever and weakness  EXAM: PORTABLE CHEST - 1 VIEW  COMPARISON:  DG CHEST 1 VIEW dated 07/01/2011  FINDINGS: Low lung volumes with crowding of the central pulmonary markings. There is no focal parenchymal opacity, pleural effusion, or pneumothorax. The heart and mediastinal contours are unremarkable. There is a dual lead cardiac pacer in satisfactory position.Stable cardiomediastinal silhouette. Mild cardiomegaly.  IMPRESSION: No active disease.   Electronically Signed   By: Elige KoHetal  Patel   On: 10/15/2013 12:50   [place] Abdomen Limited Ruq  10/15/2013   CLINICAL DATA:  Sepsis, elevated LFTs  EXAM: US ABDOMEN LIMITED - RIGHT UPPER QUADRANT  COMPARISON:  None.  FINDINGS: Gallbladder:  Status post cholecystectomy.  Common bile duct:  Diameter: 20 mm. This is within normal limits given the post cholecystectomy state. It does taper distally into the region of the pancreatic head.  Liver:  Mild nodularity is noted which may be related to some underlying cirrhotic change.  IMPRESSION: Mild nodularity of the liver.  Status post cholecystectomy.  No acute abnormality is noted.   Electronically Signed   By: Alcide CleverMark  Lukens M.D.   On: 10/15/2013 16:43    CBC  Recent Labs Lab 10/15/13 1132 10/16/13 0310  WBC 18.7* 17.1*  HGB 12.3  12.1  HCT 36.3 36.8  PLT 50* 38*  MCV 86.4 87.0  MCH 29.3 28.6  MCHC 33.9 32.9  RDW 13.8 14.4  LYMPHSABS 0.7  --   MONOABS 0.7  --   EOSABS 0.0  --   BASOSABS 0.0  --     Chemistries   Recent Labs Lab 10/15/13 1132 10/16/13 0310 10/16/13 1155 10/17/13 0315  NA 135* 136*  --   --   K 3.0* 4.9  --   --   CL 94* 107  --   --   CO2 14* 13*  --   --   GLUCOSE 79 79  --   --   BUN 36* 38*  --   --   CREATININE  1.63* 1.43*  --   --   CALCIUM 8.6 7.7*  --   --   MG  --  1.5  --  1.6  AST 78* 88* 83*  --   ALT 77* 68* 67*  --   ALKPHOS 144* 118* 120*  --   BILITOT 4.1* 3.7* 3.2*  --    ------------------------------------------------------------------------------------------------------------------ estimated creatinine clearance is 24.5 ml/min (by C-G formula based on Cr of 1.43). ------------------------------------------------------------------------------------------------------------------ No results found for this basename: HGBA1C,  in the last 72 hours ------------------------------------------------------------------------------------------------------------------ No results found for this basename: CHOL, HDL, LDLCALC, TRIG, CHOLHDL, LDLDIRECT,  in the last 72 hours ------------------------------------------------------------------------------------------------------------------ No results found for this basename: TSH, T4TOTAL, FREET3, T3FREE, THYROIDAB,  in the last 72 hours ------------------------------------------------------------------------------------------------------------------ No results found for this basename: VITAMINB12, FOLATE, FERRITIN, TIBC, IRON, RETICCTPCT,  in the last 72 hours  Coagulation profile  Recent Labs Lab 10/16/13 1329  INR 1.47    No results found for this basename: DDIMER,  in the last 72 hours  Cardiac Enzymes  Recent Labs Lab 10/17/13 0315  TROPONINI 2.22*    ------------------------------------------------------------------------------------------------------------------ No components found with this basename: POCBNP,       Total Critical Care time in examining the patient bedside, evaluating Lab work and other data, over half of the total time was spent in coordinating patient care on the floor or bedisde in talking to patient/family members, communicating with nursing Staff on the floor and sub specialists GI  to coordinate patients medical care and needs is  45 Minutes.   The condition which has caused critical injury/acute impairment of CVS vital organ system with a high probability of sudden clinically significant deterioration and can cause Potential Life threatening injury to this patient addressed today is  septic shock with hypotension, acute renal failure, metabolic acidosis requiring IV fluids for resuscitation, IV bicarbonate drip.  See all Orders from today for further details     Leroy Sea M.D on 10/18/2013 at 8:18 AM  Between 7am to 7pm - Pager - [phone]  After 7pm go to [email or web address] - password [email or web address]  And look for the night coverage person covering for me after hours  [facility] Office  [phone]

## 2013-10-19 MED ORDER — DOCUSATE SODIUM 100 MG PO CAPS
200.0000 mg | ORAL_CAPSULE | Freq: Two times a day (BID) | ORAL | Status: DC
  Administered 2013-10-19 – 2013-10-20 (×3): 200 mg via ORAL
  Filled 2013-10-19 (×4): qty 2

## 2013-10-19 MED ORDER — BOOST PLUS PO LIQD
237.0000 mL | Freq: Two times a day (BID) | ORAL | Status: DC
  Administered 2013-10-20: 237 mL via ORAL
  Filled 2013-10-19 (×2): qty 237

## 2013-10-19 MED ORDER — LEVOTHYROXINE SODIUM 112 MCG PO TABS
112.0000 ug | ORAL_TABLET | Freq: Every day | ORAL | Status: DC
  Administered 2013-10-20: 112 ug via ORAL
  Filled 2013-10-19 (×2): qty 1

## 2013-10-19 MED ORDER — POLYETHYLENE GLYCOL 3350 17 G PO PACK
17.0000 g | PACK | Freq: Every day | ORAL | Status: DC
  Administered 2013-10-19 – 2013-10-20 (×2): 17 g via ORAL
  Filled 2013-10-19 (×2): qty 1

## 2013-10-19 MED ORDER — POLYVINYL ALCOHOL 1.4 % OP SOLN
2.0000 [drp] | OPHTHALMIC | Status: DC
Start: 2013-10-19 — End: 2013-10-20
  Administered 2013-10-19 – 2013-10-20 (×5): 2 [drp] via OPHTHALMIC
  Filled 2013-10-19: qty 15

## 2013-10-19 NOTE — Progress Notes (Signed)
[facility]                                                                                    Patient Demographics  [name], is a 80 y.o. female, DOB - 05-27-1917, ZOX:[id number]  Admit date - 10/15/2013   Admitting Physician Cindy Sea, MD  Outpatient Primary MD for the patient is Cindy Hoit, MD  LOS - 4   Chief Complaint  Patient presents with  . Fever  . 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 Patient and family and now want gentle medical treatment to continue on which she seems to be gradually improving, if in the next few days she does any clinical decline family and patient are open for hospice.    Assessment & Plan     1. Septic shock. Due to Ascending cholangitis. She status post cholecystectomy in the past, this could represent a pancreatic mass compressing on the CBD. Due to patient's advanced age and baseline frail status, focus of care to comfort with gentle medical treatment if further declines full comfort care, palliative care consulted, discussed with patient's daughter [name]. Continue antibiotics advance to soft diet and monitor.    2. Acute renal failure due to #1 above, IV fluids to hydrate and monitor.      3. Elevated liver enzymes with elevated total bilirubin. Secondary to #1 above     4. Hypothyroidism. Place on IV Synthroid and monitor.      5. GERD. IV PPI.      6. Hypokalemia. Replaced and monitor.      7. Thrombocytopenia. Likely due to sepsis and marrow suppression, no active bleeding. Will monitor closely. We'll avoid Lovenox heparin for now.     8. Severe metabolic acidosis due to septic shock. Supportive care only as in  #1.     9.NSTEMI - discussed with patient's daughter [name], at this time aggressive care not desired, allergic to aspirin, chest pain-free, placed on Plavix, no beta blocker as blood pressure is low from sepsis, no statin as liver enzymes are elevated, goal of care will be comfort with gentle medical treatment on board. Continue Plavix along with supportive care.      Code Status: No intubation or CPR, condition remains extremely guarded. Palliative care consulted.  Family Communication: Discussed with daughter-in-law and Cindy  Disposition Plan:  SNF   Procedures CT scan abdomen pelvis, right upper quadrant ultrasound, possible ERCP   Consults  GI, PCCM, palliative care   Medications  Scheduled Meds: .  ceFAZolin (ANCEF) IV  1 g Intravenous Q12H  . clopidogrel  75 mg Oral Daily  . docusate sodium  200 mg Oral BID  . levothyroxine  50 mcg Intravenous Daily  . pantoprazole  40 mg Oral Daily  . polyethylene glycol  17 g Oral Daily  . polyvinyl alcohol  2 drop Both Eyes 6 times per day  . sodium chloride  3  mL Intravenous Q12H   Continuous Infusions:   PRN Meds:.ALPRAZolam, haloperidol lactate, morphine injection, ondansetron (ZOFRAN) IV  DVT Prophylaxis   SCDs    Lab Results  Component Value Date   PLT 38* 10/16/2013    Antibiotics    Anti-infectives   Start     Dose/Rate Route Frequency Ordered Stop   10/18/13 1400  ceFAZolin (ANCEF) IVPB 1 g/50 mL premix     1 g 100 mL/hr over 30 Minutes Intravenous Every 12 hours 10/18/13 1127     10/17/13 1200  vancomycin (VANCOCIN) IVPB 1000 mg/200 mL premix  Status:  Discontinued     1,000 mg 200 mL/hr over 60 Minutes Intravenous Every 48 hours 10/15/13 1243 10/17/13 0744   10/16/13 1200  ceFEPIme (MAXIPIME) 1 g in dextrose 5 % 50 mL IVPB  Status:  Discontinued     1 g 100 mL/hr over 30 Minutes Intravenous Every 24 hours 10/15/13 1243 10/15/13 1405   10/15/13 1500  meropenem (MERREM) 500 mg in sodium chloride 0.9 % 50  mL IVPB  Status:  Discontinued     500 mg 100 mL/hr over 30 Minutes Intravenous Every 12 hours 10/15/13 1405 10/18/13 1127   10/15/13 1200  ceFEPIme (MAXIPIME) 2 g in dextrose 5 % 50 mL IVPB     2 g 100 mL/hr over 30 Minutes Intravenous STAT 10/15/13 1146 10/15/13 1227   10/15/13 1145  vancomycin (VANCOCIN) IVPB 1000 mg/200 mL premix     1,000 mg 200 mL/hr over 60 Minutes Intravenous  Once 10/15/13 1137 10/15/13 1257          Subjective:   [name] today has, No headache, No chest pain, No abdominal pain - No Nausea, No new weakness tingling or numbness, No Cough - SOB.    Objective:   Filed Vitals:   10/18/13 0530 10/18/13 1435 10/18/13 2206 10/19/13 0637  BP: 128/72 132/66 126/74 144/73  Pulse: 75 71 74 72  Temp: 98.6 F (37 C) 98.3 F (36.8 C) 98.6 F (37 C) 97.6 F (36.4 C)  TempSrc: Oral Oral Oral Oral  Resp: 24 20 22 21   Height:      Weight:      SpO2: 96% 95% 98% 96%    Wt Readings from Last 3 Encounters:  10/17/13 79.5 kg (175 lb 4.3 oz)  10/17/13 79.5 kg (175 lb 4.3 oz)  10/17/13 79.5 kg (175 lb 4.3 oz)     Intake/Output Summary (Last 24 hours) at 10/19/13 1017 Last data filed at 10/19/13 1610  Gross per 24 hour  Intake    840 ml  Output   1050 ml  Net   -210 ml    Exam Awake Alert, Oriented X 3, No new F.N deficits, Normal affect, chronic blindness  in right eye [place].AT,PERRAL Supple Neck,No JVD, No cervical lymphadenopathy appriciated.  Symmetrical Chest wall movement, Good air movement bilaterally, CTAB RRR,No Gallops,Rubs or new Murmurs, No Parasternal Heave +ve B.Sounds, Abd Soft, Non tender, No organomegaly appriciated, No rebound - guarding or rigidity. No Cyanosis, Clubbing or edema, No new Rash or bruise     Data Review   Micro Results Recent Results (from the past 240 hour(s))  CULTURE, BLOOD (ROUTINE X 2)     Status: None   Collection Time    10/15/13 11:32 AM      Result Value Ref Range Status   Specimen Description BLOOD  RIGHT WRIST   Final   Special Requests BOTTLES DRAWN AEROBIC AND ANAEROBIC 5 CC  EA   Final   Culture  Setup Time     Final   Value: 10/15/2013 14:17     Performed at [facility]   Culture     Final   Value: ESCHERICHIA COLI     Note: SUSCEPTIBILITIES PERFORMED ON PREVIOUS CULTURE WITHIN THE LAST 5 DAYS.     Note: Gram Stain Report Called to,Read Back By and Verified With: MINDY HOPPER 10/16/13 AT 0135 RIDK     Performed at [facility]   Report Status 10/18/2013 FINAL   Final  CULTURE, BLOOD (ROUTINE X 2)     Status: None   Collection Time    10/15/13 11:45 AM      Result Value Ref Range Status   Specimen Description BLOOD LEFT ANTECUBITAL   Final   Special Requests BOTTLES DRAWN AEROBIC AND ANAEROBIC   Final   Culture  Setup Time     Final   Value: 10/15/2013 14:17     Performed at [facility]   Culture     Final   Value: ESCHERICHIA COLI     Note: Gram Stain Report Called to,Read Back By and Verified With: MINDY HOPPER 10/16/13 AT 0135 RIDK     Performed at [facility]   Report Status 10/18/2013 FINAL   Final   Organism ID, Bacteria ESCHERICHIA COLI   Final  URINE CULTURE     Status: None   Collection Time    10/15/13 11:51 AM      Result Value Ref Range Status   Specimen Description URINE, CATHETERIZED   Final   Special Requests NONE   Final   Culture  Setup Time     Final   Value: 10/15/2013 14:31     Performed at [facility] Count     Final   Value: NO GROWTH     Performed at [facility]   Culture     Final   Value: NO GROWTH     Performed at [facility]   Report Status 10/16/2013 FINAL   Final  MRSA PCR SCREENING     Status: None   Collection Time    10/15/13  2:21 PM      Result Value Ref Range Status   MRSA by PCR NEGATIVE  NEGATIVE Final   Comment:            The GeneXpert MRSA Assay (FDA     approved for NASAL specimens     only), is one component of a     comprehensive MRSA  colonization     surveillance program. It is not     intended to diagnose MRSA     infection nor to guide or     monitor treatment for     MRSA infections.    Radiology Reports Ct Abdomen Pelvis Wo Contrast  10/15/2013   CLINICAL DATA:  Fever.  EXAM: CT ABDOMEN AND PELVIS WITHOUT CONTRAST  TECHNIQUE: Multidetector CT imaging of the abdomen and pelvis was performed following the standard protocol without intravenous contrast.  COMPARISON:  DG PELVIS PORTABLE dated 07/02/2011; CT L SPINE W/O CM dated 03/02/2010  FINDINGS: Prior cholecystectomy. Severe dilatation of the intrahepatic and extrahepatic biliary ducts. The common bile duct measures 2.2 cm in diameter. Common bile duct is distended to the level of the ampulla of Vater/ pancreatic head. A pancreatic head mass or ampullary mass cannot be excluded. ERCP should be considered for further  evaluation. Punctate calcifications in this region may be related to ductal calcification and/or vascular calcifications. No definite calcified stone is noted in the distal common bile duct. Liver difficult to evaluate without contrast. Lucencies noted most likely distended biliary ducts. Spleen normal. Pancreas normal.  No focal adrenal abnormality identified. 3.1 cm simple right renal cyst. 1.5 cm left renal cyst. No hydronephrosis or obstructing ureteral stone. The bladder is nondistended. Calcified uterine fibroids. Adnexa unremarkable. No free pelvic fluid.  No adenopathy. 4.1 x 4.1 infrarenal suprailiac abdominal aortic aneurysm is present. Ectasia of both common iliac arteries to 15 mm noted.  Prior right colonic surgery. Appendix is not definitely identified. Stool is present in the colon. Mild distention of small and large bowel noted. These findings suggest adynamic ileus. Follow-up abdominal series suggested. No site of bowel obstruction noted. Stool is present in the colon. Stomach is minimally distended. Hiatal hernia is present. No free air.  Cardiomegaly.  Coronary artery disease. Cardiac pacer. Atelectasis in the lung bases. Small pleural effusions. Left hip prosthesis. Slight deformity right femoral neck noted. This may be from prior trauma. No acute fracture line is identified. If the patient has right hip symptoms, right hip series followup suggested .  Marland Kitchen  1. Severe intrahepatic and extrahepatic biliary ductal dilatation with the common bile duct measuring 2.2 cm. The common bile duct is distended to the level of the ampulla /pancreatic head. A pancreatic head mass/malignancy cannot be excluded. ERCP is suggested for further evaluation. 2. 4.1 x 4.1 infrarenal, suprailiac abdominal aortic aneurysm with bilateral common iliac artery ectasia. 3. Adynamic ileus. 4. Cardiomegaly.  Coronary artery disease. 5. Slight deformity right femoral neck, this may be related to degenerative change and/or old trauma. No definite fracture identified. If patient is symptomatic in this region follow-up right hip series is suggested .   Electronically Signed   By: Maisie Fus  Register   On: 10/15/2013 17:15   Dg Chest Port 1 View  10/15/2013   CLINICAL DATA:  Fever and weakness  EXAM: PORTABLE CHEST - 1 VIEW  COMPARISON:  DG CHEST 1 VIEW dated 07/01/2011  FINDINGS: Low lung volumes with crowding of the central pulmonary markings. There is no focal parenchymal opacity, pleural effusion, or pneumothorax. The heart and mediastinal contours are unremarkable. There is a dual lead cardiac pacer in satisfactory position.Stable cardiomediastinal silhouette. Mild cardiomegaly.  IMPRESSION: No active disease.   Electronically Signed   By: Elige Ko   On: 10/15/2013 12:50   US Abdomen Limited Ruq  10/15/2013   CLINICAL DATA:  Sepsis, elevated LFTs  EXAM: US ABDOMEN LIMITED - RIGHT UPPER QUADRANT  COMPARISON:  None.  FINDINGS: Gallbladder:  Status post cholecystectomy.  Common bile duct:  Diameter: 20 mm. This is within normal limits given the post cholecystectomy state. It does taper  distally into the region of the pancreatic head.  Liver:  Mild nodularity is noted which may be related to some underlying cirrhotic change.  IMPRESSION: Mild nodularity of the liver.  Status post cholecystectomy.  No acute abnormality is noted.   Electronically Signed   By: Alcide Clever M.D.   On: 10/15/2013 16:43    CBC  Recent Labs Lab 10/15/13 1132 10/16/13 0310  WBC 18.7* 17.1*  HGB 12.3 12.1  HCT 36.3 36.8  PLT 50* 38*  MCV 86.4 87.0  MCH 29.3 28.6  MCHC 33.9 32.9  RDW 13.8 14.4  LYMPHSABS 0.7  --   MONOABS 0.7  --   EOSABS 0.0  --  BASOSABS 0.0  --     Chemistries   Recent Labs Lab 10/15/13 1132 10/16/13 0310 10/16/13 1155 10/17/13 0315  NA 135* 136*  --   --   K 3.0* 4.9  --   --   CL 94* 107  --   --   CO2 14* 13*  --   --   GLUCOSE 79 79  --   --   BUN 36* 38*  --   --   CREATININE 1.63* 1.43*  --   --   CALCIUM 8.6 7.7*  --   --   MG  --  1.5  --  1.6  AST 78* 88* 83*  --   ALT 77* 68* 67*  --   ALKPHOS 144* 118* 120*  --   BILITOT 4.1* 3.7* 3.2*  --    ------------------------------------------------------------------------------------------------------------------ estimated creatinine clearance is 24.5 ml/min (by C-G formula based on Cr of 1.43). ------------------------------------------------------------------------------------------------------------------ No results found for this basename: HGBA1C,  in the last 72 hours ------------------------------------------------------------------------------------------------------------------ No results found for this basename: CHOL, HDL, LDLCALC, TRIG, CHOLHDL, LDLDIRECT,  in the last 72 hours ------------------------------------------------------------------------------------------------------------------ No results found for this basename: TSH, T4TOTAL, FREET3, T3FREE, THYROIDAB,  in the last 72  hours ------------------------------------------------------------------------------------------------------------------ No results found for this basename: VITAMINB12, FOLATE, FERRITIN, TIBC, IRON, RETICCTPCT,  in the last 72 hours  Coagulation profile  Recent Labs Lab 10/16/13 1329  INR 1.47    No results found for this basename: DDIMER,  in the last 72 hours  Cardiac Enzymes  Recent Labs Lab 10/17/13 0315  TROPONINI 2.22*   ------------------------------------------------------------------------------------------------------------------ No components found with this basename: POCBNP,       Total Critical Care time in examining the patient bedside, evaluating Lab work and other data, over half of the total time was spent in coordinating patient care on the floor or bedisde in talking to patient/family members, communicating with nursing Staff on the floor and sub specialists GI  to coordinate patients medical care and needs is  45 Minutes.   The condition which has caused critical injury/acute impairment of CVS vital organ system with a high probability of sudden clinically significant deterioration and can cause Potential Life threatening injury to this patient addressed today is  septic shock with hypotension, acute renal failure, metabolic acidosis requiring IV fluids for resuscitation, IV bicarbonate drip.  See all Orders from today for further details     Cindy SeaSINGH,PRASHANT K M.D on 10/19/2013 at 10:17 AM  Between 7am to 7pm - Pager - [phone]  After 7pm go to [email or web address] - password [email or web address]  And look for the night coverage person covering for me after hours  [facility] Office  [phone]

## 2013-10-19 NOTE — Progress Notes (Signed)
Clinical Social Work Department BRIEF PSYCHOSOCIAL ASSESSMENT 10/19/2013  Patient:  [name],[name]     Account Number:  [id number]     Admit date:  10/15/2013  Clinical Social Worker:  Doroteo GlassmanSIMPSON,AMANDA, LCSWA  Date/Time:  10/19/2013 10:06 AM  Referred by:  Physician  Date Referred:  10/19/2013 Referred for  SNF Placement   Other Referral:   Interview type:  Other - See comment Other interview type:   Pt's grandson, [name], via phone.    PSYCHOSOCIAL DATA Living Status:  ALONE Admitted from facility:   Level of care:   Primary support name:  [name] Primary support relationship to patient:  FAMILY Degree of support available:   strong    CURRENT CONCERNS Current Concerns  Post-Acute Placement   Other Concerns:    SOCIAL WORK ASSESSMENT / PLAN Spoke with Pt's grandson, [name], via phone re: Pt's d/c plan.    [name] stated that, in a perfect world, he would have his grandmother return to her home and he and her hired help would continue to care for her.  [name] stated that he knows this isn't realistic and that she will need care at a facility upon d/c.    [name] would like for Pt to go to a residential Hospice facility, as this was her wish.  [name] stated that he promised her that he wouldn't put her in a nursing home, however he understands that this may have to be the d/c plan, should residential Hospice not be an option.  [name] would be ok with SNF if he can say that he, at the very least, explored residential Hospice.    To that end, [name] gave CSW permission to [facility] a [facility]  He also gave CSW permission to begin a SNF search, as a back-up plan.  [name] stated that if Pt did need SNF, his first choice would be [facility].    CSW to leave Residential Hospice Facilities list in Pt's room, as well as [place] SNFs list.    CSW thanked [name] for his time.    [facility] and [facility].   Assessment/plan status:  Psychosocial Support/Ongoing Assessment of Needs Other assessment/ plan:   Information/referral to community resources:   Residential Hospice Facilities list  SNF list    PATIENT'S/FAMILY'S RESPONSE TO PLAN OF CARE: [name] was calm, cooperative and pleasant.    [name] wrestled with the notion of having to send Pt to SNF, as, per Cindy Huaavid, Pt specifically asked him not to send her to a nursing home.  CSW and [name] explored all other options and [name] expressed gratitude to CSW in helping him process the situation.  He stated that he feels supported and that, because of this support, he's coping well with his grandmother's current medical situation.    [name] thanked CSW for time and assistance.   Providence CrosbyAmanda Kellyn Mansfield, LCSWA Clinical Social Work [phone]

## 2013-10-19 NOTE — Progress Notes (Addendum)
Clinical Social Work Department CLINICAL SOCIAL WORK PLACEMENT NOTE 10/19/2013  Patient:  Cindy LorenzoMCCUISTON,[name]  Account Number:  [id number] Admit date:  10/15/2013  Clinical Social Worker:  Doroteo GlassmanAMANDA SIMPSON, LCSWA  Date/time:  10/19/2013 10:13 AM  Clinical Social Work is seeking post-discharge placement for this patient at the following level of care:   SKILLED NURSING   (*CSW will update this form in Epic as items are completed)   10/19/2013  Patient/family provided with [facility] Department of Clinical Social Work's list of facilities offering this level of care within the geographic area requested by the patient (or if unable, by the patient's family).  10/19/2013  Patient/family informed of their freedom to choose among providers that offer the needed level of care, that participate in Medicare, Medicaid or managed care program needed by the patient, have an available bed and are willing to accept the patient.  10/19/2013  Patient/family informed of [facility]' ownership interest in [facility], as well as of the fact that they are under no obligation to receive care at this facility.  PASARR submitted to EDS on 10/19/2013 PASARR number received from EDS on 10/19/2013  FL2 transmitted to all facilities in geographic area requested by pt/family on  10/19/2013 FL2 transmitted to all facilities within larger geographic area on   Patient informed that his/her managed care company has contracts with or will negotiate with  certain facilities, including the following:     Patient/family informed of bed offers received:  10/20/2013 [facility] Physician recommends and patient chooses bed at    [facility] 10[facility]2013 Patient to be transferred to facility by ambulance Sharin Mons([facility])  The following physician request were entered in Epic:   Additional Comments:  Providence CrosbyAmanda Simpson, LCSWA Clinical Social  Work [phone]  Loletta SpecterSuzanna Kidd, MSW, LCSW Clinical Social Work [phone]

## 2013-10-20 DIAGNOSIS — Z515 Encounter for palliative care: Secondary | ICD-10-CM

## 2013-10-20 DIAGNOSIS — D649 Anemia, unspecified: Secondary | ICD-10-CM

## 2013-10-20 DIAGNOSIS — I4430 Unspecified atrioventricular block: Secondary | ICD-10-CM

## 2013-10-20 DIAGNOSIS — N390 Urinary tract infection, site not specified: Secondary | ICD-10-CM

## 2013-10-20 MED ORDER — ALPRAZOLAM 0.5 MG PO TABS: 0.2500 mg | ORAL_TABLET | Freq: Two times a day (BID) | ORAL | Status: DC | PRN

## 2013-10-20 MED ORDER — METOPROLOL TARTRATE 25 MG/10 ML ORAL SUSPENSION: 12.5000 mg | Freq: Two times a day (BID) | ORAL | Status: DC

## 2013-10-20 MED ORDER — MORPHINE SULFATE (CONCENTRATE) 10 MG /0.5 ML PO SOLN: 5.0000 mg | ORAL | Status: DC | PRN

## 2013-10-20 MED ORDER — POLYETHYLENE GLYCOL 3350 17 G PO PACK: 17.0000 g | PACK | Freq: Every day | ORAL | Status: DC | PRN

## 2013-10-20 MED ORDER — CEFUROXIME AXETIL 500 MG PO TABS: 500.0000 mg | ORAL_TABLET | Freq: Two times a day (BID) | ORAL | Status: DC

## 2013-10-20 NOTE — Evaluation (Signed)
Physical Therapy Evaluation Patient Details Name: Mammie Lorenzodna R Vandyne MRN: [id number] DOB: 1916-12-19 Today's Date: 10/20/2013   History of Present Illness  Christain Sacramentodna Mcinturff is a 80 y.o. female, with history of complete heart block status post pacemaker placement, right sided blindness due to central retinal artery occlusion, gout, hypertension, and GERD, DVT in the past who lives at home with family living nearby and a caregiver at home brought in by family members with chief complaints of fever and generalized weakness, patient complains of some mild right-sided back/flank pain, denies any headache, no chest pain, no diarrhea or dysuria, no focal weakness. In the ER workup suggestive of septic shock with UTI,   Clinical Impression  Pt will benefit from PT to address deficits below;     Follow Up Recommendations Home health PT    Equipment Recommendations  None recommended by PT    Recommendations for Other Services       Precautions / Restrictions Precautions Precautions: Fall      Mobility  Bed Mobility Overal bed mobility: Needs Assistance Bed Mobility: Supine to Sit     Supine to sit: Min guard     General bed mobility comments: incr time, assist with trunk  Transfers Overall transfer level: Needs assistance Equipment used: Rolling walker (2 wheeled) Transfers: Sit to/from Stand Sit to Stand: Min assist         General transfer comment: assist for wt shift and intial balance  Ambulation/Gait Ambulation/Gait assistance: Min assist Ambulation Distance (Feet): 10 Feet Assistive device: Rolling walker (2 wheeled) Gait Pattern/deviations: Step-through pattern;Decreased stride length;Trunk flexed Gait velocity: decr   General Gait Details: cues for posture, step length, RW safety  Stairs            Wheelchair Mobility    Modified Rankin (Stroke Patients Only)       Balance Overall balance assessment: Needs assistance Sitting-balance support: Feet  supported;No upper extremity supported Sitting balance-Leahy Scale: Fair       Standing balance-Leahy Scale: Poor                       Pertinent Vitals/Pain sats 95, HR 89; pt with mild dizziness but no blood pressure cuff available to check BP    Home Living Family/patient expects to be discharged to:: Skilled nursing facility                      Prior Function                 Hand Dominance        Extremity/Trunk Assessment   Upper Extremity Assessment: Generalized weakness           Lower Extremity Assessment: Generalized weakness         Communication   Communication: HOH (did not have her hearing aides)  Cognition Arousal/Alertness: Awake/alert Behavior During Therapy: WFL for tasks assessed/performed Overall Cognitive Status: Within Functional Limits for tasks assessed                      General Comments      Exercises        Assessment/Plan    PT Assessment Patient needs continued PT services  PT Diagnosis Difficulty walking   PT Problem List Decreased strength;Decreased activity tolerance;Decreased balance;Decreased mobility;Decreased knowledge of use of DME  PT Treatment Interventions DME instruction;Gait training;Functional mobility training;Therapeutic activities;Therapeutic exercise;Patient/family education;Balance training   PT Goals (Current goals can be found  in the Care Plan section) Acute Rehab PT Goals Patient Stated Goal: get back home PT Goal Formulation: With patient Time For Goal Achievement: 10/27/13 Potential to Achieve Goals: Good    Frequency Min 3X/week   Barriers to discharge        End of Session Equipment Utilized During Treatment: Gait belt Activity Tolerance: Patient limited by fatigue Patient left: in bed;with call bell/phone within reach         Time: 1115-1135 PT Time Calculation (min): 20 min   Charges:   PT Evaluation $Initial PT Evaluation Tier I: 1  Procedure PT Treatments $Gait Training: 8-22 mins   PT G Codes:          Melizza Kanode 11/13/2013, 1:03 PM

## 2013-10-20 NOTE — Progress Notes (Signed)
Pt for discharge [facility].  CSW facilitated pt discharge needs including contacting facility, faxing pt discharge information via [facility], discussing with pt and pt niece at bedside, notifying pt daughter-in-law, [name] via telephone, providing RN phone number to call report, and arranging ambulance transport via [facility]. (Service Request ID#: [id number]).  No further social work needs identified at this time.  CSW signing off.   Loletta SpecterSuzanna Daffney Greenly, MSW, LCSW Clinical Social Work [phone]

## 2013-10-20 NOTE — Discharge Instructions (Signed)
Follow with Primary MD Pamelia HoitWILSON,FRED HENRY, MD in 7 days   Get CBC, CMP, checked 7 days by Primary MD and again as instructed by your Primary MD.     Activity: As tolerated with Full fall precautions use walker/cane & assistance as needed   Disposition SNF   Diet: Soft Dysphagia 3 Heart Healthy  Diet with feeding assistance and Aspiration precautions  For Heart failure patients - Check your Weight same time everyday, if you gain over 2 pounds, or you develop in leg swelling, experience more shortness of breath or chest pain, call your Primary MD immediately. Follow Cardiac Low Salt Diet and 1.8 lit/day fluid restriction.   On your next visit with her primary care physician please Get Medicines reviewed and adjusted.  Please request your Prim.MD to go over all Hospital Tests and Procedure/Radiological results at the follow up, please get all Hospital records sent to your Prim MD by signing hospital release before you go home.   If you experience worsening of your admission symptoms, develop shortness of breath, life threatening emergency, suicidal or homicidal thoughts you must seek medical attention immediately by calling 911 or calling your MD immediately  if symptoms less severe.  You Must read complete instructions/literature along with all the possible adverse reactions/side effects for all the Medicines you take and that have been prescribed to you. Take any new Medicines after you have completely understood and accpet all the possible adverse reactions/side effects.   Do not drive and provide baby sitting services if your were admitted for syncope or siezures until you have seen by Primary MD or a Neurologist and advised to do so again.  Do not drive when taking Pain medications.    Do not take more than prescribed Pain, Sleep and Anxiety Medications  Special Instructions: If you have smoked or chewed Tobacco  in the last 2 yrs please stop smoking, stop any regular Alcohol  and or  any Recreational drug use.  Wear Seat belts while driving.   Please note  You were cared for by a hospitalist during your hospital stay. If you have any questions about your discharge medications or the care you received while you were in the hospital after you are discharged, you can call the unit and asked to speak with the hospitalist on call if the hospitalist that took care of you is not available. Once you are discharged, your primary care physician will handle any further medical issues. Please note that NO REFILLS for any discharge medications will be authorized once you are discharged, as it is imperative that you return to your primary care physician (or establish a relationship with a primary care physician if you do not have one) for your aftercare needs so that they can reassess your need for medications and monitor your lab values.

## 2013-10-20 NOTE — Progress Notes (Signed)
CSW continuing to follow for disposition planning.   CSW received notification from PMT MD, Dr. Phillips OdorGolding that pt and pt family seeking [facility] as pt and pt family would like for pt to get her strength back to be able to return home with family.   [facility] and facility stated that they could accept pt and bed available today.   CSW contacted attending MD who stated that pt is medically stable for transition today.   CSW discussed with pt and pt niece at bedside and pt daughter-in-law via telephone. All in agreement with [facility].  CSW to facilitate pt discharge needs this afternoon.  Loletta SpecterSuzanna Kidd, MSW, LCSW Clinical Social Work [phone]

## 2013-10-20 NOTE — Discharge Summary (Signed)
[name], is a 80 y.o. female  DOB [birth year]  MRN [id number].  Admission date:  10/15/2013  Admitting Physician  Leroy Sea, MD  Discharge Date:  10/20/2013   Primary MD  [name], MD  Recommendations for primary care physician for things to follow:   Goal of Care his general medical treatment with focus being comfort, if declines further consider hospice   Admission Diagnosis  Anemia, unspecified [285.9] Central retinal artery occlusion [362.31] AV block [426.10] Small bowel obstruction [560.9] UTI (lower urinary tract infection) [599.0] Recurrent UTI [599.0] Pacemaker [V45.01] SSS (sick sinus syndrome) [427.81] DVT (deep venous thrombosis) [453.40]   Discharge Diagnosis  Anemia, unspecified [285.9] Central retinal artery occlusion [362.31] AV block [426.10] Small bowel obstruction [560.9] UTI (lower urinary tract infection) [599.0] Recurrent UTI [599.0] Pacemaker [V45.01] SSS (sick sinus syndrome) [427.81] DVT (deep venous thrombosis) [453.40]     Principal Problem:   Septic shock Active Problems:   Pacemaker   SSS (sick sinus syndrome)   DVT (deep venous thrombosis)   HYPOTHYROIDISM   HYPOKALEMIA   ANEMIA   HYPERTENSION   Central retinal artery occlusion   UTI (lower urinary tract infection)   Sepsis      Past Medical History  Diagnosis Date  . Complete heart block     s/p PPM by Dr Amil Amen 2004, recent Gen Change  07/13/10  . Small bowel obstruction   . Depression   . Hypertension   . Hypokalemia   . Recurrent UTI   . Gout   . GERD (gastroesophageal reflux disease)   . DVT (deep venous thrombosis)     Past Surgical History  Procedure Laterality Date  . Insert / replace / remove pacemaker      initial pacemaker by Dr Amil Amen 2004, replaced by [facility] for ERI 07/13/10  . Hip  arthroplasty  07/02/2011    Procedure: ARTHROPLASTY BIPOLAR HIP;  Surgeon: Harvie Junior;  Location: [facility];  Service: Orthopedics;  Laterality: Left;     Discharge Condition: Currently stable with poor long term prognosis   Follow UP  Follow-up Information   Follow up with [name], MD. Schedule an appointment as soon as possible for a visit in 1 week.   Specialty:  Family Medicine   Contact information:   [address] [place] [address] [phone]         Discharge Instructions  and  Discharge Medications      Discharge Orders   Future Appointments Provider Department Dept Phone   12/08/2013 3:00 PM Ronal Fear, NP [facility] [phone]   Future Orders Complete By Expires   Discharge instructions  As directed    Comments:     Follow with Primary MD [name], MD in 7 days   Get CBC, CMP, checked 7 days by Primary MD and again as instructed by your Primary MD.     Activity: As tolerated with Full fall precautions use walker/cane & assistance as needed   Disposition SNF  Diet: Soft Dysphagia 3 Heart Healthy  Diet with feeding assistance and Aspiration precautions  For Heart failure patients - Check your Weight same time everyday, if you gain over 2 pounds, or you develop in leg swelling, experience more shortness of breath or chest pain, call your Primary MD immediately. Follow Cardiac Low Salt Diet and 1.8 lit/day fluid restriction.   On your next visit with her primary care physician please Get Medicines reviewed and adjusted.  Please request your Prim.MD to go over all Hospital Tests and Procedure/Radiological results at the follow up, please get all Hospital records sent to your Prim MD by signing hospital release before you go home.   If you experience worsening of your admission symptoms, develop shortness of breath, life threatening emergency, suicidal or homicidal thoughts you must seek medical attention  immediately by calling 911 or calling your MD immediately  if symptoms less severe.  You Must read complete instructions/literature along with all the possible adverse reactions/side effects for all the Medicines you take and that have been prescribed to you. Take any new Medicines after you have completely understood and accpet all the possible adverse reactions/side effects.   Do not drive and provide baby sitting services if your were admitted for syncope or siezures until you have seen by Primary MD or a Neurologist and advised to do so again.  Do not drive when taking Pain medications.    Do not take more than prescribed Pain, Sleep and Anxiety Medications  Special Instructions: If you have smoked or chewed Tobacco  in the last 2 yrs please stop smoking, stop any regular Alcohol  and or any Recreational drug use.  Wear Seat belts while driving.   Please note  You were cared for by a hospitalist during your hospital stay. If you have any questions about your discharge medications or the care you received while you were in the hospital after you are discharged, you can call the unit and asked to speak with the hospitalist on call if the hospitalist that took care of you is not available. Once you are discharged, your primary care physician will handle any further medical issues. Please note that NO REFILLS for any discharge medications will be authorized once you are discharged, as it is imperative that you return to your primary care physician (or establish a relationship with a primary care physician if you do not have one) for your aftercare needs so that they can reassess your need for medications and monitor your lab values.   Increase activity slowly  As directed        Medication List         acetaminophen 325 MG tablet  Commonly known as:  TYLENOL  Take 650 mg by mouth every 4 (four) hours as needed. For pain.     allopurinol 100 MG tablet  Commonly known as:  ZYLOPRIM    Take 100 mg by mouth daily.     ALPRAZolam 0.5 MG tablet  Commonly known as:  XANAX  Take 0.5 tablets (0.25 mg total) by mouth 2 (two) times daily as needed for anxiety.     aspirin EC 81 MG tablet  Take 1 tablet (81 mg total) by mouth daily.     beta carotene w/minerals tablet  Take 1 tablet by mouth daily.     cefUROXime 500 MG tablet  Commonly known as:  CEFTIN  Take 1 tablet (500 mg total) by mouth 2 (two) times daily with a meal. 10 days  citalopram 10 MG tablet  Commonly known as:  CELEXA  Take 10 mg by mouth daily.     diltiazem 180 MG 24 hr capsule  Commonly known as:  DILACOR XR  Take 180 mg by mouth daily.     ENSURE  Take 237 mLs by mouth 2 (two) times daily.     folic acid 1 MG tablet  Commonly known as:  FOLVITE  Take 1 mg by mouth daily.     GELNIQUE TD  Place 1 packet onto the skin daily.     levothyroxine 112 MCG tablet  Commonly known as:  SYNTHROID, LEVOTHROID  Take 112 mcg by mouth daily before breakfast.     metoprolol tartrate 25 mg/10 mL Susp  Commonly known as:  LOPRESSOR  Take 5 mLs (12.5 mg total) by mouth 2 (two) times daily.     morphine CONCENTRATE 10 mg / 0.5 ml concentrated solution  Take 0.25 mLs (5 mg total) by mouth every 2 (two) hours as needed for moderate pain, severe pain, anxiety or shortness of breath.     pantoprazole 40 MG tablet  Commonly known as:  PROTONIX  Take 40 mg by mouth daily.     polyethylene glycol packet  Commonly known as:  MIRALAX / GLYCOLAX  Take 17 g by mouth daily as needed.          Diet and Activity recommendation: See Discharge Instructions above   Consults obtained - GI, PCCM, Pall care   Major procedures and Radiology Reports - PLEASE review detailed and final reports for all details, in brief -       Ct Abdomen Pelvis Wo Contrast  10/15/2013   CLINICAL DATA:  Fever.  EXAM: CT ABDOMEN AND PELVIS WITHOUT CONTRAST  TECHNIQUE: Multidetector CT imaging of the abdomen and pelvis was  performed following the standard protocol without intravenous contrast.  COMPARISON:  DG PELVIS PORTABLE dated 07/02/2011; CT L SPINE W/O CM dated 03/02/2010  FINDINGS: Prior cholecystectomy. Severe dilatation of the intrahepatic and extrahepatic biliary ducts. The common bile duct measures 2.2 cm in diameter. Common bile duct is distended to the level of the ampulla of Vater/ pancreatic head. A pancreatic head mass or ampullary mass cannot be excluded. ERCP should be considered for further evaluation. Punctate calcifications in this region may be related to ductal calcification and/or vascular calcifications. No definite calcified stone is noted in the distal common bile duct. Liver difficult to evaluate without contrast. Lucencies noted most likely distended biliary ducts. Spleen normal. Pancreas normal.  No focal adrenal abnormality identified. 3.1 cm simple right renal cyst. 1.5 cm left renal cyst. No hydronephrosis or obstructing ureteral stone. The bladder is nondistended. Calcified uterine fibroids. Adnexa unremarkable. No free pelvic fluid.  No adenopathy. 4.1 x 4.1 infrarenal suprailiac abdominal aortic aneurysm is present. Ectasia of both common iliac arteries to 15 mm noted.  Prior right colonic surgery. Appendix is not definitely identified. Stool is present in the colon. Mild distention of small and large bowel noted. These findings suggest adynamic ileus. Follow-up abdominal series suggested. No site of bowel obstruction noted. Stool is present in the colon. Stomach is minimally distended. Hiatal hernia is present. No free air.  Cardiomegaly. Coronary artery disease. Cardiac pacer. Atelectasis in the lung bases. Small pleural effusions. Left hip prosthesis. Slight deformity right femoral neck noted. This may be from prior trauma. No acute fracture line is identified. If the patient has right hip symptoms, right hip series followup suggested .  Marland Kitchen  1. Severe intrahepatic and extrahepatic biliary ductal  dilatation with the common bile duct measuring 2.2 cm. The common bile duct is distended to the level of the ampulla /pancreatic head. A pancreatic head mass/malignancy cannot be excluded. ERCP is suggested for further evaluation. 2. 4.1 x 4.1 infrarenal, suprailiac abdominal aortic aneurysm with bilateral common iliac artery ectasia. 3. Adynamic ileus. 4. Cardiomegaly.  Coronary artery disease. 5. Slight deformity right femoral neck, this may be related to degenerative change and/or old trauma. No definite fracture identified. If patient is symptomatic in this region follow-up right hip series is suggested .   Electronically Signed   By: Maisie Fus  Register   On: 10/15/2013 17:15   Dg Chest Port 1 View  10/15/2013   CLINICAL DATA:  Fever and weakness  EXAM: PORTABLE CHEST - 1 VIEW  COMPARISON:  DG CHEST 1 VIEW dated 07/01/2011  FINDINGS: Low lung volumes with crowding of the central pulmonary markings. There is no focal parenchymal opacity, pleural effusion, or pneumothorax. The heart and mediastinal contours are unremarkable. There is a dual lead cardiac pacer in satisfactory position.Stable cardiomediastinal silhouette. Mild cardiomegaly.  IMPRESSION: No active disease.   Electronically Signed   By: Elige Ko   On: 10/15/2013 12:50   US Abdomen Limited Ruq  10/15/2013   CLINICAL DATA:  Sepsis, elevated LFTs  EXAM: US ABDOMEN LIMITED - RIGHT UPPER QUADRANT  COMPARISON:  None.  FINDINGS: Gallbladder:  Status post cholecystectomy.  Common bile duct:  Diameter: 20 mm. This is within normal limits given the post cholecystectomy state. It does taper distally into the region of the pancreatic head.  Liver:  Mild nodularity is noted which may be related to some underlying cirrhotic change.  IMPRESSION: Mild nodularity of the liver.  Status post cholecystectomy.  No acute abnormality is noted.   Electronically Signed   By: Alcide Clever M.D.   On: 10/15/2013 16:43    Micro Results     Recent Results (from the  past 240 hour(s))  CULTURE, BLOOD (ROUTINE X 2)     Status: None   Collection Time    10/15/13 11:32 AM      Result Value Ref Range Status   Specimen Description BLOOD RIGHT WRIST   Final   Special Requests BOTTLES DRAWN AEROBIC AND ANAEROBIC 5 CC EA   Final   Culture  Setup Time     Final   Value: 10/15/2013 14:17     Performed at [facility]   Culture     Final   Value: ESCHERICHIA COLI     Note: SUSCEPTIBILITIES PERFORMED ON PREVIOUS CULTURE WITHIN THE LAST 5 DAYS.     Note: Gram Stain Report Called to,Read Back By and Verified With: MINDY HOPPER 10/16/13 AT 0135 RIDK     Performed at [facility]   Report Status 10/18/2013 FINAL   Final  CULTURE, BLOOD (ROUTINE X 2)     Status: None   Collection Time    10/15/13 11:45 AM      Result Value Ref Range Status   Specimen Description BLOOD LEFT ANTECUBITAL   Final   Special Requests BOTTLES DRAWN AEROBIC AND ANAEROBIC   Final   Culture  Setup Time     Final   Value: 10/15/2013 14:17     Performed at [facility]   Culture     Final   Value: ESCHERICHIA COLI     Note: Gram Stain Report Called to,Read Back By and Verified  With: MINDY HOPPER 10/16/13 AT 0135 RIDK     Performed at [facility]   Report Status 10/18/2013 FINAL   Final   Organism ID, Bacteria ESCHERICHIA COLI   Final  URINE CULTURE     Status: None   Collection Time    10/15/13 11:51 AM      Result Value Ref Range Status   Specimen Description URINE, CATHETERIZED   Final   Special Requests NONE   Final   Culture  Setup Time     Final   Value: 10/15/2013 14:31     Performed at [facility] Count     Final   Value: NO GROWTH     Performed at [facility]   Culture     Final   Value: NO GROWTH     Performed at [facility]   Report Status 10/16/2013 FINAL   Final  MRSA PCR SCREENING     Status: None   Collection Time    10/15/13  2:21 PM      Result Value Ref Range Status   MRSA by PCR  NEGATIVE  NEGATIVE Final   Comment:            The GeneXpert MRSA Assay (FDA     approved for NASAL specimens     only), is one component of a     comprehensive MRSA colonization     surveillance program. It is not     intended to diagnose MRSA     infection nor to guide or     monitor treatment for     MRSA infections.     History of present illness and  Hospital Course:     Kindly see H&P for history of present illness and admission details, please review complete Labs, Consult reports and Test reports for all details in brief [name], is a 80 y.o. female, patient with history of  complete heart block status post pacemaker placement, right sided blindness due to central retinal artery occlusion, gout, hypertension, and GERD, DVT in the past who lives at home with family living nearby and a caregiver at home brought in by family members with chief complaints of fever and generalized weakness, patient complains of some mild right-sided back/flank pain, denies any headache, no chest pain, no diarrhea or dysuria, no focal weakness.    Her workup was suggestive of septic shock due to ascending cholangitis. She also suffered a non-ST elevation MI. After detailed discussion with family members and patient it was decided that she will be treated with medications with goal of care being comfort, if any further decline hospice would be involved. No invasive procedures, surgeries or other heroics desired.      1. Septic shock. Due to Ascending cholangitis. She status post cholecystectomy in the past, this could represent a pancreatic mass compressing on the CBD. Due to patient's advanced age and baseline frail status, focus of care to comfort with gentle medical treatment if further declines full comfort care, palliative care consulted, discussed with patient's daughter Bonita Quin, patient and grandson. Continue oral antibiotics for another 10 days, soft diet, if declines again after antibiotics  are stopped consider hospice. Goal of care is comfort. No heroics, surgical procedures or invasive tests desired.      2. Acute renal failure due to #1 above, almost completely resolved after IV fluids and hydration    3. Elevated liver enzymes with elevated total bilirubin. Secondary to #  1 above , gradually improving   4. Hypothyroidism. A home dose Synthroid   5. GERD. Continue PPI.    6. Hypokalemia. Replaced and stable.     7. Thrombocytopenia.  due to sepsis and marrow suppression, no active bleeding. Repeat CBC in a week decided.    8. Severe metabolic acidosis due to septic shock. Resolved with supportive care.    9.NSTEMI - secondary to demands from sepsis, no chest pain, discussed with patient's daughter [name] and grandson, only medical treatment, not a candidate for invasive testing or procedures, will be placed on aspirin and beta blocker, due to advanced age we'll avoid statin. She is chest pain-free.         Today   Subjective:   Christain Sacramentodna Lope today has no headache,no chest abdominal pain,no new weakness tingling or numbness, feels much better   Objective:   Blood pressure 141/74, pulse 71, temperature 98 F (36.7 C), temperature source Oral, resp. rate 24, height 5\' 5"  (1.651 m), weight 79.5 kg (175 lb 4.3 oz), SpO2 98.00%.   Intake/Output Summary (Last 24 hours) at 10/20/13 1118 Last data filed at 10/20/13 0836  Gross per 24 hour  Intake    483 ml  Output   1250 ml  Net   -767 ml    Exam Awake Alert, Oriented *3, No new F.N deficits, Normal affect [place].AT,PERRAL Supple Neck,No JVD, No cervical lymphadenopathy appriciated.  Symmetrical Chest wall movement, Good air movement bilaterally, CTAB RRR,No Gallops,Rubs or new Murmurs, No Parasternal Heave +ve B.Sounds, Abd Soft, Non tender, No organomegaly appriciated, No rebound -guarding or rigidity. No Cyanosis, Clubbing or edema, No new Rash or bruise  Data Review   CBC w Diff: Lab Results    Component Value Date   WBC 17.1* 10/16/2013   HGB 12.1 10/16/2013   HCT 36.8 10/16/2013   PLT 38* 10/16/2013   LYMPHOPCT 4* 10/15/2013   MONOPCT 4 10/15/2013   EOSPCT 0 10/15/2013   BASOPCT 0 10/15/2013    CMP: Lab Results  Component Value Date   NA 136* 10/16/2013   K 4.9 10/16/2013   CL 107 10/16/2013   CO2 13* 10/16/2013   BUN 38* 10/16/2013   CREATININE 1.43* 10/16/2013   PROT 5.2* 10/16/2013   ALBUMIN 2.3* 10/16/2013   BILITOT 3.2* 10/16/2013   ALKPHOS 120* 10/16/2013   AST 83* 10/16/2013   ALT 67* 10/16/2013  .   Total Time in preparing paper work, data evaluation and todays exam - 35 minutes  Leroy SeaSINGH,PRASHANT K M.D on 10/20/2013 at 11:18 AM  [facility] Office  [phone]

## 2013-10-20 NOTE — Progress Notes (Signed)
Patient discharged to SNF, copies of all discharge medications and instructions to be sent to facility with transport.  Patient to be transported via [facility].

## 2013-10-20 NOTE — Consult Note (Signed)
Palliative Medicine Team at [facility]  Date: 10/20/2013   Patient Name: [name]  DOB: 08/13/1916  MRN: [id number]  Age / Sex: 80 y.o., female   PCP: Barbie BannerFred H Wilson, MD Referring Physician: Leroy SeaPrashant K Singh, MD  HPI/Reason for Consultation: 80 yo with a newly daignosed pancreatic mass, likely malignancy admitted with sepsis related to CBD obstruction and cholangitis. She was living at home with a ATC caregiver and her daughter and grandson are next door. At this time she is deconditioned and non-ambulatory but mentally doing well and making her own decisions.   Participants in Discussion: Patient herself and spoke with her Daughter by phone.  Goals/Summary of Discussion:  1. Code Status:  DNR  2. Scope of Treatment:  Maintain current medications  Focus on Comfort an QOL  No invasive work-ups  No re-hospitalization  Patient wants to "die at home"   3. Assessment/Plan:  Primary Diagnoses  1. Pancreatic Mass 2. NSTEMI 3. Functional status decline  Prognosis: <6 months-likley much less  PPS 40   Active Symptoms 1. Abdominal pain 2. Immobility  4. Palliative Prophylaxis:   Bowel Regimen   Terminal Secretions  Breakthrough Pain and Dyspnea   Agitation and Delirium  Nausea  5. Psychosocial Spiritual Asssessment/Interventions:  Patient and Family Adjustment to Illness/Prognosis: coping well  Spiritual Concerns or Needs: none expressed  6. Disposition:  Patient[facility], she very much wants to be there with him- she wants to try to get her strength back to be able to go home-family educated on hospice and how to obtain services when she declines. Request Palliative see her at SNF if possible.   ROS:  Urine retention, abdominal pain, inability to walk, dizziness  Social History:   reports that she quit smoking about 33 years ago. She has never used smokeless tobacco. She reports that she does not drink alcohol or use illicit  drugs. Living Situation: independent with caregiver Occupation: none  Family History: Family History  Problem Relation Age of Onset  . Stroke Father     Active Medications:  Outpatient medications: Prescriptions prior to admission  Medication Sig Dispense Refill  . acetaminophen (TYLENOL) 325 MG tablet Take 650 mg by mouth every 4 (four) hours as needed. For pain.       Marland Kitchen. allopurinol (ZYLOPRIM) 100 MG tablet Take 100 mg by mouth daily.        Marland Kitchen. ALPRAZolam (XANAX) 0.5 MG tablet Take 0.25 mg by mouth at bedtime.        Marland Kitchen. aspirin EC 81 MG tablet Take 1 tablet (81 mg total) by mouth daily.  90 tablet  3  . beta carotene w/minerals (OCUVITE) tablet Take 1 tablet by mouth daily.      . citalopram (CELEXA) 10 MG tablet Take 10 mg by mouth daily.        Marland Kitchen. diltiazem (DILACOR XR) 180 MG 24 hr capsule Take 180 mg by mouth daily.        Marland Kitchen. ENSURE (ENSURE) Take 237 mLs by mouth 2 (two) times daily.        . folic acid (FOLVITE) 1 MG tablet Take 1 mg by mouth daily.        Marland Kitchen. levothyroxine (SYNTHROID, LEVOTHROID) 112 MCG tablet Take 112 mcg by mouth daily before breakfast.      . Oxybutynin Chloride (GELNIQUE TD) Place 1 packet onto the skin daily.       . pantoprazole (PROTONIX) 40 MG tablet Take 40 mg  by mouth daily.          Current medications: Infusions:    Scheduled Medications: .  ceFAZolin (ANCEF) IV  1 g Intravenous Q12H  . clopidogrel  75 mg Oral Daily  . docusate sodium  200 mg Oral BID  . lactose free nutrition  237 mL Oral BID BM  . levothyroxine  112 mcg Oral QAC breakfast  . pantoprazole  40 mg Oral Daily  . polyethylene glycol  17 g Oral Daily  . polyvinyl alcohol  2 drop Both Eyes 6 times per day  . sodium chloride  3 mL Intravenous Q12H    PRN Medications: ALPRAZolam, haloperidol lactate, morphine injection, ondansetron (ZOFRAN) IV   Vital Signs: BP 141/74  Pulse 71  Temp(Src) 98 F (36.7 C) (Oral)  Resp 24  Ht 5\' 5"  (1.651 m)  Wt 79.5 kg (175 lb 4.3 oz)  BMI  29.17 kg/m2  SpO2 98%   Physical Exam:  General appearance: NAD, conversant, frail Eyes: anicteric sclerae, moist conjunctivae; no lid-lag; PERRLA HENT: Atraumatic; oropharynx clear with moist mucous membranes and no mucosal ulcerations; normal hard and soft palate Neck: Trachea midline; FROM, supple, no thyromegaly or lymphadenopathy Lungs: CTA, with normal respiratory effort and no intercostal retractions CV: RRR, no MRGs  Abdomen: Tense and distended, pain on RUQ palpation Extremities: +edema Skin: Normal temperature, turgor and texture; no rash, ulcers or subcutaneous nodules Psych: Appropriate affect, alert and oriented to person, place and time  Labs:  Basic or Comprehensive Metabolic Panel:    Component Value Date/Time   NA 136* 10/16/2013 0310   K 4.9 10/16/2013 0310   CL 107 10/16/2013 0310   CO2 13* 10/16/2013 0310   BUN 38* 10/16/2013 0310   CREATININE 1.43* 10/16/2013 0310   GLUCOSE 79 10/16/2013 0310   CALCIUM 7.7* 10/16/2013 0310   AST 83* 10/16/2013 1155   ALT 67* 10/16/2013 1155   ALKPHOS 120* 10/16/2013 1155   BILITOT 3.2* 10/16/2013 1155   PROT 5.2* 10/16/2013 1155   ALBUMIN 2.3* 10/16/2013 1155     CBC:    Component Value Date/Time   WBC 17.1* 10/16/2013 0310   HGB 12.1 10/16/2013 0310   HCT 36.8 10/16/2013 0310   PLT 38* 10/16/2013 0310   MCV 87.0 10/16/2013 0310   NEUTROABS 17.3* 10/15/2013 1132   LYMPHSABS 0.7 10/15/2013 1132   MONOABS 0.7 10/15/2013 1132   EOSABS 0.0 10/15/2013 1132   BASOSABS 0.0 10/15/2013 1132     BNP (last 3 results)  Recent Labs  10/17/13 0316  PROBNP 16109.6*   Educational Materials Given:  DNR: Yes  MOST: Yes  Healthcare Power-of-Attorney: Yes    Time: 50 minutes 9AM-950AM Greater than 50%  of this time was spent counseling and coordinating care related to the above assessment and plan.  Signed by: Edsel Petrin, DO  10/20/2013, 9:43 AM  Please contact Palliative Medicine Team phone at [phone] for questions and  concerns.

## 2013-12-02 ENCOUNTER — Ambulatory Visit: Payer: Medicare Other | Admitting: Nurse Practitioner

## 2013-12-08 ENCOUNTER — Encounter: Payer: Self-pay | Admitting: Nurse Practitioner

## 2013-12-08 ENCOUNTER — Ambulatory Visit (INDEPENDENT_AMBULATORY_CARE_PROVIDER_SITE_OTHER): Payer: Medicare Other | Admitting: Nurse Practitioner

## 2013-12-08 VITALS — BP 123/71 | HR 68 | Temp 97.7°F | Ht 63.0 in | Wt 150.0 lb

## 2013-12-08 DIAGNOSIS — H3410 Central retinal artery occlusion, unspecified eye: Secondary | ICD-10-CM

## 2013-12-08 DIAGNOSIS — K869 Disease of pancreas, unspecified: Secondary | ICD-10-CM

## 2013-12-08 DIAGNOSIS — Z789 Other specified health status: Secondary | ICD-10-CM

## 2013-12-08 DIAGNOSIS — K8689 Other specified diseases of pancreas: Secondary | ICD-10-CM

## 2013-12-08 NOTE — Progress Notes (Signed)
PATIENT: [name] DOB: Feb 25, 1917  REASON FOR VISIT: routine stroke follow up HISTORY FROM: patient  HISTORY OF PRESENT ILLNESS: 89 year pleasant Caucasian lady who is accompanied today by her caregiver [name] provides most of the history. Patient had sudden onset of vision loss in the right eye several months ago. She is unable to provide me history as to when it happened and I do not have any records to review for this. She has seen [place] and eye care and was subsequently referred to Dr. Luciana Axe and was diagnosed with retinal artery occlusion. She lost complete vision in the right eye but over the last few months has noticed some partial improvement in the temporal aspect of the right eye when she sees blurred images but is yet not able to read. She also has limited vision in the left eye which is long-standing. She started on aspirin which is tolerating well without bleeding, bruising or other side effects. She had lipid profile check her primary physician a few months ago which was okay. She had carotid ulltrasound done on 06/27/13 which shows mild plaques without significant stenosis. Transthoracic echo done on 06/27/13 also shows normal ejection fraction. She has no prior history of strokes or TIAs or significant neurological problems. She denies history of headaches, scalp tenderness, mildly [place]. She has no significant vascular risk factors except for age. She quit smoking 30 years ago. She is still living independently at home she has a caregiver. Her family checks on her frequently and lives next door.  UPDATE 12/08/13 (LL): [name] returns for stroke follow up.  Since last visit she was newly daignosed with a pancreatic mass, likely malignancy admitted with sepsis related to CBD obstruction and cholangitis.  She suffered a mild MI as well, in the setting of sepsis.  She was discharged to rehab and then back home and she has been managing better than expected.  Prognosis is poor,  was told <6 months-likley much less, focus now on QOL.  ROS:  14 system review of systems is positive for hearing loss, loss of vision, urinary incontinence and all the systems negative  ALLERGIES: Allergies  Allergen Reactions  . Aspirin     Prior history of stomach problems (? Ulcer), instructed to resume for stroke  . Ativan [Lorazepam] Other (See Comments)    Has opposite effect "makes her crazy"  . Penicillins Rash    HOME MEDICATIONS: Outpatient Prescriptions Prior to Visit  Medication Sig Dispense Refill  . acetaminophen (TYLENOL) 325 MG tablet Take 650 mg by mouth every 4 (four) hours as needed. For pain.       Marland Kitchen allopurinol (ZYLOPRIM) 100 MG tablet Take 100 mg by mouth daily.        Marland Kitchen ALPRAZolam (XANAX) 0.5 MG tablet Take 0.5 tablets (0.25 mg total) by mouth 2 (two) times daily as needed for anxiety.  30 tablet  0  . aspirin EC 81 MG tablet Take 1 tablet (81 mg total) by mouth daily.  90 tablet  3  . beta carotene w/minerals (OCUVITE) tablet Take 1 tablet by mouth daily.      . cefUROXime (CEFTIN) 500 MG tablet Take 1 tablet (500 mg total) by mouth 2 (two) times daily with a meal. 10 days      . citalopram (CELEXA) 10 MG tablet Take 10 mg by mouth daily.        Marland Kitchen diltiazem (DILACOR XR) 180 MG 24 hr capsule Take 180 mg by mouth daily.        Marland Kitchen  ENSURE (ENSURE) Take 237 mLs by mouth 2 (two) times daily.        . folic acid (FOLVITE) 1 MG tablet Take 1 mg by mouth daily.        Marland Kitchen. levothyroxine (SYNTHROID, LEVOTHROID) 112 MCG tablet Take 112 mcg by mouth daily before breakfast.      . metoprolol tartrate (LOPRESSOR) 25 mg/10 mL SUSP Take 5 mLs (12.5 mg total) by mouth 2 (two) times daily.      . Morphine Sulfate (MORPHINE CONCENTRATE) 10 mg / 0.5 ml concentrated solution Take 0.25 mLs (5 mg total) by mouth every 2 (two) hours as needed for moderate pain, severe pain, anxiety or shortness of breath.  30 mL  0  . Oxybutynin Chloride (GELNIQUE TD) Place 1 packet onto the skin daily.        . pantoprazole (PROTONIX) 40 MG tablet Take 40 mg by mouth daily.        . polyethylene glycol (MIRALAX / GLYCOLAX) packet Take 17 g by mouth daily as needed.  14 each  0    PHYSICAL EXAM  Filed Vitals:   12/08/13 1454  Height: 5\' 3"  (1.6 m)  Weight: 150 lb (68.04 kg)   Body mass index is 26.58 kg/(m^2). No exam data present   Physical Exam  General: well developed, well nourished, seated, in no evident distress  Head: head normocephalic and atraumatic. Orohparynx benign  Neck: supple with no carotid or supraclavicular bruits  Cardiovascular: regular rate and rhythm, no murmurs  Musculoskeletal: no deformity  Skin: no rash/petichiae. Pacemaker infraclavicular  Vascular: Normal pulses all extremities   Neurologic Exam  Mental Status: Awake and fully alert. Oriented to place and time. Recent and remote memory diminished. Attention span, concentration and fund of knowledge appropriate. Mood and affect appropriate.  Cranial Nerves:  Pupils equal, briskly reactive to light. Extraocular movements full without nystagmus. Near blindness and right eye with only slight blurred vision temporal aspect of right side. Limited vision in left eye with finger counting to 10 feet.  Visual fields unable to test. Hearing severely diminished bilaterally. Facial sensation intact. Face, tongue, palate moves normally and symmetrically.  Motor: Normal bulk and tone. Normal strength in all tested extremity muscles. Mild intermittent resting tremor left hand. No cogwheeling, bradykinesia or festination  Sensory: intact to light touch in all 4 extremities. Coordination: Rapid alternating movements normal in all extremities. Finger-to-nose and heel-to-shin performed accurately bilaterally.  Gait and Station: Arises from chair with some difficulty. Stance is stooped. Mild gait initiation apraxia with short steps. Gait demonstrates mild imbalance. Not able to heel, toe and tandem walk without difficulty. Uses a  wheeled walker. Reflexes: 1+ and symmetric.   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   Newly daignosed with a pancreatic mass, likely malignancy admitted with sepsis related to CBD obstruction and cholangitis in March.  She suffered a mild MI as well, in the setting of sepsis. Prognosis is poor, focus now on quality of life.  PLAN: I had a long discussion with the patient and caregiver regarding central retinal artery occlusion and her stroke risk factors and answered questions. Continue aspirin 81 mg daily for stroke prevention and maintain strict control of hypertension with blood pressure goal below 140/90.  Return for followup in 6 months or call earlier if necessary.  Tawny AsalLYNN E. Ramel Tobon, MSN, NP-C 12/08/2013,  2:59 PM [facility] [address], [address] [place], [place] [address] [phone]  Note: This document was prepared with digital dictation and possible smart phrase technology. Any transcriptional errors that result from this process are unintentional.

## 2013-12-08 NOTE — Patient Instructions (Signed)
Continue aspirin 81 mg daily for stroke prevention and maintain strict control of hypertension with blood pressure goal below 140/90. Check yearly carotid Dopplers.  Return for followup in 6 months or call earlier if necessary.

## 2013-12-18 ENCOUNTER — Encounter: Payer: Self-pay | Admitting: Cardiology

## 2013-12-18 ENCOUNTER — Ambulatory Visit (INDEPENDENT_AMBULATORY_CARE_PROVIDER_SITE_OTHER): Payer: Medicare Other | Admitting: Cardiology

## 2013-12-18 VITALS — BP 110/70 | HR 82 | Wt 152.0 lb

## 2013-12-18 DIAGNOSIS — I442 Atrioventricular block, complete: Secondary | ICD-10-CM

## 2013-12-18 DIAGNOSIS — Z950 Presence of cardiac pacemaker: Secondary | ICD-10-CM

## 2013-12-18 LAB — MDC_IDC_ENUM_SESS_TYPE_INCLINIC
Battery Impedance: 238 Ohm
Battery Remaining Longevity: 101 mo
Battery Voltage: 2.77 V
Date Time Interrogation Session: 20150528150743
Lead Channel Impedance Value: 420 Ohm
Lead Channel Pacing Threshold Amplitude: 0.75 V
Lead Channel Pacing Threshold Amplitude: 0.75 V
Lead Channel Pacing Threshold Pulse Width: 0.4 ms
Lead Channel Setting Pacing Amplitude: 2 V
Lead Channel Setting Pacing Amplitude: 2.5 V
Lead Channel Setting Pacing Pulse Width: 0.4 ms
MDC IDC MSMT LEADCHNL RA PACING THRESHOLD PULSEWIDTH: 0.4 ms
MDC IDC MSMT LEADCHNL RA SENSING INTR AMPL: 5.6 mV
MDC IDC MSMT LEADCHNL RV IMPEDANCE VALUE: 627 Ohm
MDC IDC SET LEADCHNL RV SENSING SENSITIVITY: 2.8 mV
MDC IDC STAT BRADY AP VP PERCENT: 89 %
MDC IDC STAT BRADY AP VS PERCENT: 1 %
MDC IDC STAT BRADY AS VP PERCENT: 10 %
MDC IDC STAT BRADY AS VS PERCENT: 0 %

## 2013-12-18 NOTE — Patient Instructions (Addendum)
Remote monitoring is used to monitor your Pacemaker of ICD from home. This monitoring reduces the number of office visits required to check your device to one time per year. It allows [place] to keep an eye on the functioning of your device to ensure it is working properly. You are scheduled for a device check from home on 03/20/14 . You may send your transmission at any time that day. If you have a wireless device, the transmission will be sent automatically. After your physician reviews your transmission, you will receive a postcard with your next transmission date.    Your physician wants you to follow-up in: 1 year with Dr. Jacquiline Doe will receive a reminder letter in the mail two months in advance. If you don't receive a letter, please call our office to schedule the follow-up appointment.  Your physician recommends that you continue on your current medications as directed. Please refer to the Current Medication list given to you today.

## 2013-12-18 NOTE — Progress Notes (Signed)
ELECTROPHYSIOLOGY OFFICE NOTE   Patient ID: [name] MRN: [id number], DOB/AGE: Sep 02, 1916   Date of Visit: 12/18/2013  Primary Physician: Benedetto GoadFred Wilson, MD Primary Cardiologist: Elease HashimotoNahser, MD Primary EP: Johney FrameAllred, MD Primary Neurologist: Pearlean BrownieSethi, MD Reason for Visit: EP/device follow-up  History of Present Illness  [name] is a 80 y.o. 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 She is accompanied by her caregiver who assists with history. Since last being seen in our clinic, she was diagnosed with a pancreatic mass, likely malignant, in April 2015 and was admitted with sepsis related to CBD obstruction and cholangitis. She suffered NSTEMI (peak troponin 2.2); however, this was in the setting of severe sepsis. She elected conservative management and does not desire any invasive procedures. She was continued on aspirin and started on metoprolol. She was not seen by Cardiology during that admission. She was discharged to Rehab for 21 days and is now back home. She has been managing better than expected but her prognosis is poor. She was told <6 months so the focus is on quality of life. Palliative Care saw in consult to determine goals of care.   Today, she states "I feel better than I have in a long while." She denies any complaints and has no pain of any kind. From cardiac standpoint, she has no complaints. She denies chest pain or shortness of breath. She denies palpitations, dizziness, near syncope or syncope. She denies LE swelling, orthopnea or PND.   Past Medical History Past Medical History  Diagnosis Date  . Complete heart block     s/p PPM by Dr Amil AmenEdmunds 2004, recent Gen Change  07/13/10  . Small bowel obstruction   . Depression   . Hypertension   . Hypokalemia   . Recurrent UTI   . Gout   . GERD (gastroesophageal reflux disease)    . DVT (deep venous thrombosis)     Past Surgical History Past Surgical History  Procedure Laterality Date  . Insert / replace / remove pacemaker      initial pacemaker by Dr Amil AmenEdmunds 2004, [facility]/13/10  . Hip arthroplasty  07/02/2011    Procedure: ARTHROPLASTY BIPOLAR HIP;  Surgeon: Harvie JuniorJohn L Graves;  Location: [facility] ORS;  Service: Orthopedics;  Laterality: Left;    Allergies/Intolerances Allergies  Allergen Reactions  . Aspirin     Prior history of stomach problems (? Ulcer), instructed to resume for stroke  . Ativan [Lorazepam] Other (See Comments)    Has opposite effect "makes her crazy"  . Penicillins Rash    Current Home Medications Current Outpatient Prescriptions  Medication Sig Dispense Refill  . acetaminophen (TYLENOL) 325 MG tablet Take 650 mg by mouth every 4 (four) hours as needed. For pain.       Marland Kitchen. allopurinol (ZYLOPRIM) 100 MG tablet Take 100 mg by mouth daily.        Marland Kitchen. ALPRAZolam (XANAX) 0.5 MG tablet Take 0.5 tablets (0.25 mg total) by mouth 2 (two) times daily as needed for anxiety.  30 tablet  0  . aspirin EC 81 MG tablet Take 1 tablet (81 mg total) by mouth daily.  90 tablet  3  . beta carotene w/minerals (OCUVITE) tablet Take 1 tablet by mouth daily.      . citalopram (CELEXA) 10 MG tablet Take 10 mg by mouth daily.        .Marland Kitchen  diltiazem (DILACOR XR) 180 MG 24 hr capsule Take 180 mg by mouth daily.        Marland Kitchen ENSURE (ENSURE) Take 237 mLs by mouth 2 (two) times daily.        . folic acid (FOLVITE) 1 MG tablet Take 1 mg by mouth daily.        Marland Kitchen levothyroxine (SYNTHROID, LEVOTHROID) 112 MCG tablet Take 112 mcg by mouth daily before breakfast.      . metoprolol tartrate (LOPRESSOR) 25 MG tablet Take 12.5 mg by mouth 2 (two) times daily.      . Oxybutynin Chloride (GELNIQUE TD) Place 1 packet onto the skin daily.       . pantoprazole (PROTONIX) 40 MG tablet Take 40 mg by mouth daily.        . polyethylene glycol (MIRALAX / GLYCOLAX) packet Take 17 g by mouth  daily as needed.  14 each  0  . [DISCONTINUED] POLYSACCHARIDE IRON (IRON POLYSACCHARIDES) 150 MG CAPS Take 150 mg by mouth 2 (two) times daily.       . [DISCONTINUED] warfarin (COUMADIN) 2 MG tablet Take 1.5 tablets (3 mg total) by mouth daily at 6 PM.  45 tablet  0   No current facility-administered medications for this visit.    Social History History   Social History  . Marital Status: Widowed    Spouse Name: N/A    Number of Children: 1  . Years of Education: 11th    Occupational History  .     Social History Main Topics  . Smoking status: Former Smoker -- 3.00 packs/day for 30 years    Types: Cigarettes    Quit date: 07/24/1980  . Smokeless tobacco: Never Used  . Alcohol Use: No  . Drug Use: No  . Sexual Activity: No   Other Topics Concern  . Not on file   Social History Narrative   Patient lives at home with her grandson and caregiver.   Caffeine use: 1 cup daily     Review of Systems General: No chills, fever, night sweats or weight changes Cardiovascular: No chest pain, dyspnea on exertion, edema, orthopnea, palpitations, paroxysmal nocturnal dyspnea Dermatological: No rash, lesions or masses Respiratory: No cough, dyspnea Urologic: No hematuria, dysuria Abdominal: No nausea, vomiting, diarrhea, bright red blood per rectum, melena, or hematemesis Neurologic: No visual changes, weakness, changes in mental status All other systems reviewed and are otherwise negative except as noted above.  Physical Exam Vitals: Blood pressure 110/70, pulse 82, weight 152 lb (68.947 kg).  General: Well developed, well appearing 80 y.o. female in no acute distress. HEENT: Normocephalic, atraumatic. EOMs intact. Sclera nonicteric. Oropharynx clear.  Neck: Supple. No JVD. Lungs: Respirations regular and unlabored, CTA bilaterally. No wheezes, rales or rhonchi. Heart: RRR. S1, S2 present. No murmurs, rub, S3 or S4. Abdomen: Soft, non-distended.  Extremities: No clubbing,  cyanosis or edema. PT/Radials 2+ and equal bilaterally. Psych: Normal affect. Neuro: Alert and oriented X 3. Moves all extremities spontaneously.   Diagnostics  Echocardiogram (most recent in EPIC from Dec 2014) Study Conclusions - Left ventricle: The cavity size was normal. There was focal basal hypertrophy. There was an increased relative contribution of atrial contraction to ventricular filling. - Aortic valve: There was mild stenosis. Trivial regurgitation. Mean gradient: 2mm Hg (S). Peak gradient: 78mm Hg (S). - Left atrium: The atrium was mildly to moderately dilated. - Pulmonary arteries: PA peak pressure: 22mm Hg (S).  Device interrogation today - Normal device function. Thresholds, sensing,  impedances consistent with previous measurements. Device programmed to maximize longevity. 16 mode switch episodes, 0 AHR episodes, all less than 1 minute in duration, no EGMs. No high ventricular rates noted. Device programmed at appropriate safety margins. Histogram distribution appropriate for patient activity level. Device programmed to optimize intrinsic conduction. Estimated longevity 8.5 years.   Assessment and Plan 1. Complete heart block s/p PPM implant Normal device function No programming changes made Continue routine remote PPM follow-up every 3 months Return for follow-up with Dr. Johney Frame in one year  Signed, Minda Meo, PA-C 12/18/2013, 2:13 PM

## 2014-01-08 ENCOUNTER — Encounter: Payer: Self-pay | Admitting: Internal Medicine

## 2014-03-20 ENCOUNTER — Ambulatory Visit (INDEPENDENT_AMBULATORY_CARE_PROVIDER_SITE_OTHER): Admitting: *Deleted

## 2014-03-20 DIAGNOSIS — I442 Atrioventricular block, complete: Secondary | ICD-10-CM

## 2014-03-20 NOTE — Progress Notes (Signed)
Remote pacemaker transmission.   

## 2014-03-23 LAB — MDC_IDC_ENUM_SESS_TYPE_REMOTE
Battery Remaining Longevity: 99 mo
Brady Statistic AP VP Percent: 96 %
Brady Statistic AP VS Percent: 1 %
Brady Statistic AS VS Percent: 0 %
Date Time Interrogation Session: 20150828160424
Lead Channel Impedance Value: 657 Ohm
Lead Channel Pacing Threshold Amplitude: 0.5 V
Lead Channel Pacing Threshold Amplitude: 0.75 V
Lead Channel Pacing Threshold Pulse Width: 0.4 ms
Lead Channel Pacing Threshold Pulse Width: 0.4 ms
Lead Channel Setting Sensing Sensitivity: 2.8 mV
MDC IDC MSMT BATTERY IMPEDANCE: 263 Ohm
MDC IDC MSMT BATTERY VOLTAGE: 2.77 V
MDC IDC MSMT LEADCHNL RA IMPEDANCE VALUE: 437 Ohm
MDC IDC SET LEADCHNL RA PACING AMPLITUDE: 2 V
MDC IDC SET LEADCHNL RV PACING AMPLITUDE: 2.5 V
MDC IDC SET LEADCHNL RV PACING PULSEWIDTH: 0.4 ms
MDC IDC STAT BRADY AS VP PERCENT: 4 %

## 2014-03-24 ENCOUNTER — Encounter: Payer: Self-pay | Admitting: Cardiology

## 2014-04-13 ENCOUNTER — Encounter: Payer: Self-pay | Admitting: Internal Medicine

## 2014-06-22 ENCOUNTER — Telehealth: Payer: Self-pay | Admitting: Cardiology

## 2014-06-22 ENCOUNTER — Encounter: Payer: Medicare Other | Admitting: *Deleted

## 2014-06-22 NOTE — Telephone Encounter (Signed)
Confirm remote transmission w/ pt caregiver.

## 2014-06-26 ENCOUNTER — Encounter: Payer: Self-pay | Admitting: Cardiology

## 2014-06-26 ENCOUNTER — Other Ambulatory Visit (HOSPITAL_COMMUNITY): Payer: Self-pay | Admitting: *Deleted

## 2014-06-26 DIAGNOSIS — I6523 Occlusion and stenosis of bilateral carotid arteries: Secondary | ICD-10-CM

## 2014-07-06 ENCOUNTER — Ambulatory Visit (HOSPITAL_COMMUNITY): Payer: Medicare Other | Attending: Cardiology | Admitting: Cardiology

## 2014-07-06 ENCOUNTER — Telehealth: Payer: Self-pay | Admitting: Internal Medicine

## 2014-07-06 ENCOUNTER — Encounter (HOSPITAL_COMMUNITY)

## 2014-07-06 DIAGNOSIS — F17210 Nicotine dependence, cigarettes, uncomplicated: Secondary | ICD-10-CM | POA: Diagnosis not present

## 2014-07-06 DIAGNOSIS — I10 Essential (primary) hypertension: Secondary | ICD-10-CM | POA: Insufficient documentation

## 2014-07-06 DIAGNOSIS — I6523 Occlusion and stenosis of bilateral carotid arteries: Secondary | ICD-10-CM

## 2014-07-06 DIAGNOSIS — I6529 Occlusion and stenosis of unspecified carotid artery: Secondary | ICD-10-CM | POA: Insufficient documentation

## 2014-07-06 NOTE — Progress Notes (Signed)
Carotid duplex performed 

## 2014-07-06 NOTE — Telephone Encounter (Signed)
New message ° ° ° ° ° ° ° ° °Pt would like to know if you received her transmission °

## 2014-07-06 NOTE — Telephone Encounter (Signed)
Informed pt caregiver that transmission was not received and to wait and send transmission when wirex is received. Informed pt nurse to contact office if pt started feeling bad, weak, fatigues, or having any issues w/ her heart so we could see her in the office. Nurse verbalized understanding.

## 2014-08-06 ENCOUNTER — Encounter (HOSPITAL_COMMUNITY): Payer: Self-pay | Admitting: Gastroenterology

## 2014-08-17 ENCOUNTER — Ambulatory Visit (INDEPENDENT_AMBULATORY_CARE_PROVIDER_SITE_OTHER): Payer: Medicare Other | Admitting: *Deleted

## 2014-08-17 DIAGNOSIS — I442 Atrioventricular block, complete: Secondary | ICD-10-CM

## 2014-08-17 LAB — MDC_IDC_ENUM_SESS_TYPE_REMOTE
Battery Impedance: 312 Ohm
Battery Remaining Longevity: 94 mo
Battery Voltage: 2.77 V
Brady Statistic AP VS Percent: 1 %
Brady Statistic AS VS Percent: 0 %
Lead Channel Impedance Value: 450 Ohm
Lead Channel Impedance Value: 726 Ohm
Lead Channel Pacing Threshold Pulse Width: 0.4 ms
Lead Channel Setting Pacing Amplitude: 2 V
Lead Channel Setting Pacing Amplitude: 2.5 V
Lead Channel Setting Pacing Pulse Width: 0.4 ms
Lead Channel Setting Sensing Sensitivity: 2.8 mV
MDC IDC MSMT LEADCHNL RA PACING THRESHOLD AMPLITUDE: 0.375 V
MDC IDC MSMT LEADCHNL RA PACING THRESHOLD PULSEWIDTH: 0.4 ms
MDC IDC MSMT LEADCHNL RV PACING THRESHOLD AMPLITUDE: 0.75 V
MDC IDC SESS DTM: 20160125133423
MDC IDC STAT BRADY AP VP PERCENT: 97 %
MDC IDC STAT BRADY AS VP PERCENT: 2 %

## 2014-08-17 NOTE — Progress Notes (Signed)
Remote pacemaker transmission.   

## 2014-08-19 ENCOUNTER — Telehealth: Payer: Self-pay | Admitting: Internal Medicine

## 2014-08-19 NOTE — Telephone Encounter (Signed)
New message ° ° ° ° ° °Did you get her remote transmission? °

## 2014-08-19 NOTE — Telephone Encounter (Signed)
Informed pt caregiver that transmission was received. Pt caregiver that transmission was received.

## 2014-08-26 ENCOUNTER — Encounter: Payer: Self-pay | Admitting: Cardiology

## 2014-09-01 ENCOUNTER — Encounter: Payer: Self-pay | Admitting: Internal Medicine

## 2014-12-23 ENCOUNTER — Other Ambulatory Visit: Payer: Self-pay

## 2014-12-23 ENCOUNTER — Encounter: Payer: Medicare Other | Admitting: Internal Medicine

## 2015-01-05 ENCOUNTER — Inpatient Hospital Stay (HOSPITAL_COMMUNITY)
Admission: EM | Admit: 2015-01-05 | Discharge: 2015-01-14 | DRG: 388 | Disposition: A | Discharge status: Skilled Nursing Facility | Payer: Medicare Other | Attending: Internal Medicine | Admitting: Internal Medicine

## 2015-01-05 ENCOUNTER — Encounter (HOSPITAL_COMMUNITY): Payer: Self-pay | Admitting: *Deleted

## 2015-01-05 ENCOUNTER — Emergency Department (HOSPITAL_COMMUNITY): Payer: Medicare Other

## 2015-01-05 DIAGNOSIS — E876 Hypokalemia: Secondary | ICD-10-CM | POA: Diagnosis present

## 2015-01-05 DIAGNOSIS — Z87440 Personal history of urinary (tract) infections: Secondary | ICD-10-CM

## 2015-01-05 DIAGNOSIS — K219 Gastro-esophageal reflux disease without esophagitis: Secondary | ICD-10-CM | POA: Diagnosis present

## 2015-01-05 DIAGNOSIS — Z86718 Personal history of other venous thrombosis and embolism: Secondary | ICD-10-CM

## 2015-01-05 DIAGNOSIS — Z87891 Personal history of nicotine dependence: Secondary | ICD-10-CM | POA: Diagnosis not present

## 2015-01-05 DIAGNOSIS — A047 Enterocolitis due to Clostridium difficile: Secondary | ICD-10-CM | POA: Diagnosis present

## 2015-01-05 DIAGNOSIS — Z96642 Presence of left artificial hip joint: Secondary | ICD-10-CM | POA: Diagnosis present

## 2015-01-05 DIAGNOSIS — F329 Major depressive disorder, single episode, unspecified: Secondary | ICD-10-CM | POA: Diagnosis present

## 2015-01-05 DIAGNOSIS — Z9049 Acquired absence of other specified parts of digestive tract: Secondary | ICD-10-CM | POA: Diagnosis present

## 2015-01-05 DIAGNOSIS — I714 Abdominal aortic aneurysm, without rupture: Secondary | ICD-10-CM | POA: Diagnosis present

## 2015-01-05 DIAGNOSIS — K56600 Partial intestinal obstruction, unspecified as to cause: Secondary | ICD-10-CM | POA: Diagnosis present

## 2015-01-05 DIAGNOSIS — K567 Ileus, unspecified: Secondary | ICD-10-CM | POA: Diagnosis not present

## 2015-01-05 DIAGNOSIS — Z880 Allergy status to penicillin: Secondary | ICD-10-CM | POA: Diagnosis not present

## 2015-01-05 DIAGNOSIS — Z886 Allergy status to analgesic agent status: Secondary | ICD-10-CM

## 2015-01-05 DIAGNOSIS — R627 Adult failure to thrive: Secondary | ICD-10-CM | POA: Diagnosis present

## 2015-01-05 DIAGNOSIS — F0390 Unspecified dementia without behavioral disturbance: Secondary | ICD-10-CM | POA: Diagnosis present

## 2015-01-05 DIAGNOSIS — R109 Unspecified abdominal pain: Secondary | ICD-10-CM | POA: Diagnosis present

## 2015-01-05 DIAGNOSIS — Z950 Presence of cardiac pacemaker: Secondary | ICD-10-CM

## 2015-01-05 DIAGNOSIS — G9340 Encephalopathy, unspecified: Secondary | ICD-10-CM | POA: Diagnosis present

## 2015-01-05 DIAGNOSIS — K5669 Other intestinal obstruction: Secondary | ICD-10-CM | POA: Diagnosis not present

## 2015-01-05 DIAGNOSIS — I10 Essential (primary) hypertension: Secondary | ICD-10-CM | POA: Diagnosis present

## 2015-01-05 DIAGNOSIS — Z7982 Long term (current) use of aspirin: Secondary | ICD-10-CM

## 2015-01-05 DIAGNOSIS — N390 Urinary tract infection, site not specified: Secondary | ICD-10-CM | POA: Diagnosis present

## 2015-01-05 DIAGNOSIS — K56609 Unspecified intestinal obstruction, unspecified as to partial versus complete obstruction: Secondary | ICD-10-CM | POA: Diagnosis present

## 2015-01-05 DIAGNOSIS — A0472 Enterocolitis due to Clostridium difficile, not specified as recurrent: Secondary | ICD-10-CM | POA: Clinically undetermined

## 2015-01-05 DIAGNOSIS — M109 Gout, unspecified: Secondary | ICD-10-CM | POA: Diagnosis present

## 2015-01-05 DIAGNOSIS — K560 Paralytic ileus: Secondary | ICD-10-CM | POA: Diagnosis present

## 2015-01-05 DIAGNOSIS — E039 Hypothyroidism, unspecified: Secondary | ICD-10-CM | POA: Diagnosis present

## 2015-01-05 LAB — COMPREHENSIVE METABOLIC PANEL
ALBUMIN: 3.3 g/dL — AB (ref 3.5–5.0)
ALK PHOS: 57 U/L (ref 38–126)
ALT: 22 U/L (ref 14–54)
ANION GAP: 11 (ref 5–15)
AST: 28 U/L (ref 15–41)
BILIRUBIN TOTAL: 0.6 mg/dL (ref 0.3–1.2)
BUN: 31 mg/dL — AB (ref 6–20)
CHLORIDE: 103 mmol/L (ref 101–111)
CO2: 25 mmol/L (ref 22–32)
Calcium: 8.9 mg/dL (ref 8.9–10.3)
Creatinine, Ser: 0.76 mg/dL (ref 0.44–1.00)
GFR calc Af Amer: >60 mL/min (ref >60)
GFR calc non Af Amer: >60 mL/min (ref >60)
Glucose, Bld: 130 mg/dL — ABNORMAL HIGH (ref 65–99)
POTASSIUM: 3.5 mmol/L (ref 3.5–5.1)
SODIUM: 139 mmol/L (ref 135–145)
Total Protein: 6.8 g/dL (ref 6.5–8.1)

## 2015-01-05 LAB — URINALYSIS, ROUTINE W REFLEX MICROSCOPIC
Glucose, UA: NEGATIVE mg/dL
HGB URINE DIPSTICK: NEGATIVE
Ketones, ur: NEGATIVE mg/dL
Nitrite: NEGATIVE
Protein, ur: NEGATIVE mg/dL
Specific Gravity, Urine: 1.046 — ABNORMAL HIGH (ref 1.005–1.030)
Urobilinogen, UA: 1 mg/dL (ref 0.0–1.0)
pH: 6 (ref 5.0–8.0)

## 2015-01-05 LAB — CBC WITH DIFFERENTIAL/PLATELET
BASOS PCT: 1 % (ref 0–1)
Basophils Absolute: 0.1 10*3/uL (ref 0.0–0.1)
Eosinophils Absolute: 0.2 10*3/uL (ref 0.0–0.7)
Eosinophils Relative: 2 % (ref 0–5)
HCT: 40.6 % (ref 36.0–46.0)
HEMOGLOBIN: 13.3 g/dL (ref 12.0–15.0)
LYMPHS PCT: 18 % (ref 12–46)
Lymphs Abs: 2 10*3/uL (ref 0.7–4.0)
MCH: 28.1 pg (ref 26.0–34.0)
MCHC: 32.8 g/dL (ref 30.0–36.0)
MCV: 85.8 fL (ref 78.0–100.0)
Monocytes Absolute: 1.4 10*3/uL — ABNORMAL HIGH (ref 0.1–1.0)
Monocytes Relative: 12 % (ref 3–12)
NEUTROS PCT: 67 % (ref 43–77)
Neutro Abs: 7.6 10*3/uL (ref 1.7–7.7)
Platelets: 187 10*3/uL (ref 150–400)
RBC: 4.73 MIL/uL (ref 3.87–5.11)
RDW: 13.2 % (ref 11.5–15.5)
WBC: 11.3 10*3/uL — ABNORMAL HIGH (ref 4.0–10.5)

## 2015-01-05 LAB — URINE MICROSCOPIC-ADD ON

## 2015-01-05 MED ORDER — LEVOFLOXACIN IN D5W 500 MG/100ML IV SOLN
500.0000 mg | Freq: Once | INTRAVENOUS | Status: DC
  Filled 2015-01-05: qty 100

## 2015-01-05 MED ORDER — IOHEXOL 300 MG/ML  SOLN
100.0000 mL | Freq: Once | INTRAMUSCULAR | Status: AC | PRN
  Administered 2015-01-05: 100 mL via INTRAVENOUS

## 2015-01-05 MED ORDER — SODIUM CHLORIDE 0.9 % IV BOLUS (SEPSIS)
1000.0000 mL | Freq: Once | INTRAVENOUS | Status: AC
  Administered 2015-01-05: 1000 mL via INTRAVENOUS

## 2015-01-05 MED ORDER — IOHEXOL 300 MG/ML  SOLN
50.0000 mL | Freq: Once | INTRAMUSCULAR | Status: AC | PRN
  Administered 2015-01-05: 50 mL via ORAL

## 2015-01-05 MED ORDER — ALLOPURINOL 100 MG PO TABS
100.0000 mg | ORAL_TABLET | Freq: Every day | ORAL | Status: DC
  Filled 2015-01-05 (×2): qty 1

## 2015-01-05 MED ORDER — CEFTRIAXONE SODIUM IN DEXTROSE 20 MG/ML IV SOLN
1.0000 g | INTRAVENOUS | Status: DC
  Administered 2015-01-05 – 2015-01-07 (×3): 1 g via INTRAVENOUS
  Filled 2015-01-05 (×3): qty 50

## 2015-01-05 MED ORDER — LEVOTHYROXINE SODIUM 112 MCG PO TABS
112.0000 ug | ORAL_TABLET | Freq: Every day | ORAL | Status: DC
  Filled 2015-01-05 (×2): qty 1

## 2015-01-05 MED ORDER — ACETAMINOPHEN 325 MG PO TABS
650.0000 mg | ORAL_TABLET | ORAL | Status: DC | PRN
  Administered 2015-01-07: 650 mg via ORAL
  Filled 2015-01-05: qty 2

## 2015-01-05 MED ORDER — CITALOPRAM HYDROBROMIDE 10 MG PO TABS
10.0000 mg | ORAL_TABLET | Freq: Every day | ORAL | Status: DC
  Filled 2015-01-05: qty 1

## 2015-01-05 MED ORDER — PANTOPRAZOLE SODIUM 40 MG IV SOLR
40.0000 mg | INTRAVENOUS | Status: DC
  Administered 2015-01-05 – 2015-01-08 (×4): 40 mg via INTRAVENOUS
  Filled 2015-01-05 (×6): qty 40

## 2015-01-05 MED ORDER — ONDANSETRON HCL 4 MG PO TABS: 4.0000 mg | ORAL_TABLET | Freq: Four times a day (QID) | ORAL | Status: DC | PRN

## 2015-01-05 MED ORDER — ALPRAZOLAM 0.25 MG PO TABS
0.2500 mg | ORAL_TABLET | Freq: Two times a day (BID) | ORAL | Status: DC | PRN
  Administered 2015-01-06 – 2015-01-13 (×8): 0.25 mg via ORAL
  Filled 2015-01-05 (×9): qty 1

## 2015-01-05 MED ORDER — LEVOFLOXACIN IN D5W 250 MG/50ML IV SOLN: 250.0000 mg | INTRAVENOUS | Status: DC

## 2015-01-05 MED ORDER — ONDANSETRON HCL 4 MG/2ML IJ SOLN: 4.0000 mg | Freq: Four times a day (QID) | INTRAMUSCULAR | Status: DC | PRN

## 2015-01-05 MED ORDER — DARIFENACIN HYDROBROMIDE ER 7.5 MG PO TB24
7.5000 mg | ORAL_TABLET | Freq: Every day | ORAL | Status: DC
  Administered 2015-01-06 – 2015-01-14 (×9): 7.5 mg via ORAL
  Filled 2015-01-05 (×10): qty 1

## 2015-01-05 MED ORDER — METOPROLOL TARTRATE 12.5 MG HALF TABLET
12.5000 mg | ORAL_TABLET | Freq: Two times a day (BID) | ORAL | Status: DC
  Filled 2015-01-05 (×3): qty 1

## 2015-01-05 MED ORDER — ENOXAPARIN SODIUM 40 MG/0.4ML ~~LOC~~ SOLN
40.0000 mg | SUBCUTANEOUS | Status: DC
  Administered 2015-01-05 – 2015-01-13 (×9): 40 mg via SUBCUTANEOUS
  Filled 2015-01-05 (×10): qty 0.4

## 2015-01-05 MED ORDER — DILTIAZEM HCL ER 180 MG PO CP24
180.0000 mg | ORAL_CAPSULE | Freq: Every day | ORAL | Status: DC
  Administered 2015-01-06 – 2015-01-14 (×9): 180 mg via ORAL
  Filled 2015-01-05 (×10): qty 1

## 2015-01-05 MED ORDER — SODIUM CHLORIDE 0.9 % IV SOLN
INTRAVENOUS | Status: DC
  Administered 2015-01-05: 19:00:00 via INTRAVENOUS

## 2015-01-05 NOTE — ED Notes (Signed)
Patient lives at home with family. Family called EMS stating that patient is more demented than usual, she has decreased urinary output, and the urine is brown with foul odor. This has been going on for about a week according to family.

## 2015-01-05 NOTE — H&P (Signed)
[facility] History and Physical  [name] BJY:[id number] DOB: 1916/12/17 DOA: 01/05/2015  Referring physician: Dr. Patria Mane  PCP: [name], MD   Chief Complaint: lethargy and confusion   HPI:  Pt is 81 yo female with known dementia, presented to [facility] ED by family for evaluation of several days duration of progressively worsening confusion, poor oral intake, more lethargy over the past 24 hours. Please note that pt is unable to provide history due to dementia and family provides some information. Family explains pt has had some abd pain and was pointing mostly to lower abd quadrants. Family is unable to fully describe the pain but mentions noticing foul smelling urine, no report of fevers, chills, no reports of vomiting.  In ED, pt noted to be hemodynamically stable, VSS, blood work notable for WBC 11.3 otherwise unremarkable. CT abd/pelvis notable for SBO. Surgery consulted by Dr. Patria Mane (spoke with Tresa Endo), [facility] asked to admit for further management.   Assessment and Plan: Active Problems:   Small bowel obstruction - admit to medical bed - pt looks comfortable at this time and please note that placing NGT was unsuccessful  - d/w family at bedside, agree to hold off on NGT placement for now and re assess in am  - family explains that pt was determined in the past to be poor surgical candidate and has done well with conservative management - family prefers conservative management and ensuring comfort, this is very reasonable request - allow ice chips per pt and family request - pt denies any abd pain and would only agree to take tylenol if needed  - provide IVF and antiemetics as needed   Leukocytosis - possibly related to SBO vs UTI - urine cultures pending  - place on empiric Rocephin and readjust the medical regimen as indicated  - repeat CBC in AM   Dementia - pt mental status appears to be stable and per family currently at baseline    HTN - continue metoprolol  and cardizem as per home medical regimen    GERD - place on PPI IV for now    Hypothyroidism - continue synthroid    DVT prophylaxis  - Lovenox SQ   Radiological Exams on Admission: Ct Abdomen Pelvis W Contrast 01/05/2015    Findings most consistent with small bowel obstruction without evidence of mass or ischemia.  Patchy right lower lobe airspace disease could be due to atelectasis, aspiration or pneumonia.  No finding to explain the patient's urinary symptoms.  4.4 cm infrarenal abdominal aortic aneurysm. Recommend followup by ultrasound in 1 year.    Code Status: Full Family Communication: Multiple family members at bedside  Disposition Plan: Admit for further evaluation    [name] [facility] [phone]   Review of Systems:  Pt unable to provide ROS due to dementia   Past Medical History  Diagnosis Date  . Complete heart block     s/p PPM by Dr Amil Amen 2004, recent Gen Change  07/13/10  . Small bowel obstruction   . Depression   . Hypertension   . Hypokalemia   . Recurrent UTI   . Gout   . GERD (gastroesophageal reflux disease)   . DVT (deep venous thrombosis)     Past Surgical History  Procedure Laterality Date  . Insert / replace / remove pacemaker      initial pacemaker by Dr Amil Amen 2004, replaced by [facility] for ERI 07/13/10  . Hip arthroplasty  07/02/2011    Procedure: ARTHROPLASTY BIPOLAR HIP;  Surgeon: Harvie Junior;  Location: [facility] ORS;  Service: Orthopedics;  Laterality: Left;    Social History:  reports that she quit smoking about 34 years ago. Her smoking use included Cigarettes. She has a 90 pack-year smoking history. She has never used smokeless tobacco. She reports that she does not drink alcohol or use illicit drugs.  Allergies  Allergen Reactions  . Aspirin     Prior history of stomach problems (? Ulcer), instructed to resume for stroke  . Ativan [Lorazepam] Other (See Comments)    Has opposite effect "makes her crazy"  . Penicillins Rash    Family  History  Problem Relation Age of Onset  . Stroke Father     Prior to Admission medications   Medication Sig Start Date End Date Taking? Authorizing Provider  acetaminophen (TYLENOL) 325 MG tablet Take 650 mg by mouth every 4 (four) hours as needed. For pain.    Yes Historical Provider, MD  allopurinol (ZYLOPRIM) 100 MG tablet Take 100 mg by mouth daily.     Yes Historical Provider, MD  ALPRAZolam Prudy Feeler) 0.5 MG tablet Take 0.5 tablets (0.25 mg total) by mouth 2 (two) times daily as needed for anxiety. 10/20/13  Yes Leroy Sea, MD  aspirin EC 81 MG tablet Take 1 tablet (81 mg total) by mouth daily. 06/09/13  Yes Hillis Range, MD  citalopram (CELEXA) 10 MG tablet Take 10 mg by mouth daily.     Yes Historical Provider, MD  diltiazem (DILACOR XR) 180 MG 24 hr capsule Take 180 mg by mouth daily.     Yes Historical Provider, MD  levothyroxine (SYNTHROID, LEVOTHROID) 112 MCG tablet Take 112 mcg by mouth daily before breakfast.   Yes Historical Provider, MD  metoprolol tartrate (LOPRESSOR) 25 MG tablet Take 12.5 mg by mouth 2 (two) times daily.   Yes Historical Provider, MD  pantoprazole (PROTONIX) 40 MG tablet Take 40 mg by mouth daily.     Yes Historical Provider, MD  polyethylene glycol (MIRALAX / GLYCOLAX) packet Take 17 g by mouth daily as needed. Patient taking differently: Take 17 g by mouth daily as needed for mild constipation.  10/20/13  Yes Leroy Sea, MD  VESICARE 5 MG tablet Take 1 tablet by mouth daily. 10/08/14  Yes Historical Provider, MD    Physical Exam: Filed Vitals:   01/05/15 1331 01/05/15 1555 01/05/15 1744  BP: 137/95 145/63 140/62  Pulse: 77 63 64  Temp: 97.9 F (36.6 C) 97.9 F (36.6 C) 97.4 F (36.3 C)  TempSrc: Oral Oral Oral  Resp: Height:    (1.6 m)  Weight:   73.755 kg (162 lb 9.6 oz)  SpO2: 97% 100% 98%    Physical Exam  Constitutional: Appears sleepy, NAD, easy to awake and follows commands  HENT: Normocephalic. External right and  left ear normal. Dry MM Eyes: Conjunctivae and EOM are normal. PERRLA, no scleral icterus.  Neck: Normal ROM. Neck supple. No JVD. No tracheal deviation. No thyromegaly.  CVS: RRR, S1/S2 +, no gallops, no carotid bruit.  Pulmonary: Effort and breath sounds normal, no stridor, diminished breath sounds at bases  Abdominal: Soft. BS +,  no distension, mild tenderness in epigastric area, no rebound or guarding.  Musculoskeletal: Normal range of motion.  Lymphadenopathy: No lymphadenopathy noted, cervical, inguinal. Neuro: Sleepy but easy to awake, moving all 4 extremities with no difficulty. No cranial nerve deficit. Skin: Skin is warm and dry. No rash noted. Not diaphoretic. No  erythema. No pallor.  Psychiatric: Normal mood and affect  Labs on Admission:  Basic Metabolic Panel:  Recent Labs Lab 01/05/15 1432  NA 139  K 3.5  CL 103  CO2 25  GLUCOSE 130*  BUN 31*  CREATININE 0.76  CALCIUM 8.9   Liver Function Tests:  Recent Labs Lab 01/05/15 1432  AST 28  ALT 22  ALKPHOS 57  BILITOT 0.6  PROT 6.8  ALBUMIN 3.3*   CBC:  Recent Labs Lab 01/05/15 1432  WBC 11.3*  NEUTROABS 7.6  HGB 13.3  HCT 40.6  MCV 85.8  PLT 187    EKG: pending    If 7PM-7AM, please contact night-coverage [email or web address] Password [facility] 01/05/2015, 7:49 PM

## 2015-01-05 NOTE — Progress Notes (Signed)
Two unsuccessful attempts to place NG tube. Notified MD. Order to place in IR.

## 2015-01-05 NOTE — ED Notes (Signed)
Patient is with CT unable to get urine or bladder scan patient

## 2015-01-05 NOTE — Progress Notes (Signed)
Utilization Review completed.  Rockell Faulks RN CM  

## 2015-01-05 NOTE — ED Provider Notes (Signed)
CSN: [id number]     Arrival date & time 01/05/15  1326 History   First MD Initiated Contact with Patient 01/05/15 1339     Chief Complaint  Patient presents with  . Urinary Retention     The history is provided by the patient and a relative.   Family reports dark foul-smelling urine and some increased confusion over the past several days.  Decreased oral intake today.  Patient also reports left-sided abdominal pain.  Family reports the patient never really describes abdominal pain.  No prior history of kidney stones.  Her symptoms are mild in severity.  Patient denies chest pain or cough.    Past Medical History  Diagnosis Date  . Complete heart block     s/p PPM by Dr Amil Amen 2004, recent Gen Change  07/13/10  . Small bowel obstruction   . Depression   . Hypertension   . Hypokalemia   . Recurrent UTI   . Gout   . GERD (gastroesophageal reflux disease)   . DVT (deep venous thrombosis)    Past Surgical History  Procedure Laterality Date  . Insert / replace / remove pacemaker      initial pacemaker by Dr Amil Amen 2004, replaced by [facility] for ERI 07/13/10  . Hip arthroplasty  07/02/2011    Procedure: ARTHROPLASTY BIPOLAR HIP;  Surgeon: Harvie Junior;  Location: [facility];  Service: Orthopedics;  Laterality: Left;   Family History  Problem Relation Age of Onset  . Stroke Father    History  Substance Use Topics  . Smoking status: Former Smoker -- 3.00 packs/day for 30 years    Types: Cigarettes    Quit date: 07/24/1980  . Smokeless tobacco: Never Used  . Alcohol Use: No   OB History    No data available     Review of Systems  All other systems reviewed and are negative.     Allergies  Aspirin; Ativan; and Penicillins  Home Medications   Prior to Admission medications   Medication Sig Start Date End Date Taking? Authorizing Provider  acetaminophen (TYLENOL) 325 MG tablet Take 650 mg by mouth every 4 (four) hours as needed. For pain.    Yes Historical Provider, MD   allopurinol (ZYLOPRIM) 100 MG tablet Take 100 mg by mouth daily.     Yes Historical Provider, MD  ALPRAZolam Prudy Feeler) 0.5 MG tablet Take 0.5 tablets (0.25 mg total) by mouth 2 (two) times daily as needed for anxiety. 10/20/13  Yes Leroy Sea, MD  aspirin EC 81 MG tablet Take 1 tablet (81 mg total) by mouth daily. 06/09/13  Yes Hillis Range, MD  beta carotene w/minerals (OCUVITE) tablet Take 1 tablet by mouth daily.   Yes Historical Provider, MD  ciprofloxacin (CIPRO) 500 MG tablet Take 500 mg by mouth 2 (two) times daily. 12/31/14  Yes Historical Provider, MD  citalopram (CELEXA) 10 MG tablet Take 10 mg by mouth daily.     Yes Historical Provider, MD  diltiazem (DILACOR XR) 180 MG 24 hr capsule Take 180 mg by mouth daily.     Yes Historical Provider, MD  ENSURE (ENSURE) Take 237 mLs by mouth 2 (two) times daily.     Yes Historical Provider, MD  folic acid (FOLVITE) 1 MG tablet Take 1 mg by mouth daily.     Yes Historical Provider, MD  levothyroxine (SYNTHROID, LEVOTHROID) 112 MCG tablet Take 112 mcg by mouth daily before breakfast.   Yes Historical Provider, MD  metoprolol tartrate (LOPRESSOR) 25  MG tablet Take 12.5 mg by mouth 2 (two) times daily.   Yes Historical Provider, MD  pantoprazole (PROTONIX) 40 MG tablet Take 40 mg by mouth daily.     Yes Historical Provider, MD  polyethylene glycol (MIRALAX / GLYCOLAX) packet Take 17 g by mouth daily as needed. Patient taking differently: Take 17 g by mouth daily as needed for mild constipation.  10/20/13  Yes Leroy Sea, MD  VESICARE 5 MG tablet Take 1 tablet by mouth daily. 10/08/14  Yes Historical Provider, MD   BP 137/95 mmHg  Pulse 77  Temp(Src) 97.9 F (36.6 C) (Oral)  Resp 22  SpO2 97% Physical Exam  Constitutional: She is oriented to person, place, and time. She appears well-developed and well-nourished. No distress.  HENT:  Head: Normocephalic and atraumatic.  Eyes: EOM are normal.  Neck: Normal range of motion.   Cardiovascular: Normal rate, regular rhythm and normal heart sounds.   Pulmonary/Chest: Effort normal and breath sounds normal.  Abdominal: Soft. She exhibits no distension.  Mild left-sided abdominal tenderness without guarding or rebound.  No rash present.  Musculoskeletal: Normal range of motion.  Neurological: She is alert and oriented to person, place, and time.  Skin: Skin is warm and dry.  Psychiatric: She has a normal mood and affect. Judgment normal.  Nursing note and vitals reviewed.   ED Course  Procedures (including critical care time) Labs Review Labs Reviewed  URINALYSIS, ROUTINE W REFLEX MICROSCOPIC (NOT AT [facility]) - Abnormal; Notable for the following:    Color, Urine AMBER (*)    APPearance CLOUDY (*)    Specific Gravity, Urine 1.046 (*)    Bilirubin Urine SMALL (*)    Leukocytes, UA SMALL (*)    All other components within normal limits  CBC WITH DIFFERENTIAL/PLATELET - Abnormal; Notable for the following:    WBC 11.3 (*)    Monocytes Absolute 1.4 (*)    All other components within normal limits  COMPREHENSIVE METABOLIC PANEL - Abnormal; Notable for the following:    Glucose, Bld 130 (*)    BUN 31 (*)    Albumin 3.3 (*)    All other components within normal limits  URINE MICROSCOPIC-ADD ON - Abnormal; Notable for the following:    Squamous Epithelial / LPF MANY (*)    All other components within normal limits  URINE CULTURE    Imaging Review Ct Abdomen Pelvis W Contrast  01/05/2015   CLINICAL DATA:  Urinary retention and abnormal odor to the patient urine. Symptoms for 1 week.  EXAM: CT ABDOMEN AND PELVIS WITH CONTRAST  TECHNIQUE: Multidetector CT imaging of the abdomen and pelvis was performed using the standard protocol following bolus administration of intravenous contrast.  CONTRAST:  100 mL OMNIPAQUE IOHEXOL 300 MG/ML SOLN  COMPARISON:  CT abdomen and pelvis 10/15/2013. CT lumbar spine 03/01/2010.  FINDINGS: Patchy airspace disease is seen in the right  lower lobe posteriorly. Imaged left lung is clear. No pleural or pericardial effusion is identified. There is cardiomegaly. Leads from pacing device are noted. Calcific coronary artery disease is seen. There is a small hiatal hernia.  The patient is status post cholecystectomy. Mild intra and extrahepatic biliary ductal prominence is unchanged. Calcification in the inferior right lobe of the liver is unchanged. The liver is otherwise unremarkable. There is a 0.8 cm hyperattenuating lesion in the spleen. Bilateral renal cysts are identified. The kidneys are otherwise unremarkable. The pancreas is atrophic. In the midbody of the pancreas, there is a hypo  attenuating lesion measuring 1.6 x 1.2 cm which is unchanged compared to the 2011 examination and likely the sequela of prior inflammatory process.  There is dilatation of small bowel loops up to 4.3 cm with air-fluid levels present. Distal small bowel loops are completely decompressed. Transition point appears be in the central aspect of the pelvis on image 56. No mass, pneumatosis, portal venous gas or free intraperitoneal air is identified. There is a small volume of free pelvic fluid but no abscess is seen. Surgical anastomosis in region of the cecum is noted. Liquid stool is seen in the ascending colon. Sigmoid diverticular disease is noted. The stomach is unremarkable.  An infrarenal abdominal aortic aneurysm measuring 4.4 x 4.2 cm is identified and not notably changed based on remeasurement.  No lytic or sclerotic bony lesion is seen. There is lower lumbar spondylosis with grade 1 anterolisthesis L4 on L5 due to facet degenerative change. The patient is status post left hip replacement. Degenerative disease about the right hip is noted.  IMPRESSION: Findings most consistent with small bowel obstruction without evidence of mass or ischemia.  Patchy right lower lobe airspace disease could be due to atelectasis, aspiration or pneumonia.  No finding to explain the  patient's urinary symptoms.  4.4 cm infrarenal abdominal aortic aneurysm. Recommend followup by ultrasound in 1 year. This recommendation follows ACR consensus guidelines: White Paper of the ACR Incidental Findings Committee II on Vascular Findings. J Am Coll Radiol 2013; 10:789-794.  Tiny hyper attenuating lesion the spleen is likely a hemangioma.  Status post cholecystectomy. Mild intra and extrahepatic biliary ductal dilatation is unchanged.  No change in a low attenuating lesion in the body of the pancreas since 2011 most consistent with sequela of prior infectious or inflammatory process.   Electronically Signed   By: Drusilla Kanner M.D.   On: 01/05/2015 16:01  I personally reviewed the imaging tests through PACS system I reviewed available ER/hospitalization records through the EMR    EKG Interpretation None      MDM   Final diagnoses:  Small bowel obstruction    Will evaluate with blood, urine, CT scanning given her left-sided abdominal pain.  4:34 PM Urine still pending at this time however CT scan is concerning for small bowel obstruction.  NG tube now.  I spoke with general surgery will evaluate the patient in the hospital.    Azalia Bilis, MD 01/05/15 [phone]

## 2015-01-05 NOTE — ED Notes (Signed)
Bladder scan 160 

## 2015-01-05 NOTE — ED Notes (Signed)
Bed: WA20 Expected date:  Expected time:  Means of arrival:  Comments: 52 F urinary retention, hallucinations

## 2015-01-05 NOTE — ED Notes (Signed)
Family at bedside. 

## 2015-01-06 ENCOUNTER — Inpatient Hospital Stay (HOSPITAL_COMMUNITY): Payer: Medicare Other

## 2015-01-06 DIAGNOSIS — G9340 Encephalopathy, unspecified: Secondary | ICD-10-CM | POA: Diagnosis present

## 2015-01-06 DIAGNOSIS — E039 Hypothyroidism, unspecified: Secondary | ICD-10-CM

## 2015-01-06 DIAGNOSIS — N390 Urinary tract infection, site not specified: Secondary | ICD-10-CM

## 2015-01-06 LAB — CBC
HCT: 36.3 % (ref 36.0–46.0)
HEMOGLOBIN: 11.8 g/dL — AB (ref 12.0–15.0)
MCH: 28 pg (ref 26.0–34.0)
MCHC: 32.5 g/dL (ref 30.0–36.0)
MCV: 86 fL (ref 78.0–100.0)
PLATELETS: 183 10*3/uL (ref 150–400)
RBC: 4.22 MIL/uL (ref 3.87–5.11)
RDW: 13.5 % (ref 11.5–15.5)
WBC: 9.8 10*3/uL (ref 4.0–10.5)

## 2015-01-06 LAB — BASIC METABOLIC PANEL
Anion gap: 9 (ref 5–15)
BUN: 23 mg/dL — ABNORMAL HIGH (ref 6–20)
CALCIUM: 7.7 mg/dL — AB (ref 8.9–10.3)
CO2: 24 mmol/L (ref 22–32)
Chloride: 104 mmol/L (ref 101–111)
Creatinine, Ser: 0.75 mg/dL (ref 0.44–1.00)
GFR calc Af Amer: >60 mL/min (ref >60)
GLUCOSE: 118 mg/dL — AB (ref 65–99)
Potassium: 3.3 mmol/L — ABNORMAL LOW (ref 3.5–5.1)
SODIUM: 137 mmol/L (ref 135–145)

## 2015-01-06 LAB — MAGNESIUM: Magnesium: 1.7 mg/dL (ref 1.7–2.4)

## 2015-01-06 MED ORDER — LEVOTHYROXINE SODIUM 100 MCG IV SOLR
56.0000 ug | Freq: Every day | INTRAVENOUS | Status: DC
  Administered 2015-01-06 – 2015-01-09 (×4): 56 ug via INTRAVENOUS
  Filled 2015-01-06 (×4): qty 5

## 2015-01-06 MED ORDER — SODIUM CHLORIDE 0.9 % IV SOLN
INTRAVENOUS | Status: DC
  Administered 2015-01-06 – 2015-01-13 (×10): via INTRAVENOUS
  Filled 2015-01-06 (×18): qty 1000

## 2015-01-06 MED ORDER — METOPROLOL TARTRATE 1 MG/ML IV SOLN
2.5000 mg | Freq: Three times a day (TID) | INTRAVENOUS | Status: DC
  Administered 2015-01-06 (×2): 2.5 mg via INTRAVENOUS
  Filled 2015-01-06 (×2): qty 5

## 2015-01-06 MED ORDER — POTASSIUM CHLORIDE 10 MEQ/100ML IV SOLN
10.0000 meq | INTRAVENOUS | Status: AC
  Administered 2015-01-06 (×3): 10 meq via INTRAVENOUS
  Filled 2015-01-06 (×3): qty 100

## 2015-01-06 MED ORDER — HALOPERIDOL LACTATE 5 MG/ML IJ SOLN
0.5000 mg | Freq: Once | INTRAMUSCULAR | Status: AC
  Administered 2015-01-06: 0.5 mg via INTRAVENOUS
  Filled 2015-01-06: qty 1

## 2015-01-06 MED ORDER — SIMETHICONE 40 MG/0.6ML PO SUSP
80.0000 mg | Freq: Four times a day (QID) | ORAL | Status: AC
  Administered 2015-01-06 – 2015-01-08 (×7): 80 mg via ORAL
  Filled 2015-01-06 (×10): qty 1.2

## 2015-01-06 MED ORDER — POTASSIUM CHLORIDE CRYS ER 20 MEQ PO TBCR
40.0000 meq | EXTENDED_RELEASE_TABLET | Freq: Once | ORAL | Status: AC
  Administered 2015-01-06: 40 meq via ORAL
  Filled 2015-01-06: qty 2

## 2015-01-06 NOTE — Progress Notes (Signed)
[facility] PROGRESS NOTE  Carley Abele Jentz FXJ:[id number] DOB: 1917/05/19 DOA: 01/05/2015 PCP: [name], MD  Assessment/Plan: #1 small bowel obstruction Patient with bowel movement this morning. Abdominal x-rays with dilatation of small or large bowel concerning for possible adynamic ileus. Patient's abdomen is soft. Will place patient on ice chips. DC order for NG tube placement. Follow. If patient becomes nauseous and vomits will need NG tube placement under fluoroscopy. Continue conservative measures. Replete electrolytes. Family at this time was conservative treatment and a such will hold off on surgical consultation. Follow.  #2 hypokalemia Replete.  #3 leukocytosis Likely reactive leukocytosis. WBC trending down. Patient on IV Rocephin.  #4 recent UTI Per family patient was started on ciprofloxacin for treatment for recent UTI. Patient has received approximately 5 days of oral antibiotics. Continue IV Rocephin and treat for 2-3 months to complete a course of antibiotics therapy.  #5 gastroesophageal reflux disease PPI.  #6 hypothyroidism Continue Synthroid.  #7 acute encephalopathy Patient with history of dementia. Encephalopathy improving. Continue empiric IV antibiotics. Continue supportive care. Follow.  #8 dementia Stable.  #9 hypertension Stable. Continue diltiazem. Discontinue IV metoprolol.  #9 prophylaxis PPI for GI prophylaxis. Lovenox for DVT prophylaxis.  Code Status: Full Family Communication: Updated patient, son, sisters, caretaker at bedside. Disposition Plan: Home was small bowel obstruction has resolved.   Consultants:  None  Procedures:  Abdominal x-ray 01/06/2015  CT abdomen and pelvis 01/05/2015  Antibiotics:  IV Rocephin 01/05/2015   HPI/Subjective: Per nursing and patient patient had a bowel movement this morning. Patient denies any abdominal pain. No nausea. No vomiting. Patient refusing NG tube  placement.  Objective: Filed Vitals:   01/06/15 1351  BP: 135/53  Pulse: 68  Temp: 98.5 F (36.9 C)  Resp: 18    Intake/Output Summary (Last 24 hours) at 01/06/15 1815 Last data filed at 01/06/15 1400  Gross per 24 hour  Intake    735 ml  Output    400 ml  Net    335 ml   Filed Weights   01/05/15 1744  Weight: 73.755 kg (162 lb 9.6 oz)    Exam:   General:  NAD  Cardiovascular: RRR  Respiratory: CTAB  Abdomen: Soft, nontender, distended, positive bowel sounds, no rebound, no guarding  Musculoskeletal: No clubbing cyanosis or edema.  Data Reviewed: Basic Metabolic Panel:  Recent Labs Lab 01/05/15 1432 01/06/15 0510  NA 139 137  K 3.5 3.3*  CL 103 104  CO2 25 24  GLUCOSE 130* 118*  BUN 31* 23*  CREATININE 0.76 0.75  CALCIUM 8.9 7.7*   Liver Function Tests:  Recent Labs Lab 01/05/15 1432  AST 28  ALT 22  ALKPHOS 57  BILITOT 0.6  PROT 6.8  ALBUMIN 3.3*   No results for input(s): LIPASE, AMYLASE in the last 168 hours. No results for input(s): AMMONIA in the last 168 hours. CBC:  Recent Labs Lab 01/05/15 1432 01/06/15 0510  WBC 11.3* 9.8  NEUTROABS 7.6  --   HGB 13.3 11.8*  HCT 40.6 36.3  MCV 85.8 86.0  PLT 187 183   Cardiac Enzymes: No results for input(s): CKTOTAL, CKMB, CKMBINDEX, TROPONINI in the last 168 hours. BNP (last 3 results) No results for input(s): BNP in the last 8760 hours.  ProBNP (last 3 results) No results for input(s): PROBNP in the last 8760 hours.  CBG: No results for input(s): GLUCAP in the last 168 hours.  No results found for this or any previous visit (from the past  240 hour(s)).   Studies: Ct Abdomen Pelvis W Contrast  01/05/2015   CLINICAL DATA:  Urinary retention and abnormal odor to the patient urine. Symptoms for 1 week.  EXAM: CT ABDOMEN AND PELVIS WITH CONTRAST  TECHNIQUE: Multidetector CT imaging of the abdomen and pelvis was performed using the standard protocol following bolus administration of  intravenous contrast.  CONTRAST:  100 mL OMNIPAQUE IOHEXOL 300 MG/ML SOLN  COMPARISON:  CT abdomen and pelvis 10/15/2013. CT lumbar spine 03/01/2010.  FINDINGS: Patchy airspace disease is seen in the right lower lobe posteriorly. Imaged left lung is clear. No pleural or pericardial effusion is identified. There is cardiomegaly. Leads from pacing device are noted. Calcific coronary artery disease is seen. There is a small hiatal hernia.  The patient is status post cholecystectomy. Mild intra and extrahepatic biliary ductal prominence is unchanged. Calcification in the inferior right lobe of the liver is unchanged. The liver is otherwise unremarkable. There is a 0.8 cm hyperattenuating lesion in the spleen. Bilateral renal cysts are identified. The kidneys are otherwise unremarkable. The pancreas is atrophic. In the midbody of the pancreas, there is a hypo attenuating lesion measuring 1.6 x 1.2 cm which is unchanged compared to the 2011 examination and likely the sequela of prior inflammatory process.  There is dilatation of small bowel loops up to 4.3 cm with air-fluid levels present. Distal small bowel loops are completely decompressed. Transition point appears be in the central aspect of the pelvis on image 56. No mass, pneumatosis, portal venous gas or free intraperitoneal air is identified. There is a small volume of free pelvic fluid but no abscess is seen. Surgical anastomosis in region of the cecum is noted. Liquid stool is seen in the ascending colon. Sigmoid diverticular disease is noted. The stomach is unremarkable.  An infrarenal abdominal aortic aneurysm measuring 4.4 x 4.2 cm is identified and not notably changed based on remeasurement.  No lytic or sclerotic bony lesion is seen. There is lower lumbar spondylosis with grade 1 anterolisthesis L4 on L5 due to facet degenerative change. The patient is status post left hip replacement. Degenerative disease about the right hip is noted.  IMPRESSION: Findings  most consistent with small bowel obstruction without evidence of mass or ischemia.  Patchy right lower lobe airspace disease could be due to atelectasis, aspiration or pneumonia.  No finding to explain the patient's urinary symptoms.  4.4 cm infrarenal abdominal aortic aneurysm. Recommend followup by ultrasound in 1 year. This recommendation follows ACR consensus guidelines: White Paper of the ACR Incidental Findings Committee II on Vascular Findings. J Am Coll Radiol 2013; 10:789-794.  Tiny hyper attenuating lesion the spleen is likely a hemangioma.  Status post cholecystectomy. Mild intra and extrahepatic biliary ductal dilatation is unchanged.  No change in a low attenuating lesion in the body of the pancreas since 2011 most consistent with sequela of prior infectious or inflammatory process.   Electronically Signed   By: Drusilla Kanner M.D.   On: 01/05/2015 16:01   Dg Abd 2 Views  01/06/2015   CLINICAL DATA:  Small bowel obstruction.  EXAM: ABDOMEN - 2 VIEW  COMPARISON:  Abdominal CT from yesterday  FINDINGS: Evolution of the abnormal bowel gas pattern with now diffuse small and large bowel distention. A small bowel transition point was seen previously, presumably partial obstruction given today's finding. Intraluminal contrast is not well visualized; much of it has been absorbed and is now on the urinary bladder. No pneumoperitoneum.  IMPRESSION: 1. Evolution of bowel  gas pattern to now include small and large bowel distention. Although a small bowel transition point was on the prior CT, the plain film pattern suggests an adynamic ileus. 2. Oral contrast is not seen in the colon, but much of it has been absorbed and is now in the bladder.   Electronically Signed   By: Marnee Spring M.D.   On: 01/06/2015 10:35    Scheduled Meds: . cefTRIAXone (ROCEPHIN)  IV  1 g Intravenous Q24H  . darifenacin  7.5 mg Oral Daily  . diltiazem  180 mg Oral Daily  . enoxaparin (LOVENOX) injection  40 mg Subcutaneous  Q24H  . levothyroxine  56 mcg Intravenous Daily  . metoprolol  2.5 mg Intravenous 3 times per day  . pantoprazole (PROTONIX) IV  40 mg Intravenous Q24H  . potassium chloride  10 mEq Intravenous Q1 Hr x 3   Continuous Infusions: . sodium chloride 0.9 % 1,000 mL with potassium chloride 40 mEq infusion 75 mL/hr at 01/06/15 6213    Principal Problem:   SBO (small bowel obstruction) Active Problems:   Hypothyroidism   HYPOKALEMIA   Essential hypertension   UTI (lower urinary tract infection)   Acute encephalopathy    Time spent: 35 mins    [facility] MD [facility] Pager [phone]. If 7PM-7AM, please contact night-coverage at [email or web address], password [facility] 01/06/2015, 6:15 PM  LOS: 1 day

## 2015-01-07 ENCOUNTER — Inpatient Hospital Stay (HOSPITAL_COMMUNITY): Payer: Medicare Other

## 2015-01-07 ENCOUNTER — Encounter (HOSPITAL_COMMUNITY): Payer: Self-pay | Admitting: General Surgery

## 2015-01-07 DIAGNOSIS — K567 Ileus, unspecified: Secondary | ICD-10-CM | POA: Diagnosis present

## 2015-01-07 LAB — CBC
HCT: 39.1 % (ref 36.0–46.0)
Hemoglobin: 12.8 g/dL (ref 12.0–15.0)
MCH: 28.1 pg (ref 26.0–34.0)
MCHC: 32.7 g/dL (ref 30.0–36.0)
MCV: 85.9 fL (ref 78.0–100.0)
PLATELETS: 233 10*3/uL (ref 150–400)
RBC: 4.55 MIL/uL (ref 3.87–5.11)
RDW: 13.4 % (ref 11.5–15.5)
WBC: 12.8 10*3/uL — ABNORMAL HIGH (ref 4.0–10.5)

## 2015-01-07 LAB — BASIC METABOLIC PANEL
Anion gap: 10 (ref 5–15)
BUN: 10 mg/dL (ref 6–20)
CALCIUM: 8.1 mg/dL — AB (ref 8.9–10.3)
CO2: 21 mmol/L — ABNORMAL LOW (ref 22–32)
CREATININE: 0.67 mg/dL (ref 0.44–1.00)
Chloride: 106 mmol/L (ref 101–111)
GFR calc Af Amer: >60 mL/min (ref >60)
GLUCOSE: 117 mg/dL — AB (ref 65–99)
Potassium: 4.2 mmol/L (ref 3.5–5.1)
SODIUM: 137 mmol/L (ref 135–145)

## 2015-01-07 LAB — URINE CULTURE

## 2015-01-07 LAB — MAGNESIUM: MAGNESIUM: 1.7 mg/dL (ref 1.7–2.4)

## 2015-01-07 MED ORDER — BISACODYL 10 MG RE SUPP
10.0000 mg | Freq: Once | RECTAL | Status: AC
  Administered 2015-01-07: 10 mg via RECTAL
  Filled 2015-01-07: qty 1

## 2015-01-07 MED ORDER — MAGNESIUM SULFATE 50 % IJ SOLN
3.0000 g | Freq: Once | INTRAVENOUS | Status: AC
  Administered 2015-01-07: 3 g via INTRAVENOUS
  Filled 2015-01-07: qty 6

## 2015-01-07 NOTE — Care Management Note (Signed)
Case Management Note  Patient Details  Name: [name] MRN: [id number] Date of Birth: 1916/10/10  Subjective/Objective:    81 yo female admitted with UTI and confusion                Action/Plan: CM received consult from CSW. Patient lives at hoem with grandson, at bedside, and caregivers 24/7. She has hospital bed, wheelchair, walker, BSC. [name] stated that he does not need any other DME and does not need any HH services. CM spoke with PT and no recommendation for [facility] follow up at this time. Will continue to follow   Expected Discharge Date:   (unknown)               Expected Discharge Plan:  Home/Self Care  In-House Referral:  Clinical Social Work  Discharge planning Services  CM Consult  Post Acute Care Choice:    Choice offered to:     DME Arranged:    DME Agency:     HH Arranged:    HH Agency:     Status of Service:  In process, will continue to follow  Medicare Important Message Given:    Date Medicare IM Given:    Medicare IM give by:    Date Additional Medicare IM Given:    Additional Medicare Important Message give by:     If discussed at Long Length of Stay Meetings, dates discussed:    Additional Comments:  Gerrit Halls, RN 01/07/2015, 11:54 AM

## 2015-01-07 NOTE — Progress Notes (Signed)
Patient awakens easily when staff enters room, patient and sitter, states that Pt has not slept well tonight,
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
## 2015-01-07 NOTE — Progress Notes (Signed)
Occupational Therapy Evaluation Patient Details Name: [name] MRN: [id number] DOB: 12-07-1916 Today's Date: 01/07/2015    History of Present Illness Pt is 81 yo female with known dementia, presented to [facility] ED by family for evaluation of several days duration of progressively worsening confusion, poor oral intake, more lethargy over the past 24 hours. Please note that pt is unable to provide history due to dementia and family provides some information. Family explains pt has had some abd pain and was pointing mostly to lower abd quadrants. Family is unable to fully describe the pain but mentions noticing foul smelling urine, no report of fevers, chills, no reports of vomiting.   Clinical Impression   Patient presents to OT with decreased ADL independence and safety due to the functional limitations below. Will benefit from skilled OT to maximize ADL independence and to facilitate a safe discharge plan.    Follow Up Recommendations  No OT follow up;Supervision/Assistance - 24 hour    Equipment Recommendations  None recommended by OT (has all necessary equipment at home)    Recommendations for Other Services PT consult     Precautions / Restrictions Precautions Precautions: Fall Restrictions Weight Bearing Restrictions: No      Mobility Bed Mobility               General bed mobility comments: OOB in recliner  Transfers Overall transfer level: Needs assistance Equipment used: Rolling walker (2 wheeled) Transfers: Sit to/from Stand Sit to Stand: Min assist;+2 safety/equipment         General transfer comment: appears fearful of falling    Balance                                            ADL Overall ADL's : Needs assistance/impaired Eating/Feeding: Set up;Sitting           Lower Body Bathing: Maximal assistance;Sit to/from stand       Lower Body Dressing: Maximal assistance;Sit to/from stand               Functional  mobility during ADLs: Minimal assistance;Rolling walker;Cueing for sequencing;Cueing for safety;+2 for safety/equipment General ADL Comments: Patient sitting in chair; sitter and grandson at bedside. Agreeable to evaluation. Grandson provided history. Patient ambulated about 50 ft with RW, chair follow, narrow BOS. Grandson feels they can manage at home with caregiver and his assistance and have all necessary equipment. Grandson reports patient is close to baseline. Patient unable to reach feet to doff socks; grandson reports she uses reacher to don and doff socks at home. Will bring reacher next session and she how she does the task. Patient denied need to toilet during evaluation. Wearing Pull Ups and grandson reports she wears them at home.     Vision     Perception     Praxis      Pertinent Vitals/Pain Pain Assessment: No/denies pain     Hand Dominance Right   Extremity/Trunk Assessment Upper Extremity Assessment Upper Extremity Assessment: Generalized weakness   Lower Extremity Assessment Lower Extremity Assessment: Defer to PT evaluation   Cervical / Trunk Assessment Cervical / Trunk Assessment: Kyphotic   Communication Communication Communication: HOH   Cognition Arousal/Alertness: Awake/alert Behavior During Therapy: Anxious Overall Cognitive Status: History of cognitive impairments - at baseline       Memory: Decreased short-term memory;Decreased recall of precautions  General Comments       Exercises       Shoulder Instructions      Home Living Family/patient expects to be discharged to:: Private residence Living Arrangements: Alone;Other (Comment) (hired caregiver 8:30-2:30; grandson afterwards/spends night) Available Help at Discharge: Family;Personal care attendant;Available 24 hours/day Type of Home: House Home Access: Ramped entrance     Home Layout: One level     Bathroom Shower/Tub: Chief Strategy Officer:  Handicapped height Bathroom Accessibility: Yes   Home Equipment: Environmental consultant - 4 wheels;Bedside commode;Wheelchair - manual;Hospital bed;Shower seat;Other (comment) (reacher)          Prior Functioning/Environment Level of Independence: Needs assistance  Gait / Transfers Assistance Needed: uses [phone]; occasionally needs A with mobility ADL's / Homemaking Assistance Needed: caregiver assists with bathing and dressing; pt toilets herself        OT Diagnosis: Generalized weakness;Cognitive deficits   OT Problem List: Decreased strength;Decreased activity tolerance;Impaired balance (sitting and/or standing);Decreased cognition;Decreased safety awareness   OT Treatment/Interventions: Self-care/ADL training;DME and/or AE instruction;Therapeutic activities;Patient/family education    OT Goals(Current goals can be found in the care plan section) Acute Rehab OT Goals Patient Stated Goal: none stated OT Goal Formulation: With patient/family Time For Goal Achievement: 01/21/15 Potential to Achieve Goals: Good ADL Goals Pt Will Perform Lower Body Dressing: with supervision;with adaptive equipment;sit to/from stand Pt Will Transfer to Toilet: with supervision;ambulating;bedside commode Pt Will Perform Toileting - Clothing Manipulation and hygiene: with supervision;sit to/from stand Pt Will Perform Tub/Shower Transfer: with min assist;ambulating;shower seat;Tub transfer;Shower transfer;rolling walker  OT Frequency: Min 2X/week   Barriers to D/C:            Co-evaluation              End of Session Equipment Utilized During Treatment: Engineer, water Communication: Mobility status  Activity Tolerance: Patient tolerated treatment well Patient left: in chair;with call bell/phone within reach;with nursing/sitter in room;with family/visitor present   Time: 0240-9735 OT Time Calculation (min): 16 min Charges:  OT General Charges $OT Visit: 1 Procedure OT Evaluation $Initial OT  Evaluation Tier I: 1 Procedure G-Codes:    Shannan Garfinkel A 2015/01/14, 11:58 AM

## 2015-01-07 NOTE — Consult Note (Signed)
[name] Oct 08, 1916  [id number].   Requesting MD: Dr. Irine Seal  Chief Complaint/Reason for Consult: ileus HPI: This is a 81 yo white female who has dementia and is unable to provide any history.  She was admitted 2 days ago with what appeared to be a bowel obstruction on CT scan.  Her repeat films the following day showed small and large bowel dilatation c/w adynamic ileus.  Her abdomen was less distended.  This morning, when evaluated, she was felt to be more distended.  She was made NPO and we were asked to see her.    ROS : unable to obtain due to dementia  Family History  Problem Relation Age of Onset  . Stroke Father     Past Medical History  Diagnosis Date  . Complete heart block     s/p PPM by Dr Leonia Reeves 2004, recent Gen Change  07/13/10  . Small bowel obstruction   . Depression   . Hypertension   . Hypokalemia   . Recurrent UTI   . Gout   . GERD (gastroesophageal reflux disease)   . DVT (deep venous thrombosis)     Past Surgical History  Procedure Laterality Date  . Insert / replace / remove pacemaker      initial pacemaker by Dr Leonia Reeves 2004, replaced by [facility] for ERI 07/13/10  . Hip arthroplasty  07/02/2011    Procedure: ARTHROPLASTY BIPOLAR HIP;  Surgeon: Alta Corning;  Location: [facility] ORS;  Service: Orthopedics;  Laterality: Left;    Social History:  reports that she quit smoking about 34 years ago. Her smoking use included Cigarettes. She has a 90 pack-year smoking history. She has never used smokeless tobacco. She reports that she does not drink alcohol or use illicit drugs.  Allergies:  Allergies  Allergen Reactions  . Aspirin     Prior history of stomach problems (? Ulcer), instructed to resume for stroke  . Ativan [Lorazepam] Other (See Comments)    Has opposite effect "makes her crazy"  . Penicillins Rash    Medications Prior to Admission  Medication Sig Dispense Refill  . acetaminophen (TYLENOL) 325 MG tablet Take 650 mg by mouth every 4  (four) hours as needed. For pain.     Marland Kitchen allopurinol (ZYLOPRIM) 100 MG tablet Take 100 mg by mouth daily.      Marland Kitchen ALPRAZolam (XANAX) 0.5 MG tablet Take 0.5 tablets (0.25 mg total) by mouth 2 (two) times daily as needed for anxiety. 30 tablet 0  . aspirin EC 81 MG tablet Take 1 tablet (81 mg total) by mouth daily. 90 tablet 3  . beta carotene w/minerals (OCUVITE) tablet Take 1 tablet by mouth daily.    . ciprofloxacin (CIPRO) 500 MG tablet Take 500 mg by mouth 2 (two) times daily.    . citalopram (CELEXA) 10 MG tablet Take 10 mg by mouth daily.      Marland Kitchen diltiazem (DILACOR XR) 180 MG 24 hr capsule Take 180 mg by mouth daily.      Marland Kitchen ENSURE (ENSURE) Take 237 mLs by mouth 2 (two) times daily.      . folic acid (FOLVITE) 1 MG tablet Take 1 mg by mouth daily.      Marland Kitchen levothyroxine (SYNTHROID, LEVOTHROID) 112 MCG tablet Take 112 mcg by mouth daily before breakfast.    . metoprolol tartrate (LOPRESSOR) 25 MG tablet Take 12.5 mg by mouth 2 (two) times daily.    . pantoprazole (PROTONIX) 40 MG tablet Take 40 mg  by mouth daily.      . polyethylene glycol (MIRALAX / GLYCOLAX) packet Take 17 g by mouth daily as needed. (Patient taking differently: Take 17 g by mouth daily as needed for mild constipation. ) 14 each 0  . VESICARE 5 MG tablet Take 1 tablet by mouth daily.      Blood pressure 157/80, pulse 88, temperature 97.8 F (36.6 C), temperature source Oral, resp. rate 20, height 5' 3"  (1.6 m), weight 73.755 kg (162 lb 9.6 oz), SpO2 97 %. Physical Exam: General: pleasant, WD, WN white female who is laying in bed in NAD HEENT: head is normocephalic, atraumatic.  Sclera of left eye noninjected.  Right eye is covered with a patch  PERRL of left eye.  Ears and nose without any masses or lesions.  Mouth is pink and moist Heart: regular, rate, and rhythm.  Normal s1,s2. No obvious murmurs, gallops, or rubs noted.  Palpable radial and pedal pulses bilaterally Lungs: CTAB, no wheezes, rhonchi, or rales noted.   Respiratory effort nonlabored Abd: soft, NT, distended with some tympany, +BS, no masses, hernias, or organomegaly MS: all 4 extremities are symmetrical with no cyanosis, clubbing, or edema. Skin: warm and dry with no masses, lesions, or rashes Psych: demented     Results for orders placed or performed during the hospital encounter of 01/05/15 (from the past 48 hour(s))  Basic metabolic panel     Status: Abnormal   Collection Time: 01/06/15  5:10 AM  Result Value Ref Range   Sodium 137 135 - 145 mmol/L   Potassium 3.3 (L) 3.5 - 5.1 mmol/L   Chloride 104 101 - 111 mmol/L   CO2 24 22 - 32 mmol/L   Glucose, Bld 118 (H) 65 - 99 mg/dL   BUN 23 (H) 6 - 20 mg/dL   Creatinine, Ser 0.75 0.44 - 1.00 mg/dL   Calcium 7.7 (L) 8.9 - 10.3 mg/dL   GFR calc non Af Amer >60 >60 mL/min   GFR calc Af Amer >60 >60 mL/min    Comment: (NOTE) The eGFR has been calculated using the CKD EPI equation. This calculation has not been validated in all clinical situations. eGFR's persistently <60 mL/min signify possible Chronic Kidney Disease.    Anion gap 9 5 - 15  CBC     Status: Abnormal   Collection Time: 01/06/15  5:10 AM  Result Value Ref Range   WBC 9.8 4.0 - 10.5 K/uL   RBC 4.22 3.87 - 5.11 MIL/uL   Hemoglobin 11.8 (L) 12.0 - 15.0 g/dL   HCT 36.3 36.0 - 46.0 %   MCV 86.0 78.0 - 100.0 fL   MCH 28.0 26.0 - 34.0 pg   MCHC 32.5 30.0 - 36.0 g/dL   RDW 13.5 11.5 - 15.5 %   Platelets 183 150 - 400 K/uL  Magnesium     Status: None   Collection Time: 01/06/15  6:02 PM  Result Value Ref Range   Magnesium 1.7 1.7 - 2.4 mg/dL  CBC     Status: Abnormal   Collection Time: 01/07/15  5:29 AM  Result Value Ref Range   WBC 12.8 (H) 4.0 - 10.5 K/uL   RBC 4.55 3.87 - 5.11 MIL/uL   Hemoglobin 12.8 12.0 - 15.0 g/dL   HCT 39.1 36.0 - 46.0 %   MCV 85.9 78.0 - 100.0 fL   MCH 28.1 26.0 - 34.0 pg   MCHC 32.7 30.0 - 36.0 g/dL   RDW 13.4 11.5 - 15.5 %  Platelets 233 150 - 400 K/uL  Basic metabolic panel      Status: Abnormal   Collection Time: 01/07/15  5:29 AM  Result Value Ref Range   Sodium 137 135 - 145 mmol/L   Potassium 4.2 3.5 - 5.1 mmol/L    Comment: DELTA CHECK NOTED REPEATED TO VERIFY NO VISIBLE HEMOLYSIS    Chloride 106 101 - 111 mmol/L   CO2 21 (L) 22 - 32 mmol/L   Glucose, Bld 117 (H) 65 - 99 mg/dL   BUN 10 6 - 20 mg/dL   Creatinine, Ser 0.67 0.44 - 1.00 mg/dL   Calcium 8.1 (L) 8.9 - 10.3 mg/dL   GFR calc non Af Amer >60 >60 mL/min   GFR calc Af Amer >60 >60 mL/min    Comment: (NOTE) The eGFR has been calculated using the CKD EPI equation. This calculation has not been validated in all clinical situations. eGFR's persistently <60 mL/min signify possible Chronic Kidney Disease.    Anion gap 10 5 - 15  Magnesium     Status: None   Collection Time: 01/07/15  5:29 AM  Result Value Ref Range   Magnesium 1.7 1.7 - 2.4 mg/dL   Ct Abdomen Pelvis W Contrast  01/05/2015   CLINICAL DATA:  Urinary retention and abnormal odor to the patient urine. Symptoms for 1 week.  EXAM: CT ABDOMEN AND PELVIS WITH CONTRAST  TECHNIQUE: Multidetector CT imaging of the abdomen and pelvis was performed using the standard protocol following bolus administration of intravenous contrast.  CONTRAST:  100 mL OMNIPAQUE IOHEXOL 300 MG/ML SOLN  COMPARISON:  CT abdomen and pelvis 10/15/2013. CT lumbar spine 03/01/2010.  FINDINGS: Patchy airspace disease is seen in the right lower lobe posteriorly. Imaged left lung is clear. No pleural or pericardial effusion is identified. There is cardiomegaly. Leads from pacing device are noted. Calcific coronary artery disease is seen. There is a small hiatal hernia.  The patient is status post cholecystectomy. Mild intra and extrahepatic biliary ductal prominence is unchanged. Calcification in the inferior right lobe of the liver is unchanged. The liver is otherwise unremarkable. There is a 0.8 cm hyperattenuating lesion in the spleen. Bilateral renal cysts are identified. The  kidneys are otherwise unremarkable. The pancreas is atrophic. In the midbody of the pancreas, there is a hypo attenuating lesion measuring 1.6 x 1.2 cm which is unchanged compared to the 2011 examination and likely the sequela of prior inflammatory process.  There is dilatation of small bowel loops up to 4.3 cm with air-fluid levels present. Distal small bowel loops are completely decompressed. Transition point appears be in the central aspect of the pelvis on image 56. No mass, pneumatosis, portal venous gas or free intraperitoneal air is identified. There is a small volume of free pelvic fluid but no abscess is seen. Surgical anastomosis in region of the cecum is noted. Liquid stool is seen in the ascending colon. Sigmoid diverticular disease is noted. The stomach is unremarkable.  An infrarenal abdominal aortic aneurysm measuring 4.4 x 4.2 cm is identified and not notably changed based on remeasurement.  No lytic or sclerotic bony lesion is seen. There is lower lumbar spondylosis with grade 1 anterolisthesis L4 on L5 due to facet degenerative change. The patient is status post left hip replacement. Degenerative disease about the right hip is noted.  IMPRESSION: Findings most consistent with small bowel obstruction without evidence of mass or ischemia.  Patchy right lower lobe airspace disease could be due to atelectasis, aspiration or pneumonia.  No finding to explain the patient's urinary symptoms.  4.4 cm infrarenal abdominal aortic aneurysm. Recommend followup by ultrasound in 1 year. This recommendation follows ACR consensus guidelines: White Paper of the ACR Incidental Findings Committee II on Vascular Findings. J Am Coll Radiol 2013; 10:789-794.  Tiny hyper attenuating lesion the spleen is likely a hemangioma.  Status post cholecystectomy. Mild intra and extrahepatic biliary ductal dilatation is unchanged.  No change in a low attenuating lesion in the body of the pancreas since 2011 most consistent with  sequela of prior infectious or inflammatory process.   Electronically Signed   By: Inge Rise M.D.   On: 01/05/2015 16:01   Dg Abd 2 Views  01/07/2015   CLINICAL DATA:  Distended abdomen.  EXAM: ABDOMEN - 2 VIEW  COMPARISON:  01/06/2015 and CT 01/05/2015  FINDINGS: Surgical clips over the right upper quadrant. Air is present throughout the colon. No dilated colonic loops. There is new air-filled prominent bowel loop in the right upper quadrant difficult to distinguish whether this is larger small-bowel but likely dilated small bowel. Overall improvement compared to the previous exam with less air-filled dilated small bowel loops. Several scattered air-fluid levels on the decubitus film. No evidence of free peritoneal air. Remainder of the exam is unchanged.  IMPRESSION: Interval improvement with less number of dilated air-filled small bowel loops. Less overall colonic distention. Findings may be due to improving ileus or obstruction.   Electronically Signed   By: Marin Olp M.D.   On: 01/07/2015 15:29   Dg Abd 2 Views  01/06/2015   CLINICAL DATA:  Small bowel obstruction.  EXAM: ABDOMEN - 2 VIEW  COMPARISON:  Abdominal CT from yesterday  FINDINGS: Evolution of the abnormal bowel gas pattern with now diffuse small and large bowel distention. A small bowel transition point was seen previously, presumably partial obstruction given today's finding. Intraluminal contrast is not well visualized; much of it has been absorbed and is now on the urinary bladder. No pneumoperitoneum.  IMPRESSION: 1. Evolution of bowel gas pattern to now include small and large bowel distention. Although a small bowel transition point was on the prior CT, the plain film pattern suggests an adynamic ileus. 2. Oral contrast is not seen in the colon, but much of it has been absorbed and is now in the bladder.   Electronically Signed   By: Monte Fantasia M.D.   On: 01/06/2015 10:35       Assessment/Plan 1. Adynamic  ileus  -patient had repeat films actually today that show improvement from yesterday's films.  Her small bowel is much less distended and so is her colon.  -she denies any nausea and has no abdominal pain.  She can probably try some clear liquids and see how she does -mobilize as much as possible -try dulcolax suppository to see if this may stimulate her gut. -keep K+ up.  K+ is actually 4.2 today. -will follow  2.  Dementia 3.  HTN 4.  Complete heart block 5.  Patch right eye  OSBORNE,KELLY E 01/07/2015, 3:37 PM Pager: [phone]  Agree with above. Sitter in room.  The patient does not know what is going on. Has patch on right eye.  Alphonsa Overall, MD, [facility] Surgery Pager: [phone] Office phone:  [phone]

## 2015-01-07 NOTE — Progress Notes (Signed)
Chart reviewed and noted no PT follow up needed.  CSW to sign off- please contact us if SW needs arise. Reece Levy, MSW, [facility] [phone]

## 2015-01-07 NOTE — Evaluation (Signed)
Physical Therapy Evaluation Patient Details Name: [name] MRN: [id number] DOB: December 30, 1916 Today's Date: 01/07/2015   History of Present Illness  Pt is 81 yo female with known dementia, presented to [facility] ED by family for evaluation of several days duration of progressively worsening confusion, poor oral intake, more lethargy over the past 24 hours. Please note that pt is unable to provide history due to dementia and family provides some information. Family explains pt has had some abd pain and was pointing mostly to lower abd quadrants. Family is unable to fully describe the pain but mentions noticing foul smelling urine, no report of fevers, chills, no reports of vomiting.  Clinical Impression  Pt admitted with above diagnosis. Pt currently with functional limitations due to the deficits listed below (see PT Problem List). Pt ambulated 50' with RW and min A for safety. Pt's grandson is her caregiver and feels he can manage pt at home.  Pt will benefit from skilled PT to increase their independence and safety with mobility to allow discharge to the venue listed below.       Follow Up Recommendations No PT follow up    Equipment Recommendations  None recommended by PT    Recommendations for Other Services OT consult     Precautions / Restrictions Precautions Precautions: Fall Restrictions Weight Bearing Restrictions: No      Mobility  Bed Mobility               General bed mobility comments: OOB in recliner  Transfers Overall transfer level: Needs assistance Equipment used: Rolling walker (2 wheeled) Transfers: Sit to/from Stand Sit to Stand: Min assist;+2 safety/equipment         General transfer comment: appears fearful of falling  Ambulation/Gait Ambulation/Gait assistance: Min guard Ambulation Distance (Feet): 50 Feet Assistive device: Rolling walker (2 wheeled) Gait Pattern/deviations: Step-to pattern;Narrow base of support;Decreased stride  length;Decreased step length - left;Decreased step length - right   Gait velocity interpretation: at or above normal speed for age/gender General Gait Details: forward flexed head, cues to lift head and increase step length  Stairs            Wheelchair Mobility    Modified Rankin (Stroke Patients Only)       Balance Overall balance assessment: Needs assistance Sitting-balance support: No upper extremity supported Sitting balance-Leahy Scale: Good       Standing balance-Leahy Scale: Poor                               Pertinent Vitals/Pain Pain Assessment: No/denies pain    Home Living Family/patient expects to be discharged to:: Private residence Living Arrangements: Alone;Other (Comment) (hired caregiver 8:30-2:30; grandson afterwards/spends night) Available Help at Discharge: Family;Personal care attendant;Available 24 hours/day Type of Home: House Home Access: Ramped entrance     Home Layout: One level Home Equipment: Walker - 4 wheels;Bedside commode;Wheelchair - manual;Hospital bed;Shower seat;Other (comment) Lexicographer)      Prior Function Level of Independence: Needs assistance   Gait / Transfers Assistance Needed: uses [phone]; occasionally needs A with mobility  ADL's / Homemaking Assistance Needed: caregiver assists with bathing and dressing; pt toilets herself        Hand Dominance   Dominant Hand: Right    Extremity/Trunk Assessment   Upper Extremity Assessment: Generalized weakness           Lower Extremity Assessment: Overall WFL for tasks assessed  Cervical / Trunk Assessment: Kyphotic  Communication   Communication: HOH  Cognition Arousal/Alertness: Awake/alert Behavior During Therapy: Anxious Overall Cognitive Status: History of cognitive impairments - at baseline       Memory: Decreased short-term memory;Decreased recall of precautions              General Comments      Exercises         Assessment/Plan    PT Assessment Patient needs continued PT services  PT Diagnosis Generalized weakness   PT Problem List Decreased activity tolerance;Decreased balance;Decreased cognition;Decreased mobility;Decreased safety awareness  PT Treatment Interventions Gait training;Functional mobility training   PT Goals (Current goals can be found in the Care Plan section) Acute Rehab PT Goals Patient Stated Goal: per grandson -to be able to walk outside on the porch PT Goal Formulation: With family Time For Goal Achievement: 01/21/15 Potential to Achieve Goals: Fair    Frequency Min 3X/week   Barriers to discharge        Co-evaluation PT/OT/SLP Co-Evaluation/Treatment: Yes Reason for Co-Treatment: Necessary to address cognition/behavior during functional activity PT goals addressed during session: Mobility/safety with mobility;Balance;Proper use of DME         End of Session Equipment Utilized During Treatment: Gait belt Activity Tolerance: Patient limited by fatigue Patient left: in chair;with call bell/phone within reach;with family/visitor present Nurse Communication: Mobility status         Time: 5883-2549 PT Time Calculation (min) (ACUTE ONLY): 16 min   Charges:   PT Evaluation $Initial PT Evaluation Tier I: 1 Procedure     PT G CodesTamala Ser 01/07/2015, 12:15 PM [phone]

## 2015-01-07 NOTE — Progress Notes (Signed)
[facility] PROGRESS NOTE  [name] Coach AUQ:[id number] DOB: [birth year] DOA: 01/05/2015 PCP: [name], MD  Assessment/Plan: #1 small bowel obstruction vs adynamic ileus Patient with bowel movement yesterday morning none today. Abdominal x-rays from 01/06/2015, with dilatation of small or large bowel concerning for possible adynamic ileus. Patient's abdomen distended and more firm than it was yesterday. Patient on clear liquids. Change diet to ice chips only. Repeat abdominal films today. If patient becomes nauseous and vomits will need NG tube placement under fluoroscopy. Continue conservative measures. Replete electrolytes keep potassium greater than 4 and magnesium greater than 2. Family at this time wants conservative treatment. Patient seems to have a worsening ileus and a such will consult with general surgery for further evaluation and management. Follow.  #2 hypokalemia Repleted.  #3 leukocytosis Likely reactive leukocytosis. WBC trending down. D/C IV Rocephin after today's dose..  #4 recent UTI Per family patient was started on ciprofloxacin for treatment for recent UTI. Patient has received approximately 5 days of oral antibiotics. D/C IV Rocephin after today's dose.   #5 gastroesophageal reflux disease PPI.  #6 hypothyroidism Continue Synthroid.  #7 acute encephalopathy Patient with history of dementia. Encephalopathy improving. D/C empiric IV antibiotics after today's dose. Continue supportive care. Follow.  #8 dementia Stable.  #9 hypertension Stable. Continue diltiazem.   #9 prophylaxis PPI for GI prophylaxis. Lovenox for DVT prophylaxis.  Code Status: Full Family Communication: Updated patient, son at bedside. Disposition Plan: Home when ileus/ small bowel obstruction has resolved.   Consultants:  None  Procedures:  Abdominal x-ray 01/06/2015  CT abdomen and pelvis 01/05/2015  Antibiotics:  IV Rocephin 01/05/2015  >>>>01/07/15  HPI/Subjective: Patient tolerated clears. No abdominal pain. No nausea, no emesis.  Objective: Filed Vitals:   01/07/15 0621  BP: 157/80  Pulse: 88  Temp: 97.8 F (36.6 C)  Resp: 20    Intake/Output Summary (Last 24 hours) at 01/07/15 1202 Last data filed at 01/07/15 1158  Gross per 24 hour  Intake   1820 ml  Output   1450 ml  Net    370 ml   Filed Weights   01/05/15 1744  Weight: 73.755 kg (162 lb 9.6 oz)    Exam:   General:  NAD  Cardiovascular: RRR  Respiratory: CTAB  Abdomen: Soft, nontender, distended, hypoactive bowel sounds, no rebound, no guarding  Musculoskeletal: No clubbing cyanosis or edema.  Data Reviewed: Basic Metabolic Panel:  Recent Labs Lab 01/05/15 1432 01/06/15 0510 01/06/15 1802 01/07/15 0529  NA 139 137  --  137  K 3.5 3.3*  --  4.2  CL 103 104  --  106  CO2 25 24  --  21*  GLUCOSE 130* 118*  --  117*  BUN 31* 23*  --  10  CREATININE 0.76 0.75  --  0.67  CALCIUM 8.9 7.7*  --  8.1*  MG  --   --  1.7 1.7   Liver Function Tests:  Recent Labs Lab 01/05/15 1432  AST 28  ALT 22  ALKPHOS 57  BILITOT 0.6  PROT 6.8  ALBUMIN 3.3*   No results for input(s): LIPASE, AMYLASE in the last 168 hours. No results for input(s): AMMONIA in the last 168 hours. CBC:  Recent Labs Lab 01/05/15 1432 01/06/15 0510 01/07/15 0529  WBC 11.3* 9.8 12.8*  NEUTROABS 7.6  --   --   HGB 13.3 11.8* 12.8  HCT 40.6 36.3 39.1  MCV 85.8 86.0 85.9  PLT 187 183 233   Cardiac  Enzymes: No results for input(s): CKTOTAL, CKMB, CKMBINDEX, TROPONINI in the last 168 hours. BNP (last 3 results) No results for input(s): BNP in the last 8760 hours.  ProBNP (last 3 results) No results for input(s): PROBNP in the last 8760 hours.  CBG: No results for input(s): GLUCAP in the last 168 hours.  Recent Results (from the past 240 hour(s))  Urine culture     Status: None (Preliminary result)   Collection Time: 01/05/15  2:27 PM  Result Value  Ref Range Status   Specimen Description URINE, CLEAN CATCH  Final   Special Requests NONE  Final   Culture   Final    Culture reincubated for better growth Performed at [facility]    Report Status PENDING  Incomplete     Studies: Ct Abdomen Pelvis W Contrast  01/05/2015   CLINICAL DATA:  Urinary retention and abnormal odor to the patient urine. Symptoms for 1 week.  EXAM: CT ABDOMEN AND PELVIS WITH CONTRAST  TECHNIQUE: Multidetector CT imaging of the abdomen and pelvis was performed using the standard protocol following bolus administration of intravenous contrast.  CONTRAST:  100 mL OMNIPAQUE IOHEXOL 300 MG/ML SOLN  COMPARISON:  CT abdomen and pelvis 10/15/2013. CT lumbar spine 03/01/2010.  FINDINGS: Patchy airspace disease is seen in the right lower lobe posteriorly. Imaged left lung is clear. No pleural or pericardial effusion is identified. There is cardiomegaly. Leads from pacing device are noted. Calcific coronary artery disease is seen. There is a small hiatal hernia.  The patient is status post cholecystectomy. Mild intra and extrahepatic biliary ductal prominence is unchanged. Calcification in the inferior right lobe of the liver is unchanged. The liver is otherwise unremarkable. There is a 0.8 cm hyperattenuating lesion in the spleen. Bilateral renal cysts are identified. The kidneys are otherwise unremarkable. The pancreas is atrophic. In the midbody of the pancreas, there is a hypo attenuating lesion measuring 1.6 x 1.2 cm which is unchanged compared to the 2011 examination and likely the sequela of prior inflammatory process.  There is dilatation of small bowel loops up to 4.3 cm with air-fluid levels present. Distal small bowel loops are completely decompressed. Transition point appears be in the central aspect of the pelvis on image 56. No mass, pneumatosis, portal venous gas or free intraperitoneal air is identified. There is a small volume of free pelvic fluid but no abscess is  seen. Surgical anastomosis in region of the cecum is noted. Liquid stool is seen in the ascending colon. Sigmoid diverticular disease is noted. The stomach is unremarkable.  An infrarenal abdominal aortic aneurysm measuring 4.4 x 4.2 cm is identified and not notably changed based on remeasurement.  No lytic or sclerotic bony lesion is seen. There is lower lumbar spondylosis with grade 1 anterolisthesis L4 on L5 due to facet degenerative change. The patient is status post left hip replacement. Degenerative disease about the right hip is noted.  IMPRESSION: Findings most consistent with small bowel obstruction without evidence of mass or ischemia.  Patchy right lower lobe airspace disease could be due to atelectasis, aspiration or pneumonia.  No finding to explain the patient's urinary symptoms.  4.4 cm infrarenal abdominal aortic aneurysm. Recommend followup by ultrasound in 1 year. This recommendation follows ACR consensus guidelines: White Paper of the ACR Incidental Findings Committee II on Vascular Findings. J Am Coll Radiol 2013; 10:789-794.  Tiny hyper attenuating lesion the spleen is likely a hemangioma.  Status post cholecystectomy. Mild intra and extrahepatic biliary ductal dilatation  is unchanged.  No change in a low attenuating lesion in the body of the pancreas since 2011 most consistent with sequela of prior infectious or inflammatory process.   Electronically Signed   By: Drusilla Kanner M.D.   On: 01/05/2015 16:01   Dg Abd 2 Views  01/06/2015   CLINICAL DATA:  Small bowel obstruction.  EXAM: ABDOMEN - 2 VIEW  COMPARISON:  Abdominal CT from yesterday  FINDINGS: Evolution of the abnormal bowel gas pattern with now diffuse small and large bowel distention. A small bowel transition point was seen previously, presumably partial obstruction given today's finding. Intraluminal contrast is not well visualized; much of it has been absorbed and is now on the urinary bladder. No pneumoperitoneum.  IMPRESSION:  1. Evolution of bowel gas pattern to now include small and large bowel distention. Although a small bowel transition point was on the prior CT, the plain film pattern suggests an adynamic ileus. 2. Oral contrast is not seen in the colon, but much of it has been absorbed and is now in the bladder.   Electronically Signed   By: Marnee Spring M.D.   On: 01/06/2015 10:35    Scheduled Meds: . cefTRIAXone (ROCEPHIN)  IV  1 g Intravenous Q24H  . darifenacin  7.5 mg Oral Daily  . diltiazem  180 mg Oral Daily  . enoxaparin (LOVENOX) injection  40 mg Subcutaneous Q24H  . levothyroxine  56 mcg Intravenous Daily  . pantoprazole (PROTONIX) IV  40 mg Intravenous Q24H  . simethicone  80 mg Oral QID   Continuous Infusions: . sodium chloride 0.9 % 1,000 mL with potassium chloride 40 mEq infusion 75 mL/hr at 01/07/15 0212    Principal Problem:   SBO (small bowel obstruction) Active Problems:   Hypothyroidism   HYPOKALEMIA   Essential hypertension   UTI (lower urinary tract infection)   Acute encephalopathy    Time spent: 35 mins    [facility] MD [facility] Pager [phone]. If 7PM-7AM, please contact night-coverage at [email or web address], password [facility] 01/07/2015, 12:02 PM  LOS: 2 days

## 2015-01-07 NOTE — Progress Notes (Addendum)
Notified Donnamarie Poag NP, per text that EKG completed, and resulted. Patient in bed with bed alarm on, patient with decreased agitation, on occasion patient will attempt to get up, patient redirected with success.

## 2015-01-07 NOTE — Clinical Social Work Note (Addendum)
Clinical Social Work Assessment  Patient Details  Name: [name] MRN: [id number] Date of Birth: [birth year]  Date of referral:  01/07/15               Reason for consult:  Discharge Planning                Permission sought to share information with:  Family Supports Permission granted to share information::  Yes, Verbal Permission Granted  Name::      (family- grandson and sisters)  Agency::     Relationship::     Contact Information:     Housing/Transportation Living arrangements for the past 2 months:  Single Family Home Source of Information:  Adult Children Patient Interpreter Needed:  None Criminal Activity/Legal Involvement Pertinent to Current Situation/Hospitalization:  No - Comment as needed Significant Relationships:    Lives with:  Other (Comment) (grandson lives next door and stays with her overnight.) Do you feel safe going back to the place where you live?  No Need for family participation in patient care:  Yes (Comment)  Care giving concerns:  Possible need for SNF - PT pending   Social Worker assessment / plan:  Met with patient alone initially- she was pleasant but confused- "there's something going on in [place] and I think my family is involved." CSW returned when her family arrived- grandson Shanon Brow, reports he lives next door and is her 53. He spends the night with her and has a caregiver with her during the day 8:30-2:30. They are hoping to take her home at dc and at this time are awaiting PT/OT evals. Per family, she has a walker, hospital bed, potty chair, wheelchair. Discussed possible SNF and HH options and will await PT and OT.   Employment status:  Retired Forensic scientist:  Medicare PT Recommendations:  Not assessed at this time [place] / Referral to community resources:     Patient/Family's Response to care:  Family is supportive and reports her confusion comes when she gets a uti- otherwise she is very  alert/oriented  Patient/Family's Understanding of and Emotional Response to Diagnosis, Current Treatment, and Prognosis:  Family is familiar with her confusion and paranoia that come with the uti- "she's had these before". THey understand treatment plan and are hopeful to take her home at dc.    Emotional Assessment Appearance:  Appears younger than stated age Attitude/Demeanor/Rapport:  Unable to Assess Affect (typically observed):  Calm, Quiet, Pleasant Orientation:  Oriented to Self Alcohol / Substance use:  Not Applicable Psych involvement (Current and /or in the community):  No (Comment)  Discharge Needs  Concerns to be addressed:  Discharge Planning Concerns Readmission within the last 30 days:  No Current discharge risk:  Physical Impairment, Cognitively Impaired Barriers to Discharge:  No Barriers Identified   Ludwig Clarks, LCSW 01/07/2015, 11:24 AM

## 2015-01-08 LAB — CBC
HCT: 37.5 % (ref 36.0–46.0)
Hemoglobin: 12.4 g/dL (ref 12.0–15.0)
MCH: 28.6 pg (ref 26.0–34.0)
MCHC: 33.1 g/dL (ref 30.0–36.0)
MCV: 86.6 fL (ref 78.0–100.0)
Platelets: 202 10*3/uL (ref 150–400)
RBC: 4.33 MIL/uL (ref 3.87–5.11)
RDW: 13.5 % (ref 11.5–15.5)
WBC: 8.7 10*3/uL (ref 4.0–10.5)

## 2015-01-08 LAB — BASIC METABOLIC PANEL
ANION GAP: 4 — AB (ref 5–15)
BUN: 7 mg/dL (ref 6–20)
CALCIUM: 7.9 mg/dL — AB (ref 8.9–10.3)
CO2: 24 mmol/L (ref 22–32)
Chloride: 107 mmol/L (ref 101–111)
Creatinine, Ser: 0.82 mg/dL (ref 0.44–1.00)
GFR, EST NON AFRICAN AMERICAN: 59 mL/min — AB (ref >60)
Glucose, Bld: 114 mg/dL — ABNORMAL HIGH (ref 65–99)
POTASSIUM: 4.6 mmol/L (ref 3.5–5.1)
Sodium: 135 mmol/L (ref 135–145)

## 2015-01-08 LAB — MAGNESIUM: MAGNESIUM: 2.2 mg/dL (ref 1.7–2.4)

## 2015-01-08 MED ORDER — POLYETHYLENE GLYCOL 3350 17 G PO PACK
17.0000 g | PACK | Freq: Every day | ORAL | Status: DC
  Administered 2015-01-08 – 2015-01-11 (×4): 17 g via ORAL
  Filled 2015-01-08 (×5): qty 1

## 2015-01-08 NOTE — Progress Notes (Signed)
[facility] PROGRESS NOTE  [name] ZOX:[id number] DOB: Feb 12, 1917 DOA: 01/05/2015 PCP: [name], MD  Assessment/Plan: #1 small bowel obstruction vs adynamic ileus Patient with bowel movement yesterday morning none today. Abdominal x-rays from 01/06/2015, with dilatation of small or large bowel concerning for possible adynamic ileus. Abdominal x-ray from 01/07/2015 with improvement with less number of dilated air-filled small bowel loops and less overall colonic distention. Patient's abdomen with some distention in the right lower quadrant and soft. Patient tolerating clears. Continue conservative measures. Replete electrolytes keep potassium greater than 4 and magnesium greater than 2. Family at this time wants conservative treatment. Advance to a full liquid diet. Place on MiraLAX daily. Monitor clinically.   #2 hypokalemia Repleted.  #3 leukocytosis Likely reactive leukocytosis. WBC trending down.  #4 recent UTI Per family patient was started on ciprofloxacin for treatment for recent UTI. Patient has received approximately 7 days of antibiotics.  #5 gastroesophageal reflux disease PPI.  #6 hypothyroidism Continue Synthroid.  #7 acute encephalopathy Patient with history of dementia. Encephalopathy improving. D/C'd empiric IV antibiotics yesterday. Continue supportive care. Follow.  #8 dementia Stable.  #9 hypertension Stable. Continue diltiazem.   #9 prophylaxis PPI for GI prophylaxis. Lovenox for DVT prophylaxis.  Code Status: Full Family Communication: Updated patient, no family at bedside. Disposition Plan: Home when ileus/ small bowel obstruction has resolved.   Consultants:  General surgery: Dr Ezzard Standing 01/07/15  Procedures:  Abdominal x-ray 01/06/2015  CT abdomen and pelvis 01/05/2015  Antibiotics:  IV Rocephin 01/05/2015 >>>>01/07/15  HPI/Subjective: Patient sleeping.   Objective: Filed Vitals:   01/08/15 0651  BP: 151/61   Pulse: 69  Temp: 97.5 F (36.4 C)  Resp: 20    Intake/Output Summary (Last 24 hours) at 01/08/15 1349 Last data filed at 01/08/15 1300  Gross per 24 hour  Intake 2131.25 ml  Output   1200 ml  Net 931.25 ml   Filed Weights   01/05/15 1744  Weight: 73.755 kg (162 lb 9.6 oz)    Exam:   General:  NAD  Cardiovascular: RRR  Respiratory: CTAB  Abdomen: Soft, nontender, distended in right lower quadrant, hypoactive bowel sounds, no rebound, no guarding  Musculoskeletal: No clubbing cyanosis or edema.  Data Reviewed: Basic Metabolic Panel:  Recent Labs Lab 01/05/15 1432 01/06/15 0510 01/06/15 1802 01/07/15 0529 01/08/15 0820  NA 139 137  --  137 135  K 3.5 3.3*  --  4.2 4.6  CL 103 104  --  106 107  CO2 25 24  --  21* 24  GLUCOSE 130* 118*  --  117* 114*  BUN 31* 23*  --  10 7  CREATININE 0.76 0.75  --  0.67 0.82  CALCIUM 8.9 7.7*  --  8.1* 7.9*  MG  --   --  1.7 1.7 2.2   Liver Function Tests:  Recent Labs Lab 01/05/15 1432  AST 28  ALT 22  ALKPHOS 57  BILITOT 0.6  PROT 6.8  ALBUMIN 3.3*   No results for input(s): LIPASE, AMYLASE in the last 168 hours. No results for input(s): AMMONIA in the last 168 hours. CBC:  Recent Labs Lab 01/05/15 1432 01/06/15 0510 01/07/15 0529 01/08/15 0820  WBC 11.3* 9.8 12.8* 8.7  NEUTROABS 7.6  --   --   --   HGB 13.3 11.8* 12.8 12.4  HCT 40.6 36.3 39.1 37.5  MCV 85.8 86.0 85.9 86.6  PLT 187 183 233 202   Cardiac Enzymes: No results for input(s): CKTOTAL, CKMB, CKMBINDEX,  TROPONINI in the last 168 hours. BNP (last 3 results) No results for input(s): BNP in the last 8760 hours.  ProBNP (last 3 results) No results for input(s): PROBNP in the last 8760 hours.  CBG: No results for input(s): GLUCAP in the last 168 hours.  Recent Results (from the past 240 hour(s))  Urine culture     Status: None   Collection Time: 01/05/15  2:27 PM  Result Value Ref Range Status   Specimen Description URINE, CLEAN CATCH   Final   Special Requests NONE  Final   Culture   Final    Multiple bacterial morphotypes present, none predominant. Suggest appropriate recollection if clinically indicated. Performed at [facility]    Report Status 01/07/2015 FINAL  Final     Studies: Dg Abd 2 Views  01/07/2015   CLINICAL DATA:  Distended abdomen.  EXAM: ABDOMEN - 2 VIEW  COMPARISON:  01/06/2015 and CT 01/05/2015  FINDINGS: Surgical clips over the right upper quadrant. Air is present throughout the colon. No dilated colonic loops. There is new air-filled prominent bowel loop in the right upper quadrant difficult to distinguish whether this is larger small-bowel but likely dilated small bowel. Overall improvement compared to the previous exam with less air-filled dilated small bowel loops. Several scattered air-fluid levels on the decubitus film. No evidence of free peritoneal air. Remainder of the exam is unchanged.  IMPRESSION: Interval improvement with less number of dilated air-filled small bowel loops. Less overall colonic distention. Findings may be due to improving ileus or obstruction.   Electronically Signed   By: Elberta Fortis M.D.   On: 01/07/2015 15:29    Scheduled Meds: . darifenacin  7.5 mg Oral Daily  . diltiazem  180 mg Oral Daily  . enoxaparin (LOVENOX) injection  40 mg Subcutaneous Q24H  . levothyroxine  56 mcg Intravenous Daily  . pantoprazole (PROTONIX) IV  40 mg Intravenous Q24H  . polyethylene glycol  17 g Oral Daily  . simethicone  80 mg Oral QID   Continuous Infusions: . sodium chloride 0.9 % 1,000 mL with potassium chloride 40 mEq infusion 75 mL/hr at 01/08/15 6761    Principal Problem:   SBO (small bowel obstruction) Active Problems:   Hypothyroidism   HYPOKALEMIA   Essential hypertension   UTI (lower urinary tract infection)   Acute encephalopathy   Ileus    Time spent: 35 mins    [facility] MD [facility] Pager [phone]. If 7PM-7AM, please contact  night-coverage at [email or web address], password [facility] 01/08/2015, 1:49 PM  LOS: 3 days

## 2015-01-08 NOTE — Progress Notes (Signed)
Patient ID: [name], female   DOB: 1916/08/07, 81 y.o.   MRN: [id number]    Subjective: Pt demented.  Unsure if she has had a BM but one is documented from yesterday.  No nausea.    Objective: Vital signs in last 24 hours: Temp:  [97.5 F (36.4 C)-98.1 F (36.7 C)] 97.5 F (36.4 C) (06/17 0651) Pulse Rate:  [69-95] 69 (06/17 0651) Resp:  [20-21] 20 (06/17 0651) BP: (151-162)/(61-76) 151/61 mmHg (06/17 0651) SpO2:  [94 %-95 %] 94 % (06/17 0651) Last BM Date: Jan 20, 2015  Intake/Output from previous day: 01-20-2023 0701 - 06/17 0700 In: 2371.3 [P.O.:480; I.V.:1841.3; IV Piggyback:50] Out: 1150 [Urine:1150] Intake/Output this shift:    PE: Abd: soft, but distended, specifically on the right side, suspect colon is what is palpable, +BS, NT  Lab Results:   Recent Labs  01/06/15 0510 01/20/15 0529  WBC 9.8 12.8*  HGB 11.8* 12.8  HCT 36.3 39.1  PLT 183 233   BMET  Recent Labs  01/06/15 0510 Jan 20, 2015 0529  NA 137 137  K 3.3* 4.2  CL 104 106  CO2 24 21*  GLUCOSE 118* 117*  BUN 23* 10  CREATININE 0.75 0.67  CALCIUM 7.7* 8.1*   PT/INR No results for input(s): LABPROT, INR in the last 72 hours. CMP     Component Value Date/Time   NA 137 20-Jan-2015 0529   K 4.2 January 20, 2015 0529   CL 106 01/20/15 0529   CO2 21* 2015-01-20 0529   GLUCOSE 117* 01-20-2015 0529   BUN 10 2015-01-20 0529   CREATININE 0.67 2015/01/20 0529   CALCIUM 8.1* 2015-01-20 0529   PROT 6.8 01/05/2015 1432   ALBUMIN 3.3* 01/05/2015 1432   AST 28 01/05/2015 1432   ALT 22 01/05/2015 1432   ALKPHOS 57 01/05/2015 1432   BILITOT 0.6 01/05/2015 1432   GFRNONAA >60 2015-01-20 0529   GFRAA >60 January 20, 2015 0529   Lipase  No results found for: LIPASE     Studies/Results: Dg Abd 2 Views  01/20/15   CLINICAL DATA:  Distended abdomen.  EXAM: ABDOMEN - 2 VIEW  COMPARISON:  01/06/2015 and CT 01/05/2015  FINDINGS: Surgical clips over the right upper quadrant. Air is present throughout the colon.  No dilated colonic loops. There is new air-filled prominent bowel loop in the right upper quadrant difficult to distinguish whether this is larger small-bowel but likely dilated small bowel. Overall improvement compared to the previous exam with less air-filled dilated small bowel loops. Several scattered air-fluid levels on the decubitus film. No evidence of free peritoneal air. Remainder of the exam is unchanged.  IMPRESSION: Interval improvement with less number of dilated air-filled small bowel loops. Less overall colonic distention. Findings may be due to improving ileus or obstruction.   Electronically Signed   By: Elberta Fortis M.D.   On: 2015-01-20 15:29   Dg Abd 2 Views  01/06/2015   CLINICAL DATA:  Small bowel obstruction.  EXAM: ABDOMEN - 2 VIEW  COMPARISON:  Abdominal CT from yesterday  FINDINGS: Evolution of the abnormal bowel gas pattern with now diffuse small and large bowel distention. A small bowel transition point was seen previously, presumably partial obstruction given today's finding. Intraluminal contrast is not well visualized; much of it has been absorbed and is now on the urinary bladder. No pneumoperitoneum.  IMPRESSION: 1. Evolution of bowel gas pattern to now include small and large bowel distention. Although a small bowel transition point was on the prior CT, the plain film  pattern suggests an adynamic ileus. 2. Oral contrast is not seen in the colon, but much of it has been absorbed and is now in the bladder.   Electronically Signed   By: Marnee Spring M.D.   On: 01/06/2015 10:35    Anti-infectives: Anti-infectives    Start     Dose/Rate Route Frequency Ordered Stop   01/06/15 2000  Levofloxacin (LEVAQUIN) IVPB 250 mg  Status:  Discontinued     250 mg 50 mL/hr over 60 Minutes Intravenous Every 24 hours 01/05/15 1907 01/05/15 1915   01/05/15 2000  levofloxacin (LEVAQUIN) IVPB 500 mg  Status:  Discontinued     500 mg 100 mL/hr over 60 Minutes Intravenous  Once 01/05/15  1808 01/05/15 1915   01/05/15 2000  cefTRIAXone (ROCEPHIN) 1 g in dextrose 5 % 50 mL IVPB - Premix     1 g 100 mL/hr over 30 Minutes Intravenous Every 24 hours 01/05/15 1915         Assessment/Plan  1. Adynamic ileus  -cont to mobilize as much as possible  -keep electrolytes replaced  -as long as she is having BMs and passing flatus without nausea, her diet can be advanced. -no surgical indications, we will follow.  If she develops a large colonic ileus, she eventually may need GI involvement, but do not think that is necessary right now.   LOS: 3 days    OSBORNE,KELLY E 01/08/2015, 8:03 AM Pager: [phone]  Agree with above.  Ovidio Kin, MD, [facility] Pager: [phone] Office phone:  [phone]

## 2015-01-08 NOTE — Progress Notes (Signed)
Physical Therapy Treatment Patient Details Name: [name] MRN: [id number] DOB: Dec 10, 1916 Today's Date: 01/08/2015    History of Present Illness Pt is 81 yo female with known dementia, presented to [facility] ED by family for evaluation of several days duration of progressively worsening confusion, poor oral intake, more lethargy over the past 24 hours. Please note that pt is unable to provide history due to dementia and family provides some information. Family explains pt has had some abd pain and was pointing mostly to lower abd quadrants. Family is unable to fully describe the pain but mentions noticing foul smelling urine, no report of fevers, chills, no reports of vomiting.    PT Comments    RN reports pt has been sleeping most of the day. Sitter in room.  Pt was difficult to arouse.  Very sleepy/groggy and unaware of her surrounding.  Assisted from supine to EOB with MAX VC's and increased time.  Once upright, pt able to support self.  Assisted from bed to [facility] to void, required + 2 assist.  Assisted with amb in hallway with RW decreased distance.   Very unsteady/groggy/shuffled gait.  HIGH FALL RISK.  Very different performance vs last PT note (meds?).   Follow Up Recommendations  No PT follow up (per LPT eval 12/23/14)     Equipment Recommendations  None recommended by PT    Recommendations for Other Services       Precautions / Restrictions Precautions Precautions: Fall Restrictions Weight Bearing Restrictions: No    Mobility  Bed Mobility Overal bed mobility: Needs Assistance Bed Mobility: Supine to Sit     Supine to sit: Mod assist;Max assist     General bed mobility comments: groggy, required increased time to arouse.  sitter in room reports pt has been sleeping most of day.   Transfers Overall transfer level: Needs assistance Equipment used: Rolling walker (2 wheeled) Transfers: Sit to/from Stand Sit to Stand: Mod assist;+2 safety/equipment;+2 physical  assistance         General transfer comment: max VC's to increase particiaption.  Assisted from bed to [facility] with 100% VC's on turn completion and direction.   Ambulation/Gait Ambulation/Gait assistance: +2 physical assistance;+2 safety/equipment;Mod assist Ambulation Distance (Feet): 18 Feet Assistive device: Rolling walker (2 wheeled) Gait Pattern/deviations: Step-to pattern;Step-through pattern;Trunk flexed;Shuffle;Decreased step length - right;Decreased step length - left Gait velocity: decreased   General Gait Details: very unsteady/groggy/sluggish gait.  Slow corrective reaction and shuffled feet. (meds?)   Stairs            Wheelchair Mobility    Modified Rankin (Stroke Patients Only)       Balance                                    Cognition Arousal/Alertness: Lethargic Behavior During Therapy: Flat affect Overall Cognitive Status: Impaired/Different from baseline (groggy/sleepy/difficult to maintain arousal) Area of Impairment: Orientation;Attention                    Exercises      General Comments        Pertinent Vitals/Pain Pain Assessment: No/denies pain    Home Living                      Prior Function            PT Goals (current goals can now be found in the care plan  section) Progress towards PT goals: Progressing toward goals    Frequency  Min 3X/week    PT Plan      Co-evaluation             End of Session Equipment Utilized During Treatment: Gait belt Activity Tolerance: Patient limited by fatigue Patient left: in chair;with call bell/phone within reach;with nursing/sitter in room     Time: 1405-1430 PT Time Calculation (min) (ACUTE ONLY): 25 min  Charges:  $Gait Training: 8-22 mins $Therapeutic Activity: 8-22 mins                    G Codes:      Felecia Shelling  PTA [facility]  Acute  Rehab Pager      [phone]

## 2015-01-09 DIAGNOSIS — K560 Paralytic ileus: Secondary | ICD-10-CM | POA: Insufficient documentation

## 2015-01-09 DIAGNOSIS — E876 Hypokalemia: Secondary | ICD-10-CM

## 2015-01-09 LAB — CBC
HEMATOCRIT: 39.9 % (ref 36.0–46.0)
HEMOGLOBIN: 13.1 g/dL (ref 12.0–15.0)
MCH: 28.7 pg (ref 26.0–34.0)
MCHC: 32.8 g/dL (ref 30.0–36.0)
MCV: 87.5 fL (ref 78.0–100.0)
Platelets: 275 10*3/uL (ref 150–400)
RBC: 4.56 MIL/uL (ref 3.87–5.11)
RDW: 13.9 % (ref 11.5–15.5)
WBC: 11.3 10*3/uL — ABNORMAL HIGH (ref 4.0–10.5)

## 2015-01-09 LAB — BASIC METABOLIC PANEL
ANION GAP: 9 (ref 5–15)
BUN: 8 mg/dL (ref 6–20)
CHLORIDE: 110 mmol/L (ref 101–111)
CO2: 19 mmol/L — ABNORMAL LOW (ref 22–32)
Calcium: 8.5 mg/dL — ABNORMAL LOW (ref 8.9–10.3)
Creatinine, Ser: 0.7 mg/dL (ref 0.44–1.00)
GFR calc non Af Amer: >60 mL/min (ref >60)
Glucose, Bld: 115 mg/dL — ABNORMAL HIGH (ref 65–99)
Potassium: 4.9 mmol/L (ref 3.5–5.1)
Sodium: 138 mmol/L (ref 135–145)

## 2015-01-09 MED ORDER — PANTOPRAZOLE SODIUM 40 MG PO TBEC
40.0000 mg | DELAYED_RELEASE_TABLET | Freq: Every day | ORAL | Status: DC
  Administered 2015-01-09 – 2015-01-11 (×3): 40 mg via ORAL
  Filled 2015-01-09 (×4): qty 1

## 2015-01-09 MED ORDER — LEVOTHYROXINE SODIUM 112 MCG PO TABS
112.0000 ug | ORAL_TABLET | Freq: Every day | ORAL | Status: DC
  Administered 2015-01-10 – 2015-01-14 (×4): 112 ug via ORAL
  Filled 2015-01-09 (×6): qty 1

## 2015-01-09 NOTE — Progress Notes (Signed)
Patient ID: [name], female   DOB: 12-Mar-1917, 81 y.o.   MRN: [id number]  General Surgery [facility]  Subjective: Patient up in bed, family at bedside.  Pleasant, confused.  No complaints of pain or nausea.  No BM's overnight.  Objective: Vital signs in last 24 hours: Temp:  [97.5 F (36.4 C)-97.8 F (36.6 C)] 97.5 F (36.4 C) (06/18 0649) Pulse Rate:  [70-74] 70 (06/18 0649) Resp:  [20-22] 22 (06/18 0649) BP: (116-172)/(66-76) 158/66 mmHg (06/18 1002) SpO2:  [94 %-97 %] 94 % (06/18 0649) Last BM Date: 01/15/2015  Intake/Output from previous day: 06/17 0701 - 06/18 0700 In: 1677.5 [I.V.:1677.5] Out: 200 [Urine:200] Intake/Output this shift:    Physical Exam: HEENT - sclerae clear, mucous membranes moist Neck - soft Chest - clear bilaterally Cor - RRR Abdomen - protuberant, tense; non-tender; BS present Ext - no edema, non-tender Neuro - alert & oriented, no focal deficits  Lab Results:   Recent Labs  01/08/15 0820 01/09/15 0523  WBC 8.7 11.3*  HGB 12.4 13.1  HCT 37.5 39.9  PLT 202 275   BMET  Recent Labs  01/08/15 0820 01/09/15 0523  NA 135 138  K 4.6 4.9  CL 107 110  CO2 24 19*  GLUCOSE 114* 115*  BUN 7 8  CREATININE 0.82 0.70  CALCIUM 7.9* 8.5*   PT/INR No results for input(s): LABPROT, INR in the last 72 hours. Comprehensive Metabolic Panel:    Component Value Date/Time   NA 138 01/09/2015 0523   NA 135 01/08/2015 0820   K 4.9 01/09/2015 0523   K 4.6 01/08/2015 0820   CL 110 01/09/2015 0523   CL 107 01/08/2015 0820   CO2 19* 01/09/2015 0523   CO2 24 01/08/2015 0820   BUN 8 01/09/2015 0523   BUN 7 01/08/2015 0820   CREATININE 0.70 01/09/2015 0523   CREATININE 0.82 01/08/2015 0820   GLUCOSE 115* 01/09/2015 0523   GLUCOSE 114* 01/08/2015 0820   CALCIUM 8.5* 01/09/2015 0523   CALCIUM 7.9* 01/08/2015 0820   AST 28 01/05/2015 1432   AST 83* 10/16/2013 1155   ALT 22 01/05/2015 1432   ALT 67* 10/16/2013 1155    ALKPHOS 57 01/05/2015 1432   ALKPHOS 120* 10/16/2013 1155   BILITOT 0.6 01/05/2015 1432   BILITOT 3.2* 10/16/2013 1155   PROT 6.8 01/05/2015 1432   PROT 5.2* 10/16/2013 1155   ALBUMIN 3.3* 01/05/2015 1432   ALBUMIN 2.3* 10/16/2013 1155    Studies/Results: Dg Abd 2 Views  01/15/15   CLINICAL DATA:  Distended abdomen.  EXAM: ABDOMEN - 2 VIEW  COMPARISON:  01/06/2015 and CT 01/05/2015  FINDINGS: Surgical clips over the right upper quadrant. Air is present throughout the colon. No dilated colonic loops. There is new air-filled prominent bowel loop in the right upper quadrant difficult to distinguish whether this is larger small-bowel but likely dilated small bowel. Overall improvement compared to the previous exam with less air-filled dilated small bowel loops. Several scattered air-fluid levels on the decubitus film. No evidence of free peritoneal air. Remainder of the exam is unchanged.  IMPRESSION: Interval improvement with less number of dilated air-filled small bowel loops. Less overall colonic distention. Findings may be due to improving ileus or obstruction.   Electronically Signed   By: Elberta Fortis M.D.   On: 01/15/2015 15:29    Anti-infectives: Anti-infectives    Start     Dose/Rate Route Frequency Ordered Stop   01/06/15 2000  Levofloxacin (  LEVAQUIN) IVPB 250 mg  Status:  Discontinued     250 mg 50 mL/hr over 60 Minutes Intravenous Every 24 hours 01/05/15 1907 01/05/15 1915   01/05/15 2000  levofloxacin (LEVAQUIN) IVPB 500 mg  Status:  Discontinued     500 mg 100 mL/hr over 60 Minutes Intravenous  Once 01/05/15 1808 01/05/15 1915   01/05/15 2000  cefTRIAXone (ROCEPHIN) 1 g in dextrose 5 % 50 mL IVPB - Premix  Status:  Discontinued     1 g 100 mL/hr over 30 Minutes Intravenous Every 24 hours 01/05/15 1915 01/08/15 1322      Assessment & Plans: Likely adynamic colonic ileus  Encourage OOB, ambulation  Will check AXR tomorrow AM  Monitor electrolytes  Per medical  service  Velora Heckler, MD, [facility] Office: [phone]   Joelle [place] Judie Petit 01/09/2015

## 2015-01-09 NOTE — Progress Notes (Signed)
[facility] PROGRESS NOTE  [name] DDU:[id number] DOB: 1917/07/24 DOA: 01/05/2015 PCP: [name], MD  Assessment/Plan: #1 small bowel obstruction vs adynamic ileus Patient with bowel movement 2 days ago.  Abdominal x-rays from 01/06/2015, with dilatation of small or large bowel concerning for possible adynamic ileus. Abdominal x-ray from 01/07/2015 with improvement with less number of dilated air-filled small bowel loops and less overall colonic distention. Patient tolerating  full liquids inue conservative measures. Replete electrolytes keep potassium greater than 4 and magnesium greater than 2. Family at this time wants conservative treatment. Continue  full liquid diet. Place on MiraLAX daily. Monitor clinically.   #2 hypokalemia Repleted.  #3 leukocytosis Likely reactive leukocytosis. WBC trending down.  #4 recent UTI Per family patient was started on ciprofloxacin for treatment for recent UTI. Patient has received approximately 7 days of antibiotics.  #5 gastroesophageal reflux disease PPI.  #6 hypothyroidism Change IV to oral Synthroid.   #7 acute encephalopathy Patient with history of dementia. Encephalopathy improving. D/C'd empiric IV antibiotics. Continue supportive care. Follow.  #8 dementia Stable.  #9 hypertension Stable. Continue diltiazem.   #9 prophylaxis PPI for GI prophylaxis. Lovenox for DVT prophylaxis.  Code Status: Full Family Communication: Updated patient, no family at bedside. Disposition Plan: Home with home health when ileus/ small bowel obstruction has resolved.   Consultants:  General surgery: Dr Ezzard Standing 01/07/15  Procedures:  Abdominal x-ray 01/06/2015  CT abdomen and pelvis 01/05/2015  Antibiotics:  IV Rocephin 01/05/2015 >>>>01/07/15  HPI/Subjective: Patient states felt weak when sitting up today. No nausea. No vomiting. Tolerating a full liquid diet.  Objective: Filed Vitals:   01/09/15 1425  BP: 149/60   Pulse: 64  Temp: 97.8 F (36.6 C)  Resp: 20    Intake/Output Summary (Last 24 hours) at 01/09/15 1504 Last data filed at 01/09/15 0455  Gross per 24 hour  Intake 1677.5 ml  Output      0 ml  Net 1677.5 ml   Filed Weights   01/05/15 1744  Weight: 73.755 kg (162 lb 9.6 oz)    Exam:   General:  NAD  Cardiovascular: RRR  Respiratory: CTAB  Abdomen: Soft, nontender, distended in right lower quadrant, + bowel sounds, no rebound, no guarding  Musculoskeletal: No clubbing cyanosis or edema.  Data Reviewed: Basic Metabolic Panel:  Recent Labs Lab 01/05/15 1432 01/06/15 0510 01/06/15 1802 01/07/15 0529 01/08/15 0820 01/09/15 0523  NA 139 137  --  137 135 138  K 3.5 3.3*  --  4.2 4.6 4.9  CL 103 104  --  106 107 110  CO2 25 24  --  21* 24 19*  GLUCOSE 130* 118*  --  117* 114* 115*  BUN 31* 23*  --  CREATININE 0.76 0.75  --  0.67 0.82 0.70  CALCIUM 8.9 7.7*  --  8.1* 7.9* 8.5*  MG  --   --  1.7 1.7 2.2  --    Liver Function Tests:  Recent Labs Lab 01/05/15 1432  AST 28  ALT 22  ALKPHOS 57  BILITOT 0.6  PROT 6.8  ALBUMIN 3.3*   No results for input(s): LIPASE, AMYLASE in the last 168 hours. No results for input(s): AMMONIA in the last 168 hours. CBC:  Recent Labs Lab 01/05/15 1432 01/06/15 0510 01/07/15 0529 01/08/15 0820 01/09/15 0523  WBC 11.3* 9.8 12.8* 8.7 11.3*  NEUTROABS 7.6  --   --   --   --   HGB 13.3 11.8*  12.8 12.4 13.1  HCT 40.6 36.3 39.1 37.5 39.9  MCV 85.8 86.0 85.9 86.6 87.5  PLT 187 183 233 202 275   Cardiac Enzymes: No results for input(s): CKTOTAL, CKMB, CKMBINDEX, TROPONINI in the last 168 hours. BNP (last 3 results) No results for input(s): BNP in the last 8760 hours.  ProBNP (last 3 results) No results for input(s): PROBNP in the last 8760 hours.  CBG: No results for input(s): GLUCAP in the last 168 hours.  Recent Results (from the past 240 hour(s))  Urine culture     Status: None   Collection Time:  01/05/15  2:27 PM  Result Value Ref Range Status   Specimen Description URINE, CLEAN CATCH  Final   Special Requests NONE  Final   Culture   Final    Multiple bacterial morphotypes present, none predominant. Suggest appropriate recollection if clinically indicated. Performed at [facility]    Report Status 01/07/2015 FINAL  Final     Studies: Dg Abd 2 Views  01/07/2015   CLINICAL DATA:  Distended abdomen.  EXAM: ABDOMEN - 2 VIEW  COMPARISON:  01/06/2015 and CT 01/05/2015  FINDINGS: Surgical clips over the right upper quadrant. Air is present throughout the colon. No dilated colonic loops. There is new air-filled prominent bowel loop in the right upper quadrant difficult to distinguish whether this is larger small-bowel but likely dilated small bowel. Overall improvement compared to the previous exam with less air-filled dilated small bowel loops. Several scattered air-fluid levels on the decubitus film. No evidence of free peritoneal air. Remainder of the exam is unchanged.  IMPRESSION: Interval improvement with less number of dilated air-filled small bowel loops. Less overall colonic distention. Findings may be due to improving ileus or obstruction.   Electronically Signed   By: Elberta Fortis M.D.   On: 01/07/2015 15:29    Scheduled Meds: . darifenacin  7.5 mg Oral Daily  . diltiazem  180 mg Oral Daily  . enoxaparin (LOVENOX) injection  40 mg Subcutaneous Q24H  . [START ON 01/10/2015] levothyroxine  112 mcg Oral QAC breakfast  . pantoprazole  40 mg Oral Daily  . polyethylene glycol  17 g Oral Daily   Continuous Infusions: . sodium chloride 0.9 % 1,000 mL with potassium chloride 40 mEq infusion 75 mL/hr at 01/08/15 5830    Principal Problem:   SBO (small bowel obstruction) Active Problems:   Hypothyroidism   HYPOKALEMIA   Essential hypertension   UTI (lower urinary tract infection)   Acute encephalopathy   Ileus    Time spent: 35 mins    [facility] MD [facility] Pager [phone]. If 7PM-7AM, please contact night-coverage at [email or web address], password [facility] 01/09/2015, 3:04 PM  LOS: 4 days

## 2015-01-10 ENCOUNTER — Inpatient Hospital Stay (HOSPITAL_COMMUNITY): Payer: Medicare Other

## 2015-01-10 LAB — BASIC METABOLIC PANEL
ANION GAP: 7 (ref 5–15)
BUN: 6 mg/dL (ref 6–20)
CHLORIDE: 109 mmol/L (ref 101–111)
CO2: 22 mmol/L (ref 22–32)
CREATININE: 0.67 mg/dL (ref 0.44–1.00)
Calcium: 8.2 mg/dL — ABNORMAL LOW (ref 8.9–10.3)
GFR calc non Af Amer: >60 mL/min (ref >60)
GLUCOSE: 107 mg/dL — AB (ref 65–99)
POTASSIUM: 4.4 mmol/L (ref 3.5–5.1)
SODIUM: 138 mmol/L (ref 135–145)

## 2015-01-10 NOTE — Progress Notes (Signed)
Patient ID: [name], female   DOB: Jan 02, 1917, 81 y.o.   MRN: [id number]  General Surgery [facility]  Subjective: Patient sleeping in bed, awakens easily.  "I feel better".  Denies abdominal pain.  Taking po liquids.  Objective: Vital signs in last 24 hours: Temp:  [97.8 F (36.6 C)-98.3 F (36.8 C)] 98.3 F (36.8 C) (06/19 0651) Pulse Rate:  [64-68] 67 (06/19 0651) Resp:  [18-20] 18 (06/19 0651) BP: (149-158)/(60-77) 156/77 mmHg (06/19 0651) SpO2:  [93 %-95 %] 95 % (06/19 0651) Last BM Date: 01/07/15  Intake/Output from previous day: 06/18 0701 - 06/19 0700 In: 2081.3 [I.V.:2081.3] Out: 900 [Urine:900] Intake/Output this shift:    Physical Exam: HEENT - sclerae clear, mucous membranes moist Neck - soft Chest - clear bilaterally Cor - RRR Abdomen - much softer, less distended; BS present; non-tender Ext - no edema, non-tender  Lab Results:   Recent Labs  01/08/15 0820 01/09/15 0523  WBC 8.7 11.3*  HGB 12.4 13.1  HCT 37.5 39.9  PLT 202 275   BMET  Recent Labs  01/09/15 0523 01/10/15 0531  NA 138 138  K 4.9 4.4  CL 110 109  CO2 19* 22  GLUCOSE 115* 107*  BUN 8 6  CREATININE 0.70 0.67  CALCIUM 8.5* 8.2*   PT/INR No results for input(s): LABPROT, INR in the last 72 hours. Comprehensive Metabolic Panel:    Component Value Date/Time   NA 138 01/10/2015 0531   NA 138 01/09/2015 0523   K 4.4 01/10/2015 0531   K 4.9 01/09/2015 0523   CL 109 01/10/2015 0531   CL 110 01/09/2015 0523   CO2 22 01/10/2015 0531   CO2 19* 01/09/2015 0523   BUN 6 01/10/2015 0531   BUN 8 01/09/2015 0523   CREATININE 0.67 01/10/2015 0531   CREATININE 0.70 01/09/2015 0523   GLUCOSE 107* 01/10/2015 0531   GLUCOSE 115* 01/09/2015 0523   CALCIUM 8.2* 01/10/2015 0531   CALCIUM 8.5* 01/09/2015 0523   AST 28 01/05/2015 1432   AST 83* 10/16/2013 1155   ALT 22 01/05/2015 1432   ALT 67* 10/16/2013 1155   ALKPHOS 57 01/05/2015 1432   ALKPHOS 120*  10/16/2013 1155   BILITOT 0.6 01/05/2015 1432   BILITOT 3.2* 10/16/2013 1155   PROT 6.8 01/05/2015 1432   PROT 5.2* 10/16/2013 1155   ALBUMIN 3.3* 01/05/2015 1432   ALBUMIN 2.3* 10/16/2013 1155    Studies/Results: No results found.  Anti-infectives: Anti-infectives    Start     Dose/Rate Route Frequency Ordered Stop   01/06/15 2000  Levofloxacin (LEVAQUIN) IVPB 250 mg  Status:  Discontinued     250 mg 50 mL/hr over 60 Minutes Intravenous Every 24 hours 01/05/15 1907 01/05/15 1915   01/05/15 2000  levofloxacin (LEVAQUIN) IVPB 500 mg  Status:  Discontinued     500 mg 100 mL/hr over 60 Minutes Intravenous  Once 01/05/15 1808 01/05/15 1915   01/05/15 2000  cefTRIAXone (ROCEPHIN) 1 g in dextrose 5 % 50 mL IVPB - Premix  Status:  Discontinued     1 g 100 mL/hr over 30 Minutes Intravenous Every 24 hours 01/05/15 1915 01/08/15 1322      Assessment & Plans: Adynamic colonic ileus Encourage OOB, ambulation Check AXR this AM Monitor electrolytes Per medical service  Velora Heckler, MD, [facility] Office: [phone]   Lennart Gladish Judie Petit 01/10/2015

## 2015-01-10 NOTE — Progress Notes (Signed)
[facility] PROGRESS NOTE  [name] UJW:[id number] DOB: [birth year] DOA: 01/05/2015 PCP: [name], MD  Assessment/Plan: #1 small bowel obstruction vs adynamic ileus Patient with bowel movement 2 days ago.  Abdominal x-rays from 01/06/2015, with dilatation of small or large bowel concerning for possible adynamic ileus. Abdominal x-ray from 01/07/2015 with improvement with less number of dilated air-filled small bowel loops and less overall colonic distention. Patient tolerating  full liquids inue conservative measures. Replete electrolytes keep potassium greater than 4 and magnesium greater than 2. Family at this time wants conservative treatment. Continue  full liquid diet. Continue MiraLAX daily. Monitor clinically.   #2 hypokalemia Repleted.  #3 leukocytosis Likely reactive leukocytosis. WBC trending down.  #4 recent UTI Per family patient was started on ciprofloxacin for treatment for recent UTI. Patient has received approximately 7 days of antibiotics.  #5 gastroesophageal reflux disease PPI.  #6 hypothyroidism Continue Synthroid.   #7 acute encephalopathy Patient with history of dementia. Encephalopathy improving. D/C'd empiric IV antibiotics. Continue supportive care. Follow.  #8 dementia Stable.  #9 hypertension Stable. Continue diltiazem.   #9 prophylaxis PPI for GI prophylaxis. Lovenox for DVT prophylaxis.  Code Status: Full Family Communication: Updated patient, no family at bedside. Disposition Plan: Home with home health when ileus/ small bowel obstruction has resolved.   Consultants:  General surgery: Dr Ezzard Standing 01/07/15  Procedures:  Abdominal x-ray 01/06/2015  CT abdomen and pelvis 01/05/2015  Antibiotics:  IV Rocephin 01/05/2015 >>>>01/07/15  HPI/Subjective: Patient states  She's feeling better. No nausea. No vomiting.patient with positive flatus. Eating lunch.  Tolerating a full liquid diet.  Objective: Filed Vitals:   01/10/15 0651  BP: 156/77  Pulse: 67  Temp: 98.3 F (36.8 C)  Resp: 18    Intake/Output Summary (Last 24 hours) at 01/10/15 1409 Last data filed at 01/10/15 5621  Gross per 24 hour  Intake 2081.25 ml  Output   1200 ml  Net 881.25 ml   Filed Weights   01/05/15 1744  Weight: 73.755 kg (162 lb 9.6 oz)    Exam:   General:  NAD  Cardiovascular: RRR  Respiratory: CTAB  Abdomen: Soft, nontender, + bowel sounds, no rebound, no guarding  Musculoskeletal: No clubbing cyanosis or edema.  Data Reviewed: Basic Metabolic Panel:  Recent Labs Lab 01/06/15 0510 01/06/15 1802 01/07/15 0529 01/08/15 0820 01/09/15 0523 01/10/15 0531  NA 137  --  137 135 138 138  K 3.3*  --  4.2 4.6 4.9 4.4  CL 104  --  106 107 110 109  CO2 24  --  21* 24 19* 22  GLUCOSE 118*  --  117* 114* 115* 107*  BUN 23*  --  CREATININE 0.75  --  0.67 0.82 0.70 0.67  CALCIUM 7.7*  --  8.1* 7.9* 8.5* 8.2*  MG  --  1.7 1.7 2.2  --   --    Liver Function Tests:  Recent Labs Lab 01/05/15 1432  AST 28  ALT 22  ALKPHOS 57  BILITOT 0.6  PROT 6.8  ALBUMIN 3.3*   No results for input(s): LIPASE, AMYLASE in the last 168 hours. No results for input(s): AMMONIA in the last 168 hours. CBC:  Recent Labs Lab 01/05/15 1432 01/06/15 0510 01/07/15 0529 01/08/15 0820 01/09/15 0523  WBC 11.3* 9.8 12.8* 8.7 11.3*  NEUTROABS 7.6  --   --   --   --   HGB 13.3 11.8* 12.8 12.4 13.1  HCT 40.6 36.3 39.1 37.5  39.9  MCV 85.8 86.0 85.9 86.6 87.5  PLT 187 183 233 202 275   Cardiac Enzymes: No results for input(s): CKTOTAL, CKMB, CKMBINDEX, TROPONINI in the last 168 hours. BNP (last 3 results) No results for input(s): BNP in the last 8760 hours.  ProBNP (last 3 results) No results for input(s): PROBNP in the last 8760 hours.  CBG: No results for input(s): GLUCAP in the last 168 hours.  Recent Results (from the past 240 hour(s))  Urine culture     Status: None   Collection Time: 01/05/15   2:27 PM  Result Value Ref Range Status   Specimen Description URINE, CLEAN CATCH  Final   Special Requests NONE  Final   Culture   Final    Multiple bacterial morphotypes present, none predominant. Suggest appropriate recollection if clinically indicated. Performed at [facility]    Report Status 01/07/2015 FINAL  Final     Studies: Dg Abd 2 Views  01/10/2015   CLINICAL DATA:  Lower abdominal pain. Lethargy. Follow-up ileus or obstruction.  EXAM: ABDOMEN - 2 VIEW  COMPARISON:  Previous examinations, the most recent dated 01/07/2015.  FINDINGS: No significant overall change in degree of small bowel dilatation with multiple air-fluid levels. Gas and stool in normal caliber colon and rectum. No free peritoneal air. Cholecystectomy clips. Left hip prosthesis. Lumbar and lower thoracic spine degenerative changes.  IMPRESSION: Stable pattern of probable partial small bowel obstruction.   Electronically Signed   By: Beckie Salts M.D.   On: 01/10/2015 10:57    Scheduled Meds: . darifenacin  7.5 mg Oral Daily  . diltiazem  180 mg Oral Daily  . enoxaparin (LOVENOX) injection  40 mg Subcutaneous Q24H  . levothyroxine  112 mcg Oral QAC breakfast  . pantoprazole  40 mg Oral Daily  . polyethylene glycol  17 g Oral Daily   Continuous Infusions: . sodium chloride 0.9 % 1,000 mL with potassium chloride 40 mEq infusion 75 mL/hr at 01/08/15 6147    Principal Problem:   SBO (small bowel obstruction) Active Problems:   Hypothyroidism   HYPOKALEMIA   Essential hypertension   UTI (lower urinary tract infection)   Acute encephalopathy   Ileus   Adynamic ileus    Time spent: 35 mins    [facility] MD [facility] Pager [phone]. If 7PM-7AM, please contact night-coverage at [email or web address], password [facility] 01/10/2015, 2:09 PM  LOS: 5 days

## 2015-01-11 DIAGNOSIS — K56600 Partial intestinal obstruction, unspecified as to cause: Secondary | ICD-10-CM | POA: Diagnosis present

## 2015-01-11 LAB — BASIC METABOLIC PANEL
Anion gap: 9 (ref 5–15)
BUN: 6 mg/dL (ref 6–20)
CALCIUM: 8.6 mg/dL — AB (ref 8.9–10.3)
CHLORIDE: 108 mmol/L (ref 101–111)
CO2: 22 mmol/L (ref 22–32)
Creatinine, Ser: 0.64 mg/dL (ref 0.44–1.00)
GFR calc non Af Amer: >60 mL/min (ref >60)
Glucose, Bld: 131 mg/dL — ABNORMAL HIGH (ref 65–99)
Potassium: 4.1 mmol/L (ref 3.5–5.1)
Sodium: 139 mmol/L (ref 135–145)

## 2015-01-11 LAB — CBC
HCT: 40.7 % (ref 36.0–46.0)
HEMOGLOBIN: 13.2 g/dL (ref 12.0–15.0)
MCH: 28.4 pg (ref 26.0–34.0)
MCHC: 32.4 g/dL (ref 30.0–36.0)
MCV: 87.5 fL (ref 78.0–100.0)
PLATELETS: 284 10*3/uL (ref 150–400)
RBC: 4.65 MIL/uL (ref 3.87–5.11)
RDW: 14 % (ref 11.5–15.5)
WBC: 6 10*3/uL (ref 4.0–10.5)

## 2015-01-11 LAB — MAGNESIUM: MAGNESIUM: 1.9 mg/dL (ref 1.7–2.4)

## 2015-01-11 LAB — CLOSTRIDIUM DIFFICILE BY PCR: CDIFFPCR: POSITIVE — AB

## 2015-01-11 MED ORDER — METRONIDAZOLE 500 MG PO TABS
500.0000 mg | ORAL_TABLET | Freq: Three times a day (TID) | ORAL | Status: DC
  Administered 2015-01-11: 500 mg via ORAL
  Filled 2015-01-11 (×5): qty 1

## 2015-01-11 MED ORDER — SACCHAROMYCES BOULARDII 250 MG PO CAPS
250.0000 mg | ORAL_CAPSULE | Freq: Two times a day (BID) | ORAL | Status: DC
  Administered 2015-01-11 – 2015-01-14 (×6): 250 mg via ORAL
  Filled 2015-01-11 (×7): qty 1

## 2015-01-11 NOTE — Progress Notes (Signed)
Patient ID: [name], female   DOB: [birth year], 81 y.o.   MRN: [id number]    Subjective: Pt looks well today.  Tolerating full liquids with no issues.  Passing flatus.  No nausea  Objective: Vital signs in last 24 hours: Temp:  [97.5 F (36.4 C)-98.6 F (37 C)] 97.5 F (36.4 C) (06/20 0527) Pulse Rate:  [64-88] 67 (06/20 0527) Resp:  [18] 18 (06/20 0527) BP: (126-165)/(63-129) 165/68 mmHg (06/20 0527) SpO2:  [97 %-98 %] 98 % (06/20 0527) Last BM Date: 02/01/15  Intake/Output from previous day: 02/01/23 0701 - 06/20 0700 In: 2040 [P.O.:240; I.V.:1800] Out: 1000 [Urine:1000] Intake/Output this shift:    PE: Abd: soft, less distended, +BS, NT  Lab Results:   Recent Labs  01/09/15 0523  WBC 11.3*  HGB 13.1  HCT 39.9  PLT 275   BMET  Recent Labs  01/09/15 0523 01-Feb-2015 0531  NA 138 138  K 4.9 4.4  CL 110 109  CO2 19* 22  GLUCOSE 115* 107*  BUN 8 6  CREATININE 0.70 0.67  CALCIUM 8.5* 8.2*   PT/INR No results for input(s): LABPROT, INR in the last 72 hours. CMP     Component Value Date/Time   NA 138 2015-02-01 0531   K 4.4 February 01, 2015 0531   CL 109 02/01/2015 0531   CO2 22 2015/02/01 0531   GLUCOSE 107* 2015/02/01 0531   BUN 6 02-01-15 0531   CREATININE 0.67 02-01-2015 0531   CALCIUM 8.2* Feb 01, 2015 0531   PROT 6.8 01/05/2015 1432   ALBUMIN 3.3* 01/05/2015 1432   AST 28 01/05/2015 1432   ALT 22 01/05/2015 1432   ALKPHOS 57 01/05/2015 1432   BILITOT 0.6 01/05/2015 1432   GFRNONAA >60 Feb 01, 2015 0531   GFRAA >60 02-01-15 0531   Lipase  No results found for: LIPASE     Studies/Results: Dg Abd 2 Views  02-01-2015   CLINICAL DATA:  Lower abdominal pain. Lethargy. Follow-up ileus or obstruction.  EXAM: ABDOMEN - 2 VIEW  COMPARISON:  Previous examinations, the most recent dated 01/07/2015.  FINDINGS: No significant overall change in degree of small bowel dilatation with multiple air-fluid levels. Gas and stool in normal caliber colon and  rectum. No free peritoneal air. Cholecystectomy clips. Left hip prosthesis. Lumbar and lower thoracic spine degenerative changes.  IMPRESSION: Stable pattern of probable partial small bowel obstruction.   Electronically Signed   By: Beckie Salts M.D.   On: 02/01/2015 10:57    Anti-infectives: Anti-infectives    Start     Dose/Rate Route Frequency Ordered Stop   01/06/15 2000  Levofloxacin (LEVAQUIN) IVPB 250 mg  Status:  Discontinued     250 mg 50 mL/hr over 60 Minutes Intravenous Every 24 hours 01/05/15 1907 01/05/15 1915   01/05/15 2000  levofloxacin (LEVAQUIN) IVPB 500 mg  Status:  Discontinued     500 mg 100 mL/hr over 60 Minutes Intravenous  Once 01/05/15 1808 01/05/15 1915   01/05/15 2000  cefTRIAXone (ROCEPHIN) 1 g in dextrose 5 % 50 mL IVPB - Premix  Status:  Discontinued     1 g 100 mL/hr over 30 Minutes Intravenous Every 24 hours 01/05/15 1915 01/08/15 1322       Assessment/Plan  1. Adynamic colonic ileus -tolerating full liquids.  Doesn't want solids because meats and things like that hurt her teeth. -patient is surgically stable.  No surgical intervention.  We will sign off.     LOS: 6 days    Lashara Urey E 01/11/2015,  9:08 AM Pager: [phone]

## 2015-01-11 NOTE — Progress Notes (Addendum)
CRITICAL VALUE ALERT  Critical value received:  c-diff positive  Date of notification:  01-11-15  Time of notification:  1845  Critical value read back:yes  Nurse who received alert:  Silvano Bilis  MD notified (1st page):  Dr. Veverly Fells, NP  Time of first page:  1845 & 1939  MD notified (2nd page):  Time of second page:  Responding MD:  Craige Cotta, NP  Time MD responded:  [phone]

## 2015-01-11 NOTE — Progress Notes (Signed)
[facility] PROGRESS NOTE  [name] ZOX:[id number] DOB: [birth year] DOA: 01/05/2015 PCP: [name], MD  Assessment/Plan: #1 small bowel obstruction vs adynamic ileus Patient with bowel movement today per patient.  Abdominal x-rays from 01/06/2015, with dilatation of small or large bowel concerning for possible adynamic ileus. Abdominal x-ray from 01/07/2015 with improvement with less number of dilated air-filled small bowel loops and less overall colonic distention. Patient tolerating  full liquids, continue conservative measures. Replete electrolytes keep potassium greater than 4 and magnesium greater than 2. Family at this time wants conservative treatment. Will advance diet to a soft diet. Continue MiraLAX daily. Monitor clinically.   #2 hypokalemia Repleted.  #3 leukocytosis Likely reactive leukocytosis. WBC trending down.  #4 recent UTI Per family patient was started on ciprofloxacin for treatment for recent UTI. Patient has received approximately 7 days of antibiotics.  #5 gastroesophageal reflux disease PPI.  #6 hypothyroidism Continue Synthroid.   #7 acute encephalopathy Patient with history of dementia. Encephalopathy improving. D/C'd empiric IV antibiotics. Continue supportive care. Follow.  #8 dementia Stable.  #9 hypertension Stable. Continue diltiazem.   #9 prophylaxis PPI for GI prophylaxis. Lovenox for DVT prophylaxis.  Code Status: Full Family Communication: Updated patient, no family at bedside. Disposition Plan: Home with home health when ileus/ small bowel obstruction has resolved, hopefully tomorrow.   Consultants:  General surgery: Dr Ezzard Standing 01/07/15  Procedures:  Abdominal x-ray 01/06/2015  CT abdomen and pelvis 01/05/2015  Antibiotics:  IV Rocephin 01/05/2015 >>>>01/07/15  HPI/Subjective: Patient sitting in chair. Patient with no complaints. Patient states she is passing gas. Patient states she had bowel movement.  Patient tolerating full liquids.  Objective: Filed Vitals:   01/11/15 0527  BP: 165/68  Pulse: 67  Temp: 97.5 F (36.4 C)  Resp: 18    Intake/Output Summary (Last 24 hours) at 01/11/15 1412 Last data filed at 01/11/15 0900  Gross per 24 hour  Intake   2160 ml  Output    700 ml  Net   1460 ml   Filed Weights   01/05/15 1744  Weight: 73.755 kg (162 lb 9.6 oz)    Exam:   General:  NAD  Cardiovascular: RRR  Respiratory: CTAB  Abdomen: Soft, nontender, + bowel sounds, no rebound, no guarding  Musculoskeletal: No clubbing cyanosis or edema.  Data Reviewed: Basic Metabolic Panel:  Recent Labs Lab 01/06/15 1802 01/07/15 0529 01/08/15 0820 01/09/15 0523 01/10/15 0531 01/11/15 0900  NA  --  137 135 138 138 139  K  --  4.2 4.6 4.9 4.4 4.1  CL  --  106 107 110 109 108  CO2  --  21* 24 19* 22 22  GLUCOSE  --  117* 114* 115* 107* 131*  BUN  --  CREATININE  --  0.67 0.82 0.70 0.67 0.64  CALCIUM  --  8.1* 7.9* 8.5* 8.2* 8.6*  MG 1.7 1.7 2.2  --   --  1.9   Liver Function Tests:  Recent Labs Lab 01/05/15 1432  AST 28  ALT 22  ALKPHOS 57  BILITOT 0.6  PROT 6.8  ALBUMIN 3.3*   No results for input(s): LIPASE, AMYLASE in the last 168 hours. No results for input(s): AMMONIA in the last 168 hours. CBC:  Recent Labs Lab 01/05/15 1432 01/06/15 0510 01/07/15 0529 01/08/15 0820 01/09/15 0523 01/11/15 0900  WBC 11.3* 9.8 12.8* 8.7 11.3* 6.0  NEUTROABS 7.6  --   --   --   --   --  HGB 13.3 11.8* 12.8 12.4 13.1 13.2  HCT 40.6 36.3 39.1 37.5 39.9 40.7  MCV 85.8 86.0 85.9 86.6 87.5 87.5  PLT 187 183 233 202 275 284   Cardiac Enzymes: No results for input(s): CKTOTAL, CKMB, CKMBINDEX, TROPONINI in the last 168 hours. BNP (last 3 results) No results for input(s): BNP in the last 8760 hours.  ProBNP (last 3 results) No results for input(s): PROBNP in the last 8760 hours.  CBG: No results for input(s): GLUCAP in the last 168 hours.  Recent  Results (from the past 240 hour(s))  Urine culture     Status: None   Collection Time: 01/05/15  2:27 PM  Result Value Ref Range Status   Specimen Description URINE, CLEAN CATCH  Final   Special Requests NONE  Final   Culture   Final    Multiple bacterial morphotypes present, none predominant. Suggest appropriate recollection if clinically indicated. Performed at [facility]    Report Status 01/07/2015 FINAL  Final     Studies: Dg Abd 2 Views  01/10/2015   CLINICAL DATA:  Lower abdominal pain. Lethargy. Follow-up ileus or obstruction.  EXAM: ABDOMEN - 2 VIEW  COMPARISON:  Previous examinations, the most recent dated 01/07/2015.  FINDINGS: No significant overall change in degree of small bowel dilatation with multiple air-fluid levels. Gas and stool in normal caliber colon and rectum. No free peritoneal air. Cholecystectomy clips. Left hip prosthesis. Lumbar and lower thoracic spine degenerative changes.  IMPRESSION: Stable pattern of probable partial small bowel obstruction.   Electronically Signed   By: Beckie Salts M.D.   On: 01/10/2015 10:57    Scheduled Meds: . darifenacin  7.5 mg Oral Daily  . diltiazem  180 mg Oral Daily  . enoxaparin (LOVENOX) injection  40 mg Subcutaneous Q24H  . levothyroxine  112 mcg Oral QAC breakfast  . pantoprazole  40 mg Oral Daily  . polyethylene glycol  17 g Oral Daily   Continuous Infusions: . sodium chloride 0.9 % 1,000 mL with potassium chloride 40 mEq infusion 75 mL/hr at 01/11/15 0800    Principal Problem:   SBO (small bowel obstruction) Active Problems:   Hypothyroidism   HYPOKALEMIA   Essential hypertension   UTI (lower urinary tract infection)   Acute encephalopathy   Ileus   Adynamic ileus    Time spent: 35 mins    [facility] MD [facility] Pager [phone]. If 7PM-7AM, please contact night-coverage at [email or web address], password [facility] 01/11/2015, 2:12 PM  LOS: 6 days

## 2015-01-11 NOTE — Care Management Note (Addendum)
Case Management Note  Patient Details  Name: [name] MRN: [id number] Date of Birth: 11/19/1916  Subjective/Objective:      81 yo female admitted with colonic ileus              Action/Plan: Family decided that they prefer the patient return to rehab at [facility] prior to returning home. CSW working with family for SNF placement.  Spoke with patient regarding HH services and the patient thinks she had [facility] in the past but isn't sure. With permission contacted the patient DIL, [name], and she stated that the patient has had therapy with [facility] in the past and in addition to OT and PT she requested [facility] RN for med management and disease process follow up. [name] stated that the patient has a CNA during the day and her grandson stays with her and [name] states she lives [place] store. No DME needs. [name] stated that when the patient is ready to go home the family will take the patient home and have no transportation needs. Contacted [name] with [facility] and communicated [facility] referral. Expected Discharge Date:   (unknown)               Expected Discharge Plan:  Home w Home Health Services  In-House Referral:  Clinical Social Work  Discharge planning Services  CM Consult  Post Acute Care Choice:    Choice offered to:  Patient, Adult Children  DME Arranged:    DME Agency:     HH Arranged:  RN, PT, OT HH Agency:  [facility]  Status of Service:  In process, will continue to follow  Medicare Important Message Given:  Yes Date Medicare IM Given:  01/08/15 Medicare IM give by:  [name] Date Additional Medicare IM Given:  01/11/15 Additional Medicare Important Message give by:  [name]  If discussed at Long Length of Stay Meetings, dates discussed:    Additional Comments:  Cindy Halls, RN 01/11/2015, 3:03 PM

## 2015-01-11 NOTE — Progress Notes (Signed)
Occupational Therapy Treatment Patient Details Name: [name] MRN: [id number] DOB: May 17, 1917 Today's Date: 01/11/2015    History of present illness Pt is 81 yo female with known dementia, presented to [facility] ED by family for evaluation of several days duration of progressively worsening confusion, poor oral intake, more lethargy over the past 24 hours. Please note that pt is unable to provide history due to dementia and family provides some information. Family explains pt has had some abd pain and was pointing mostly to lower abd quadrants. Family is unable to fully describe the pain but mentions noticing foul smelling urine, no report of fevers, chills, no reports of vomiting.   OT comments  Pt will need 24 / 7 A for safety  Follow Up Recommendations  Home health OT;Supervision/Assistance - 24 hour    Equipment Recommendations  Other (comment) (RW- pt states she doesnt need one)    Recommendations for Other Services      Precautions / Restrictions Precautions Precautions: Fall Restrictions Weight Bearing Restrictions: No       Mobility Bed Mobility   Bed Mobility: Supine to Sit     Supine to sit: Min assist        Transfers Overall transfer level: Needs assistance Equipment used: 1 person hand held assist Transfers: Sit to/from [employer] Sit to Stand: Mod assist Stand pivot transfers: Mod assist       General transfer comment: VC for safety and hand placement        ADL Overall ADL's : Needs assistance/impaired Eating/Feeding: Set up;Sitting   Grooming: Sitting;Wash/dry face;Set up                   Toilet Transfer: Moderate assistance;BSC;Stand-pivot   Toileting- Clothing Manipulation and Hygiene: Maximal assistance;Sit to/from stand                          Cognition   Behavior During Therapy: [facility] for tasks assessed/performed Overall Cognitive Status: Within Functional Limits for tasks assessed                   General Comments: pt knew day this day    Extremity/Trunk Assessment                          Pertinent Vitals/ Pain       Pain Assessment: No/denies pain     Prior Functioning/Environment              Frequency Min 2X/week     Progress Toward Goals  OT Goals(current goals can now be found in the care plan section)  Progress towards OT goals: Progressing toward goals     Plan Discharge plan remains appropriate       End of Session Equipment Utilized During Treatment: Rolling walker   Activity Tolerance Patient tolerated treatment well   Patient Left   In  Chair with chair alarm and call bell          Time: [phone] OT Time Calculation (min): 25 min  Charges: OT General Charges $OT Visit: 1 Procedure OT Treatments $Self Care/Home Management : 23-37 mins  [name], [name] 01/11/2015, 9:55 AM

## 2015-01-12 ENCOUNTER — Inpatient Hospital Stay (HOSPITAL_COMMUNITY): Payer: Medicare Other

## 2015-01-12 DIAGNOSIS — A0472 Enterocolitis due to Clostridium difficile, not specified as recurrent: Secondary | ICD-10-CM | POA: Clinically undetermined

## 2015-01-12 LAB — CBC WITH DIFFERENTIAL/PLATELET
BASOS ABS: 0.1 10*3/uL (ref 0.0–0.1)
Basophils Relative: 1 % (ref 0–1)
EOS ABS: 0.1 10*3/uL (ref 0.0–0.7)
Eosinophils Relative: 1 % (ref 0–5)
HCT: 40 % (ref 36.0–46.0)
HEMOGLOBIN: 13.1 g/dL (ref 12.0–15.0)
Lymphocytes Relative: 23 % (ref 12–46)
Lymphs Abs: 1.6 10*3/uL (ref 0.7–4.0)
MCH: 28.7 pg (ref 26.0–34.0)
MCHC: 32.8 g/dL (ref 30.0–36.0)
MCV: 87.5 fL (ref 78.0–100.0)
MONOS PCT: 9 % (ref 3–12)
Monocytes Absolute: 0.6 10*3/uL (ref 0.1–1.0)
NEUTROS PCT: 66 % (ref 43–77)
Neutro Abs: 4.5 10*3/uL (ref 1.7–7.7)
PLATELETS: 291 10*3/uL (ref 150–400)
RBC: 4.57 MIL/uL (ref 3.87–5.11)
RDW: 14.2 % (ref 11.5–15.5)
WBC: 6.9 10*3/uL (ref 4.0–10.5)

## 2015-01-12 LAB — BASIC METABOLIC PANEL
Anion gap: 9 (ref 5–15)
BUN: 6 mg/dL (ref 6–20)
CO2: 23 mmol/L (ref 22–32)
Calcium: 8.4 mg/dL — ABNORMAL LOW (ref 8.9–10.3)
Chloride: 107 mmol/L (ref 101–111)
Creatinine, Ser: 0.7 mg/dL (ref 0.44–1.00)
GFR calc non Af Amer: >60 mL/min (ref >60)
Glucose, Bld: 115 mg/dL — ABNORMAL HIGH (ref 65–99)
POTASSIUM: 4.5 mmol/L (ref 3.5–5.1)
Sodium: 139 mmol/L (ref 135–145)

## 2015-01-12 LAB — MAGNESIUM: Magnesium: 1.9 mg/dL (ref 1.7–2.4)

## 2015-01-12 MED ORDER — METRONIDAZOLE IN NACL 5-0.79 MG/ML-% IV SOLN
500.0000 mg | Freq: Three times a day (TID) | INTRAVENOUS | Status: DC
  Administered 2015-01-12 – 2015-01-14 (×7): 500 mg via INTRAVENOUS
  Filled 2015-01-12 (×8): qty 100

## 2015-01-12 MED ORDER — FAMOTIDINE 40 MG PO TABS
40.0000 mg | ORAL_TABLET | Freq: Every day | ORAL | Status: DC
  Filled 2015-01-12: qty 1

## 2015-01-12 MED ORDER — FAMOTIDINE IN NACL 20-0.9 MG/50ML-% IV SOLN
20.0000 mg | INTRAVENOUS | Status: DC
  Administered 2015-01-12 – 2015-01-13 (×2): 20 mg via INTRAVENOUS
  Filled 2015-01-12 (×3): qty 50

## 2015-01-12 MED ORDER — SODIUM CHLORIDE 0.9 % IR SOLN
500.0000 mg | Freq: Four times a day (QID) | Status: DC
  Administered 2015-01-12 – 2015-01-14 (×7): 500 mg via RECTAL
  Filled 2015-01-12 (×15): qty 500

## 2015-01-12 MED ORDER — VANCOMYCIN 50 MG/ML ORAL SOLUTION
500.0000 mg | Freq: Four times a day (QID) | ORAL | Status: DC
  Administered 2015-01-12 – 2015-01-14 (×9): 500 mg via ORAL
  Filled 2015-01-12 (×13): qty 10

## 2015-01-12 NOTE — Clinical Social Work Placement (Signed)
   CLINICAL SOCIAL WORK PLACEMENT  NOTE  Date:  01/12/2015  Patient Details  Name: [name] MRN: [id number] Date of Birth: 1917/02/01  Clinical Social Work is seeking post-discharge placement for this patient at the Skilled  Nursing Facility level of care (*CSW will initial, date and re-position this form in  chart as items are completed):  Yes   Patient/family provided with [place] Clinical Social Work Department's list of facilities offering this level of care within the geographic area requested by the patient (or if unable, by the patient's family).  Yes   Patient/family informed of their freedom to choose among providers that offer the needed level of care, that participate in Medicare, Medicaid or managed care program needed by the patient, have an available bed and are willing to accept the patient.  Yes   Patient/family informed of [facility]'s ownership interest in [facility] and [facility], as well as of the fact that they are under no obligation to receive care at these facilities.  PASRR submitted to EDS on       PASRR number received on       Existing PASRR number confirmed on 01/12/15     FL2 transmitted to all facilities in geographic area requested by pt/family on 01/12/15     FL2 transmitted to all facilities within larger geographic area on       Patient informed that his/her managed care company has contracts with or will negotiate with certain facilities, including the following:            Patient/family informed of bed offers received.  Patient chooses bed at       Physician recommends and patient chooses bed at      Patient to be transferred to   on  .  Patient to be transferred to facility by       Patient family notified on   of transfer.  Name of family member notified:        PHYSICIAN       Additional Comment:    _______________________________________________ Marnee Spring, LCSW 01/12/2015, 11:04 AM

## 2015-01-12 NOTE — Progress Notes (Addendum)
Clinical Social Work  Per progression meeting, patient was planning to DC home with family but family discussed plans and are preferring SNF placement at this time. CSW went to room to meet with patient but patient not completely oriented and no family present. CSW spoke with Bonita Quin (dtr-in-law) and grandson Onalee Hua) who reports that Onalee Hua is HCPOA. CSW encouraged family to bring paperwork stating HCPOA. Dtr-in-law and grandson both agree that patient would benefit from SNF placement. Patient stayed at [facility] in the past and received therapy prior to returning home. Family is hopeful for ST stay and then for patient to return home with 24/7 caregivers.  CSW left SNF list in room and explained SNF process. Family only requesting [facility] and has already spoken to admissions re: patient returning. CSW faxed information to SNF and left a message to determine if they could accept patient. Family has CSW contact information if further questions arise.  CSW will continue to follow.  [place], [place] [phone]  Addendum [phone] [facility] called and reports they can accept patient when medically stable.

## 2015-01-12 NOTE — Progress Notes (Signed)
[facility] PROGRESS NOTE  [name] ZOX:[id number] DOB: January 01, 1917 DOA: 01/05/2015 PCP: [name], MD  Assessment/Plan: #1 small bowel obstruction vs adynamic ileus Patient with bowel movement yesterday which was tested in C. difficile positive. Patient feels she is about to have diarrhea today.  Abdominal x-rays from 01/06/2015, with dilatation of small or large bowel concerning for possible adynamic ileus. Abdominal x-ray from 01/07/2015 with improvement with less number of dilated air-filled small bowel loops and less overall colonic distention. Abdominal films from 01/10/2015 with stable pattern of probable partial small bowel obstruction. Patient complaining of abdominal pain. Patient now C. difficile positive. Will change diet back to full liquids for now, continue conservative measures. Replete electrolytes keep potassium greater than 4 and magnesium greater than 2. Family at this time wants conservative treatment. Will change oral Flagyl to IV Flagyl, add oral vancomycin and vancomycin enemas. D/C PPI. Will discontinue MiraLAX. Once patient improves and tolerating oral intake better Will transition to oral vancomycin and treat C. difficile colitis for total of 2 weeks. Monitor clinically.   #2 C. difficile colitis Patient was C. difficile positive. Patient recently on antibiotics for UTI. Patient was admitted for partial small bowel obstruction versus ileus. Patient complaining of abdominal pain this morning and feels she is about to have diarrhea. Will discontinue PPI. Discontinue MiraLAX. Will place patient on IV Flagyl, oral vancomycin, vancomycin enema. As patient improves and oral intake improves will transition to oral vancomycin and treat for total of 2 weeks.  #3 hypokalemia Repleted.  #4 leukocytosis Likely reactive leukocytosis. WBC trended down.  #5 recent UTI Per family patient was started on ciprofloxacin for treatment for recent UTI. Patient has received  approximately 7 days of antibiotics.  #6 gastroesophageal reflux disease PPI.  #7 hypothyroidism Continue Synthroid.   #8 acute encephalopathy Patient with history of dementia. Patient also with some sundowning. Encephalopathy improving. D/C'd empiric IV antibiotics. Continue supportive care. Follow.  #9 dementia Stable.  #10 hypertension Stable. Continue diltiazem.   #11 prophylaxis Pepcid for GI prophylaxis. Lovenox for DVT prophylaxis.  Code Status: Full Family Communication: Updated patient, no family at bedside. Disposition Plan: Home with home health when ileus/ small bowel obstruction have improved and C. difficile has improved.   Consultants:  General surgery: Dr Ezzard Standing 01/07/15  Procedures:  Abdominal x-ray 01/06/2015, 01/07/2015, 01/10/2015  CT abdomen and pelvis 01/05/2015  Antibiotics:  IV Rocephin 01/05/2015 >>>>01/07/15  Oral vancomycin 01/12/2015  Vancomycin enema 01/12/2015  IV Flagyl 01/12/2015  Oral Flagyl 01/11/2015>>>> 01/12/2015  HPI/Subjective: Patient laying in bed. Patient states she's not feeling well. Patient complaining of abdominal pain. Patient states she feels like she is about to have diarrhea.  Objective: Filed Vitals:   01/12/15 0636  BP: 131/73  Pulse: 78  Temp: 97.6 F (36.4 C)  Resp: 18    Intake/Output Summary (Last 24 hours) at 01/12/15 0919 Last data filed at 01/11/15 2206  Gross per 24 hour  Intake 1057.5 ml  Output      0 ml  Net 1057.5 ml   Filed Weights   01/05/15 1744  Weight: 73.755 kg (162 lb 9.6 oz)    Exam:   General:  NAD  Cardiovascular: RRR  Respiratory: CTAB  Abdomen: Soft, tender to palpation in the right lower quadrant, positive bowel sounds, no rebound, no guarding  Musculoskeletal: No clubbing cyanosis or edema.  Data Reviewed: Basic Metabolic Panel:  Recent Labs Lab 01/06/15 1802 01/07/15 8119 01/08/15 0820 01/09/15 1478 01/10/15 0531 01/11/15 0900 01/12/15 0515  NA   --  137 135 138 138 139 139  K  --  4.2 4.6 4.9 4.4 4.1 4.5  CL  --  106 107 110 109 108 107  CO2  --  21* 24 19* GLUCOSE  --  117* 114* 115* 107* 131* 115*  BUN  --  CREATININE  --  0.67 0.82 0.70 0.67 0.64 0.70  CALCIUM  --  8.1* 7.9* 8.5* 8.2* 8.6* 8.4*  MG 1.7 1.7 2.2  --   --  1.9 1.9   Liver Function Tests:  Recent Labs Lab 01/05/15 1432  AST 28  ALT 22  ALKPHOS 57  BILITOT 0.6  PROT 6.8  ALBUMIN 3.3*   No results for input(s): LIPASE, AMYLASE in the last 168 hours. No results for input(s): AMMONIA in the last 168 hours. CBC:  Recent Labs Lab 01/05/15 1432  01/07/15 0529 01/08/15 0820 01/09/15 0523 01/11/15 0900 01/12/15 0515  WBC 11.3*  < > 12.8* 8.7 11.3* 6.0 6.9  NEUTROABS 7.6  --   --   --   --   --  4.5  HGB 13.3  < > 12.8 12.4 13.1 13.2 13.1  HCT 40.6  < > 39.1 37.5 39.9 40.7 40.0  MCV 85.8  < > 85.9 86.6 87.5 87.5 87.5  PLT 187  < > 233 202 275 284 291  < > = values in this interval not displayed. Cardiac Enzymes: No results for input(s): CKTOTAL, CKMB, CKMBINDEX, TROPONINI in the last 168 hours. BNP (last 3 results) No results for input(s): BNP in the last 8760 hours.  ProBNP (last 3 results) No results for input(s): PROBNP in the last 8760 hours.  CBG: No results for input(s): GLUCAP in the last 168 hours.  Recent Results (from the past 240 hour(s))  Urine culture     Status: None   Collection Time: 01/05/15  2:27 PM  Result Value Ref Range Status   Specimen Description URINE, CLEAN CATCH  Final   Special Requests NONE  Final   Culture   Final    Multiple bacterial morphotypes present, none predominant. Suggest appropriate recollection if clinically indicated. Performed at [facility]    Report Status 01/07/2015 FINAL  Final  Clostridium Difficile by PCR (not at [facility])     Status: Abnormal   Collection Time: 01/11/15  1:26 PM  Result Value Ref Range Status   C difficile by pcr POSITIVE (A) NEGATIVE Final     Comment: CRITICAL RESULT CALLED TO, READ BACK BY AND VERIFIED WITH: Linton Ham [id number] @ 1838 BY J SCOTTON      Studies: Dg Abd 2 Views  01/10/2015   CLINICAL DATA:  Lower abdominal pain. Lethargy. Follow-up ileus or obstruction.  EXAM: ABDOMEN - 2 VIEW  COMPARISON:  Previous examinations, the most recent dated 01/07/2015.  FINDINGS: No significant overall change in degree of small bowel dilatation with multiple air-fluid levels. Gas and stool in normal caliber colon and rectum. No free peritoneal air. Cholecystectomy clips. Left hip prosthesis. Lumbar and lower thoracic spine degenerative changes.  IMPRESSION: Stable pattern of probable partial small bowel obstruction.   Electronically Signed   By: Beckie Salts M.D.   On: 01/10/2015 10:57    Scheduled Meds: . darifenacin  7.5 mg Oral Daily  . diltiazem  180 mg Oral Daily  . enoxaparin (LOVENOX) injection  40 mg Subcutaneous Q24H  . famotidine  40 mg Oral Daily  .  levothyroxine  112 mcg Oral QAC breakfast  . vancomycin  500 mg Oral 4 times per day   And  . metronidazole  500 mg Intravenous Q8H  . polyethylene glycol  17 g Oral Daily  . saccharomyces boulardii  250 mg Oral BID  . vancomycin (VANCOCIN) rectal ENEMA  500 mg Rectal 4 times per day   Continuous Infusions: . sodium chloride 0.9 % 1,000 mL with potassium chloride 40 mEq infusion 75 mL/hr at 01/11/15 2206    Principal Problem:   SBO (small bowel obstruction) Active Problems:   C. difficile colitis   Hypothyroidism   HYPOKALEMIA   Essential hypertension   UTI (lower urinary tract infection)   Acute encephalopathy   Ileus   Adynamic ileus   Partial small bowel obstruction    Time spent: 35 mins    [facility] MD [facility] Pager [phone]. If 7PM-7AM, please contact night-coverage at [email or web address], password [facility] 01/12/2015, 9:19 AM  LOS: 7 days

## 2015-01-12 NOTE — Progress Notes (Signed)
PT Cancellation Note  Patient Details Name: CARLEENE CLAR MRN: [id number] DOB: 1916/08/01   Cancelled Treatment:     will hold off today per RN.  Receiving meds for C Diff and unable to tolerate any OOB activity.    Armando Reichert 01/12/2015, 12:12 PM

## 2015-01-13 DIAGNOSIS — I10 Essential (primary) hypertension: Secondary | ICD-10-CM

## 2015-01-13 DIAGNOSIS — K5669 Other intestinal obstruction: Secondary | ICD-10-CM

## 2015-01-13 DIAGNOSIS — A047 Enterocolitis due to Clostridium difficile: Secondary | ICD-10-CM

## 2015-01-13 DIAGNOSIS — G9340 Encephalopathy, unspecified: Secondary | ICD-10-CM

## 2015-01-13 LAB — BASIC METABOLIC PANEL
Anion gap: 7 (ref 5–15)
CHLORIDE: 110 mmol/L (ref 101–111)
CO2: 22 mmol/L (ref 22–32)
CREATININE: 0.74 mg/dL (ref 0.44–1.00)
Calcium: 8.7 mg/dL — ABNORMAL LOW (ref 8.9–10.3)
GFR calc non Af Amer: >60 mL/min (ref >60)
Glucose, Bld: 108 mg/dL — ABNORMAL HIGH (ref 65–99)
Potassium: 4 mmol/L (ref 3.5–5.1)
Sodium: 139 mmol/L (ref 135–145)

## 2015-01-13 LAB — CBC WITH DIFFERENTIAL/PLATELET
BASOS PCT: 2 % — AB (ref 0–1)
Basophils Absolute: 0.1 10*3/uL (ref 0.0–0.1)
EOS PCT: 2 % (ref 0–5)
Eosinophils Absolute: 0.1 10*3/uL (ref 0.0–0.7)
HEMATOCRIT: 40.1 % (ref 36.0–46.0)
HEMOGLOBIN: 12.8 g/dL (ref 12.0–15.0)
Lymphocytes Relative: 26 % (ref 12–46)
Lymphs Abs: 1.7 10*3/uL (ref 0.7–4.0)
MCH: 27.9 pg (ref 26.0–34.0)
MCHC: 31.9 g/dL (ref 30.0–36.0)
MCV: 87.4 fL (ref 78.0–100.0)
MONO ABS: 0.6 10*3/uL (ref 0.1–1.0)
Monocytes Relative: 9 % (ref 3–12)
Neutro Abs: 4 10*3/uL (ref 1.7–7.7)
Neutrophils Relative %: 61 % (ref 43–77)
Platelets: 290 10*3/uL (ref 150–400)
RBC: 4.59 MIL/uL (ref 3.87–5.11)
RDW: 14.4 % (ref 11.5–15.5)
WBC: 6.4 10*3/uL (ref 4.0–10.5)

## 2015-01-13 LAB — MAGNESIUM: MAGNESIUM: 1.8 mg/dL (ref 1.7–2.4)

## 2015-01-13 NOTE — Progress Notes (Signed)
Patient ID: [name], female   DOB: January 16, 1917, 81 y.o.   MRN: [id number] [facility] PROGRESS NOTE  [name] [id number]:[id number] DOB: August 10, 1916 DOA: 01/05/2015 PCP: [name], MD  Brief narrative:    81 yo female with history of dementia, pacemaker, hypertension, hypothyroidism who presented to [facility] ED with progressively worsening confusion, poor oral intake, more lethargy over 1 day prior to this admission. In ED, pt was hemodynamically stable. Blood work was notable for WBC 11.3 otherwise unremarkable. CT abd/pelvis was significant for SBO. Surgery was consulted. Hospital course complicated with C.diff. She is on PO vanco.  Barrier to discharge: anticipate D/C once diarrhea improves. In regards to diet, pt declined to have solids. She will continue full liquid diet.  Assessment/Plan:    Principal Problem: Small bowel obstruction / Failure to thrive in adult  - Pt admitted for SBO. Treated conservatively and her most recent abd x ray 6/21 shows slight improvement in SBO. - Surgery has seen the pt in consultation, recommended supportive care - She is currently on full liquid diet - Follow up abd x ray tomorrow am  Active Problems: C. difficile colitis / Leukocytosis  - Patient was C. difficile positive on this admission - Continue PO vanco and IV flagyl  - 3 BM's in past 24 hours  - Leukocytosis resolved  Hypokalemia - Due to GI losses - Supplemented   Hypothyroidism - Continue Synthroid.   Acute encephalopathy / dementia  - Stable   Hypertension - Continue Cardizem and metoprolol     DVT Prophylaxis  - Lovenox subQ ordered    Code Status: Full.  Family Communication:  Family not at the bedside this am  Disposition Plan: to SNF tomorrow 01/14/2015   IV access:  Peripheral IV  Procedures and diagnostic studies:    Dg Abd 2 Views 01/12/2015   Slight improvement in the degree of small bowel dilatation when compared with the prior exam.    Dg  Abd 2 Views 01/10/2015  Stable pattern of probable partial small bowel obstruction.     Ct Abdomen Pelvis W Contrast 01/05/2015   Findings most consistent with small bowel obstruction without evidence of mass or ischemia.  Patchy right lower lobe airspace disease could be due to atelectasis, aspiration or pneumonia.  No finding to explain the patient's urinary symptoms.  4.4 cm infrarenal abdominal aortic aneurysm.  Dg Abd 2 Views 01/07/2015 Interval improvement with less number of dilated air-filled small bowel loops. Less overall colonic distention. Findings may be due to improving ileus or obstruction.     Dg Abd 2 Views 01/06/2015   1. Evolution of bowel gas pattern to now include small and large bowel distention. Although a small bowel transition point was on the prior CT, the plain film pattern suggests an adynamic ileus. 2. Oral contrast is not seen in the colon, but much of it has been absorbed and is now in the bladder.    Medical Consultants:  Surgery   Other Consultants:  Physical therapy  IAnti-Infectives:   Rocephin 01/05/2015  -->01/07/15 Oral vancomycin 01/12/2015 Vancomycin enema 01/12/2015 IV Flagyl 01/12/2015 Oral Flagyl 01/11/2015 -- 01/12/2015   Manson Passey, MD  [facility] Pager [phone]  Time spent in minutes: 25 minutes  If 7PM-7AM, please contact night-coverage [email or web address] Password [facility] 01/13/2015, 5:09 PM   LOS: 8 days    HPI/Subjective: No acute overnight events. Patient reports no nausea, vomiting.   Objective: Filed Vitals:   01/12/15 1400 01/12/15 2110 01/13/15 0500 01/13/15  1400  BP: 135/80 159/74 142/74 145/70  Pulse: 80 75 85 86  Temp: 98.1 F (36.7 C) 98.1 F (36.7 C) 97.7 F (36.5 C) 98.5 F (36.9 C)  TempSrc: Oral Oral Oral Oral  Resp: Height:      Weight:      SpO2: 93% 96% 96% 95%    Intake/Output Summary (Last 24 hours) at 01/13/15 1709 Last data filed at 01/13/15 1300  Gross per 24 hour  Intake    360  ml  Output      1 ml  Net    359 ml    Exam:   General:  Pt is alert, follows commands appropriately, not in acute distress  Cardiovascular: Regular rate and rhythm, S1/S2, no murmurs  Respiratory: Clear to auscultation bilaterally, no wheezing, no crackles, no rhonchi  Abdomen: Soft, non tender, non distended, bowel sounds present  Extremities: No edema, pulses DP and PT palpable bilaterally  Neuro: Grossly nonfocal  Data Reviewed: Basic Metabolic Panel:  Recent Labs Lab 01/07/15 0529 01/08/15 0820 01/09/15 0523 01/10/15 0531 01/11/15 0900 01/12/15 0515 01/13/15 0540  NA 137 135 138 138 139 139 139  K 4.2 4.6 4.9 4.4 4.1 4.5 4.0  CL 106 107 110 109 108 107 110  CO2 21* 24 19* GLUCOSE 117* 114* 115* 107* 131* 115* 108*  BUN <5*  CREATININE 0.67 0.82 0.70 0.67 0.64 0.70 0.74  CALCIUM 8.1* 7.9* 8.5* 8.2* 8.6* 8.4* 8.7*  MG 1.7 2.2  --   --  1.9 1.9 1.8   Liver Function Tests: No results for input(s): AST, ALT, ALKPHOS, BILITOT, PROT, ALBUMIN in the last 168 hours. No results for input(s): LIPASE, AMYLASE in the last 168 hours. No results for input(s): AMMONIA in the last 168 hours. CBC:  Recent Labs Lab 01/08/15 0820 01/09/15 0523 01/11/15 0900 01/12/15 0515 01/13/15 0540  WBC 8.7 11.3* 6.0 6.9 6.4  NEUTROABS  --   --   --  4.5 4.0  HGB 12.4 13.1 13.2 13.1 12.8  HCT 37.5 39.9 40.7 40.0 40.1  MCV 86.6 87.5 87.5 87.5 87.4  PLT 202 275 284 291 290   Cardiac Enzymes: No results for input(s): CKTOTAL, CKMB, CKMBINDEX, TROPONINI in the last 168 hours. BNP: Invalid input(s): POCBNP CBG: No results for input(s): GLUCAP in the last 168 hours.  Recent Results (from the past 240 hour(s))  Urine culture     Status: None   Collection Time: 01/05/15  2:27 PM  Result Value Ref Range Status   Specimen Description URINE, CLEAN CATCH  Final   Special Requests NONE  Final   Culture   Final    Multiple bacterial morphotypes present, none  predominant. Suggest appropriate recollection if clinically indicated. Performed at [facility]    Report Status 01/07/2015 FINAL  Final  Clostridium Difficile by PCR (not at [facility])     Status: Abnormal   Collection Time: 01/11/15  1:26 PM  Result Value Ref Range Status   C difficile by pcr POSITIVE (A) NEGATIVE Final    Comment: CRITICAL RESULT CALLED TO, READ BACK BY AND VERIFIED WITH: Linton Ham [id number] @ 1838 BY J SCOTTON      Scheduled Meds: . darifenacin  7.5 mg Oral Daily  . diltiazem  180 mg Oral Daily  . enoxaparin (LOVENOX) injection  40 mg Subcutaneous Q24H  . famotidine (PEPCID) IV  20 mg Intravenous Q24H  .  levothyroxine  112 mcg Oral QAC breakfast  . vancomycin  500 mg Oral 4 times per day   And  . metronidazole  500 mg Intravenous Q8H  . saccharomyces boulardii  250 mg Oral BID  . vancomycin (VANCOCIN) rectal ENEMA  500 mg Rectal 4 times per day   Continuous Infusions: . sodium chloride 0.9 % 1,000 mL with potassium chloride 40 mEq infusion 75 mL/hr at 01/13/15 0413

## 2015-01-13 NOTE — Progress Notes (Signed)
Clinical Social Work  Per progression meeting, MD reports patient is not medically stable to DC today. CSW updated [employer] re: DC plans and will continue to follow to assist with transfer to SNF when stable.  [place], [place] [phone]

## 2015-01-14 MED ORDER — ALPRAZOLAM 0.5 MG PO TABS: 0.2500 mg | ORAL_TABLET | Freq: Two times a day (BID) | ORAL | Status: DC | PRN

## 2015-01-14 MED ORDER — METRONIDAZOLE 500 MG PO TABS: 500.0000 mg | ORAL_TABLET | Freq: Three times a day (TID) | ORAL | Status: DC

## 2015-01-14 MED ORDER — FAMOTIDINE 20 MG PO TABS
20.0000 mg | ORAL_TABLET | Freq: Every day | ORAL | Status: DC
  Administered 2015-01-14: 20 mg via ORAL
  Filled 2015-01-14: qty 1

## 2015-01-14 MED ORDER — FAMOTIDINE 20 MG PO TABS: 20.0000 mg | ORAL_TABLET | Freq: Every day | ORAL | Status: DC

## 2015-01-14 MED ORDER — SACCHAROMYCES BOULARDII 250 MG PO CAPS: 250.0000 mg | ORAL_CAPSULE | Freq: Two times a day (BID) | ORAL | Status: DC

## 2015-01-14 MED ORDER — ONDANSETRON HCL 4 MG PO TABS: 4.0000 mg | ORAL_TABLET | Freq: Four times a day (QID) | ORAL | Status: DC | PRN

## 2015-01-14 MED ORDER — VANCOMYCIN 50 MG/ML ORAL SOLUTION: 500.0000 mg | Freq: Four times a day (QID) | ORAL | Status: AC

## 2015-01-14 NOTE — Discharge Summary (Signed)
Physician Discharge Summary  [name] OVA:[id number] DOB: [birth year] DOA: 01/05/2015  PCP: Cindy Hoit, MD  Admit date: 01/05/2015 Discharge date: 01/14/2015  Recommendations for Outpatient Follow-up:  1. Please continue vancomycin and Flagyl as prescribed through and including 01/26/2015 for C. difficile colitis.  Discharge Diagnoses:  Principal Problem:   SBO (small bowel obstruction) Active Problems:   Hypothyroidism   HYPOKALEMIA   Essential hypertension   UTI (lower urinary tract infection)   Acute encephalopathy   Ileus   Adynamic ileus   Partial small bowel obstruction   C. difficile colitis    Discharge Condition: stable   Diet recommendation: as tolerated; she will continue full liquid diet per patient request.  History of present illness:  81 yo female with history of dementia, pacemaker, hypertension, hypothyroidism who presented to [name] with progressively worsening confusion, poor oral intake, more lethargy over 1 day prior to this admission. In ED, pt was hemodynamically stable. Blood work was notable for WBC 11.3 otherwise unremarkable. CT abd/pelvis was significant for SBO. Surgery was consulted. Patient was treated non-operatively. Her small bowel seems to have gotten better based on x-ray study 01/12/2015. [name] course complicated with C.diff. She is on PO vanco and Flagyl. She will continue this to medications through 01/26/2015.   [name] Course:   Assessment/Plan:    Principal Problem: Small bowel obstruction / Failure to thrive in adult  - Pt admitted for SBO. Patient was treated nonoperatively, she had abdominal x-ray studies done to monitor resolution of small bowel obstruction. - Based on last x-ray 01/12/2015 there is slight improvement in small bowel obstruction. - Patient was seen by surgery in consultation. They recommended monitoring progress with abdominal x-ray which was done as mentioned above. - Patient is on full liquid  diet and the reason why she is not taking solid food is because she reported it hurts her teeth so she avoids solid food. - Stable for discharge to skilled nursing facility today.   Active Problems: C. difficile colitis / Leukocytosis  - Patient was C. difficile positive on this admission - Patient on rectal and by mouth vancomycin as well as IV Flagyl. - On discharge we will continue by mouth Vanco and by mouth Flagyl for 12 days, through 01/26/2015. - Has had 1 bowel movement in past 24 hours.  Essential hypertension - Continue Cardizem but hold metoprolol because of borderline low heart rate. Current heart rate is 61.  Hypokalemia - Due to GI losses - Supplemented and now within normal limits.  Hypothyroidism - Continue Synthroid.   Acute encephalopathy / dementia  - Stable, no changes in mental status.  Hypertension - Continue Cardizem on discharge. Continue to hold metoprolol because of borderline bradycardia. Her current heart rate is 61.    DVT Prophylaxis  - Lovenox subQ ordered while patient in [name]   Code Status: Full.  Family Communication: Family not at the bedside this am    IV access:  Peripheral IV  Procedures and diagnostic studies:   Dg Abd 2 Views 01/12/2015 Slight improvement in the degree of small bowel dilatation when compared with the prior exam.  Dg Abd 2 Views 01/10/2015 Stable pattern of probable partial small bowel obstruction.   Ct Abdomen Pelvis W Contrast 01/05/2015 Findings most consistent with small bowel obstruction without evidence of mass or ischemia. Patchy right lower lobe airspace disease could be due to atelectasis, aspiration or pneumonia. No finding to explain the patient's urinary symptoms. 4.4 cm infrarenal abdominal aortic aneurysm.  Dg  Abd 2 Views 01/07/2015 Interval improvement with less number of dilated air-filled small bowel loops. Less overall colonic distention. Findings may be due to improving  ileus or obstruction.   Dg Abd 2 Views 01/06/2015 1. Evolution of bowel gas pattern to now include small and large bowel distention. Although a small bowel transition point was on the prior CT, the plain film pattern suggests an adynamic ileus. 2. Oral contrast is not seen in the colon, but much of it has been absorbed and is now in the bladder.   Medical Consultants:  Surgery   Other Consultants:  Physical therapy  IAnti-Infectives:   Rocephin 01/05/2015 -->01/07/15 Oral vancomycin 01/12/2015 --> 01/26/2015 Oral Flagyl 01/11/2015 --> 01/12/2015 --> 01/26/2015 Vancomycin enema 01/12/2015 --> 01/14/2015  IV Flagyl 01/12/2015 --> 01/14/2015    Signed:  Manson Passey, MD  [facility] 01/14/2015, 9:09 AM  Pager #: [phone]  Time spent in minutes: more than 30 minutes   Discharge Exam: Filed Vitals:   01/14/15 0801  BP: 152/71  Pulse: 61  Temp: 97.9 F (36.6 C)  Resp: 21   Filed Vitals:   01/13/15 1400 01/13/15 2214 01/14/15 0541 01/14/15 0801  BP: 145/70 166/71 146/79 152/71  Pulse: 86 62 88 61  Temp: 98.5 F (36.9 C) 98.6 F (37 C) 97.5 F (36.4 C) 97.9 F (36.6 C)  TempSrc: Oral Oral Oral Oral  Resp: 18 16 18 21   Height:      Weight:      SpO2: 95% 97% 96% 96%    General: Pt is alert, not in acute distress Cardiovascular: Regular rate and rhythm, S1/S2 (+) Respiratory: No wheezing, no crackles, no rhonchi Abdominal: Non distended, bowel sounds +, no guarding Extremities: no edema, no cyanosis, pulses palpable bilaterally DP and PT Neuro: Grossly nonfocal  Discharge Instructions  Discharge Instructions    Call MD for:  difficulty breathing, headache or visual disturbances    Complete by:  As directed      Call MD for:  persistant nausea and vomiting    Complete by:  As directed      Call MD for:  severe uncontrolled pain    Complete by:  As directed      Diet - low sodium heart healthy    Complete by:  As directed      Discharge  instructions    Complete by:  As directed   1. Please continue vancomycin and Flagyl as prescribed through and including 01/26/2015 for C. difficile colitis.     Increase activity slowly    Complete by:  As directed             Medication List    STOP taking these medications        aspirin EC 81 MG tablet     ciprofloxacin 500 MG tablet  Commonly known as:  CIPRO     citalopram 10 MG tablet  Commonly known as:  CELEXA     metoprolol tartrate 25 MG tablet  Commonly known as:  LOPRESSOR     pantoprazole 40 MG tablet  Commonly known as:  PROTONIX     polyethylene glycol packet  Commonly known as:  MIRALAX / GLYCOLAX      TAKE these medications        acetaminophen 325 MG tablet  Commonly known as:  TYLENOL  Take 650 mg by mouth every 4 (four) hours as needed. For pain.     allopurinol 100 MG tablet  Commonly known as:  ZYLOPRIM  Take 100 mg by mouth daily.     ALPRAZolam 0.5 MG tablet  Commonly known as:  XANAX  Take 0.5 tablets (0.25 mg total) by mouth 2 (two) times daily as needed for anxiety.     beta carotene w/minerals tablet  Take 1 tablet by mouth daily.     diltiazem 180 MG 24 hr capsule  Commonly known as:  DILACOR XR  Take 180 mg by mouth daily.     ENSURE  Take 237 mLs by mouth 2 (two) times daily.     famotidine 20 MG tablet  Commonly known as:  PEPCID  Take 1 tablet (20 mg total) by mouth daily.     folic acid 1 MG tablet  Commonly known as:  FOLVITE  Take 1 mg by mouth daily.     levothyroxine 112 MCG tablet  Commonly known as:  SYNTHROID, LEVOTHROID  Take 112 mcg by mouth daily before breakfast.     metroNIDAZOLE 500 MG tablet  Commonly known as:  FLAGYL  Take 1 tablet (500 mg total) by mouth 3 (three) times daily.     ondansetron 4 MG tablet  Commonly known as:  ZOFRAN  Take 1 tablet (4 mg total) by mouth every 6 (six) hours as needed for nausea.     saccharomyces boulardii 250 MG capsule  Commonly known as:  FLORASTOR  Take 1  capsule (250 mg total) by mouth 2 (two) times daily.     vancomycin 50 mg/mL oral solution  Commonly known as:  VANCOCIN  Take 10 mLs (500 mg total) by mouth every 6 (six) hours.     VESICARE 5 MG tablet  Generic drug:  solifenacin  Take 1 tablet by mouth daily.           Follow-up Information    Follow up with [facility].   Why:  for PT, OT, RN   Contact information:   [address] [place] [address] [phone]       Schedule an appointment as soon as possible for a visit with Cindy Hoit, MD.   Specialty:  Family Medicine   Why:  Follow up appt after recent hospitalization   Contact information:   [address] [place] [address] [phone]        The results of significant diagnostics from this hospitalization (including imaging, microbiology, ancillary and laboratory) are listed below for reference.    Significant Diagnostic Studies: Ct Abdomen Pelvis W Contrast  01/05/2015   CLINICAL DATA:  Urinary retention and abnormal odor to the patient urine. Symptoms for 1 week.  EXAM: CT ABDOMEN AND PELVIS WITH CONTRAST  TECHNIQUE: Multidetector CT imaging of the abdomen and pelvis was performed using the standard protocol following bolus administration of intravenous contrast.  CONTRAST:  100 mL OMNIPAQUE IOHEXOL 300 MG/ML SOLN  COMPARISON:  CT abdomen and pelvis 10/15/2013. CT lumbar spine 03/01/2010.  FINDINGS: Patchy airspace disease is seen in the right lower lobe posteriorly. Imaged left lung is clear. No pleural or pericardial effusion is identified. There is cardiomegaly. Leads from pacing device are noted. Calcific coronary artery disease is seen. There is a small hiatal hernia.  The patient is status post cholecystectomy. Mild intra and extrahepatic biliary ductal prominence is unchanged. Calcification in the inferior right lobe of the liver is unchanged. The liver is otherwise unremarkable. There is a 0.8 cm hyperattenuating  lesion in the spleen. Bilateral renal cysts are identified. The kidneys are otherwise  unremarkable. The pancreas is atrophic. In the midbody of the pancreas, there is a hypo attenuating lesion measuring 1.6 x 1.2 cm which is unchanged compared to the 2011 examination and likely the sequela of prior inflammatory process.  There is dilatation of small bowel loops up to 4.3 cm with air-fluid levels present. Distal small bowel loops are completely decompressed. Transition point appears be in the central aspect of the pelvis on image 56. No mass, pneumatosis, portal venous gas or free intraperitoneal air is identified. There is a small volume of free pelvic fluid but no abscess is seen. Surgical anastomosis in region of the cecum is noted. Liquid stool is seen in the ascending colon. Sigmoid diverticular disease is noted. The stomach is unremarkable.  An infrarenal abdominal aortic aneurysm measuring 4.4 x 4.2 cm is identified and not notably changed based on remeasurement.  No lytic or sclerotic bony lesion is seen. There is lower lumbar spondylosis with grade 1 anterolisthesis L4 on L5 due to facet degenerative change. The patient is status post left hip replacement. Degenerative disease about the right hip is noted.  IMPRESSION: Findings most consistent with small bowel obstruction without evidence of mass or ischemia.  Patchy right lower lobe airspace disease could be due to atelectasis, aspiration or pneumonia.  No finding to explain the patient's urinary symptoms.  4.4 cm infrarenal abdominal aortic aneurysm. Recommend followup by ultrasound in 1 year. This recommendation follows ACR consensus guidelines: White Paper of the ACR Incidental Findings Committee II on Vascular Findings. J Am Coll Radiol 2013; 10:789-794.  Tiny hyper attenuating lesion the spleen is likely a hemangioma.  Status post cholecystectomy. Mild intra and extrahepatic biliary ductal dilatation is unchanged.  No change in a low attenuating lesion  in the body of the pancreas since 2011 most consistent with sequela of prior infectious or inflammatory process.   Electronically Signed   By: Drusilla Kanner M.D.   On: 01/05/2015 16:01   Dg Abd 2 Views  01/12/2015   CLINICAL DATA:  Followup adynamic ileus  EXAM: ABDOMEN - 2 VIEW  COMPARISON:  01/10/2015  FINDINGS: Scattered large and small bowel gas is noted. No definitive obstructive changes are seen. The degree of small bowel gas has decreased slightly in the interval. No free air is seen. Scattered air-fluid levels are again noted.  IMPRESSION: Slight improvement in the degree of small bowel dilatation when compared with the prior exam.   Electronically Signed   By: Alcide Clever M.D.   On: 01/12/2015 14:36   Dg Abd 2 Views  01/10/2015   CLINICAL DATA:  Lower abdominal pain. Lethargy. Follow-up ileus or obstruction.  EXAM: ABDOMEN - 2 VIEW  COMPARISON:  Previous examinations, the most recent dated 01/07/2015.  FINDINGS: No significant overall change in degree of small bowel dilatation with multiple air-fluid levels. Gas and stool in normal caliber colon and rectum. No free peritoneal air. Cholecystectomy clips. Left hip prosthesis. Lumbar and lower thoracic spine degenerative changes.  IMPRESSION: Stable pattern of probable partial small bowel obstruction.   Electronically Signed   By: Beckie Salts M.D.   On: 01/10/2015 10:57   Dg Abd 2 Views  01/07/2015   CLINICAL DATA:  Distended abdomen.  EXAM: ABDOMEN - 2 VIEW  COMPARISON:  01/06/2015 and CT 01/05/2015  FINDINGS: Surgical clips over the right upper quadrant. Air is present throughout the colon. No dilated colonic loops. There is new air-filled prominent bowel loop in the right upper quadrant difficult to distinguish whether this is  larger small-bowel but likely dilated small bowel. Overall improvement compared to the previous exam with less air-filled dilated small bowel loops. Several scattered air-fluid levels on the decubitus film. No evidence  of free peritoneal air. Remainder of the exam is unchanged.  IMPRESSION: Interval improvement with less number of dilated air-filled small bowel loops. Less overall colonic distention. Findings may be due to improving ileus or obstruction.   Electronically Signed   By: Elberta Fortis M.D.   On: 01/07/2015 15:29   Dg Abd 2 Views  01/06/2015   CLINICAL DATA:  Small bowel obstruction.  EXAM: ABDOMEN - 2 VIEW  COMPARISON:  Abdominal CT from yesterday  FINDINGS: Evolution of the abnormal bowel gas pattern with now diffuse small and large bowel distention. A small bowel transition point was seen previously, presumably partial obstruction given today's finding. Intraluminal contrast is not well visualized; much of it has been absorbed and is now on the urinary bladder. No pneumoperitoneum.  IMPRESSION: 1. Evolution of bowel gas pattern to now include small and large bowel distention. Although a small bowel transition point was on the prior CT, the plain film pattern suggests an adynamic ileus. 2. Oral contrast is not seen in the colon, but much of it has been absorbed and is now in the bladder.   Electronically Signed   By: Marnee Spring M.D.   On: 01/06/2015 10:35    Microbiology: Urine culture     Status: None   Collection Time: 01/05/15  2:27 PM  Result Value Ref Range Status   Specimen Description URINE, CLEAN CATCH  Final   Special Requests NONE  Final   Culture   Final    Multiple bacterial morphotypes present, none predominant. Suggest appropriate recollection if clinically indicated. Performed at [facility]    Report Status 01/07/2015 FINAL  Final  Clostridium Difficile by PCR (not at [name])     Status: Abnormal   Collection Time: 01/11/15  1:26 PM  Result Value Ref Range Status   C difficile by pcr POSITIVE (A) NEGATIVE Final     Labs: Basic Metabolic Panel:  Recent Labs Lab 01/08/15 0820 01/09/15 0523 01/10/15 0531 01/11/15 0900 01/12/15 0515 01/13/15 0540  NA 135 138  138 139 139 139  K 4.6 4.9 4.4 4.1 4.5 4.0  CL 107 110 109 108 107 110  CO2 24 19* GLUCOSE 114* 115* 107* 131* 115* 108*  BUN <5*  CREATININE 0.82 0.70 0.67 0.64 0.70 0.74  CALCIUM 7.9* 8.5* 8.2* 8.6* 8.4* 8.7*  MG 2.2  --   --  1.9 1.9 1.8   Liver Function Tests: No results for input(s): AST, ALT, ALKPHOS, BILITOT, PROT, ALBUMIN in the last 168 hours. No results for input(s): LIPASE, AMYLASE in the last 168 hours. No results for input(s): AMMONIA in the last 168 hours. CBC:  Recent Labs Lab 01/08/15 0820 01/09/15 0523 01/11/15 0900 01/12/15 0515 01/13/15 0540  WBC 8.7 11.3* 6.0 6.9 6.4  NEUTROABS  --   --   --  4.5 4.0  HGB 12.4 13.1 13.2 13.1 12.8  HCT 37.5 39.9 40.7 40.0 40.1  MCV 86.6 87.5 87.5 87.5 87.4  PLT 202 275 284 291 290   Cardiac Enzymes: No results for input(s): CKTOTAL, CKMB, CKMBINDEX, TROPONINI in the last 168 hours. BNP: BNP (last 3 results) No results for input(s): BNP in the last 8760 hours.  ProBNP (last 3 results) No results for input(s): PROBNP  in the last 8760 hours.  CBG: No results for input(s): GLUCAP in the last 168 hours.

## 2015-01-14 NOTE — Clinical Social Work Placement (Signed)
   CLINICAL SOCIAL WORK PLACEMENT  NOTE  Date:  01/14/2015  Patient Details  Name: [name] MRN: [id number] Date of Birth: 1916-12-12  Clinical Social Work is seeking post-discharge placement for this patient at the Skilled  Nursing Facility level of care (*CSW will initial, date and re-position this form in  chart as items are completed):  Yes   Patient/family provided with [facility] Clinical Social Work Department's list of facilities offering this level of care within the geographic area requested by the patient (or if unable, by the patient's family).  Yes   Patient/family informed of their freedom to choose among providers that offer the needed level of care, that participate in Medicare, Medicaid or managed care program needed by the patient, have an available bed and are willing to accept the patient.  Yes   Patient/family informed of [facility]'s ownership interest in [facility] and [facility], as well as of the fact that they are under no obligation to receive care at these facilities.  PASRR submitted to EDS on       PASRR number received on       Existing PASRR number confirmed on 01/12/15     FL2 transmitted to all facilities in geographic area requested by pt/family on 01/12/15     FL2 transmitted to all facilities within larger geographic area on       Patient informed that his/her managed care company has contracts with or will negotiate with certain facilities, including the following:        Yes   Patient/family informed of bed offers received.  Patient chooses bed at [facility]     Physician recommends and patient chooses bed at      Patient to be transferred to [facility] on 01/14/15.  Patient to be transferred to facility by [facility]     Patient family notified on 01/14/15 of transfer.  Name of family member notified:  David-grandson     PHYSICIAN       Additional Comment:     _______________________________________________ Marnee Spring, LCSW 01/14/2015, 10:31 AM

## 2015-01-14 NOTE — Progress Notes (Signed)
Gave report to Ander Slade, Charity fundraiser at [facility]. Left number if she had additional questions.

## 2015-01-14 NOTE — Discharge Instructions (Signed)
Clostridium Difficile Infection °Clostridium difficile (C. difficile) is a bacteria found in the intestinal tract or colon. Under certain conditions, it causes diarrhea and sometimes severe disease. The severe form of the disease is known as pseudomembranous colitis (often called C. difficile colitis). This disease can damage the lining of the colon or cause the colon to become enlarged (toxic megacolon). °CAUSES °Your colon normally contains many different bacteria, including C. difficile. The balance of bacteria in your colon can change during illness. This is especially true when you take antibiotic medicine. Taking antibiotics may allow the C. difficile to grow, multiply excessively, and make a toxin that then causes illness. The elderly and people with certain medical conditions have a greater risk of getting C. difficile infections. °SYMPTOMS °· Watery diarrhea. °· Fever. °· Fatigue. °· Loss of appetite. °· Nausea. °· Abdominal swelling, pain, or tenderness. °· Dehydration. °DIAGNOSIS °Your symptoms may make your caregiver suspect a C. difficile infection, especially if you have used antibiotics in the preceding weeks. However, there are only 2 ways to know for certain whether you have a C. difficile infection: °· A lab test that finds the toxin in your stool. °· The specific appearance of an abnormality (pseudomembrane) in your colon. This can only be seen by doing a sigmoidoscopy or colonoscopy. These procedures involve passing an instrument through your rectum to look at the inside of your colon. °Your caregiver will help determine if these tests are necessary. °TREATMENT °· Most people are successfully treated with one of two specific antibiotics, usually given by mouth. Other antibiotics you are receiving are stopped if possible. °· Intravenous (IV) fluids and correction of electrolyte imbalance may be necessary. °· Rarely, surgery may be needed to remove the infected part of the intestines. °· Careful  hand washing by you and your caregivers is important to prevent the spread of infection. In the hospital, your caregivers may also put on gowns and gloves to prevent the spread of the C. difficile bacteria. Your room is also cleaned regularly with a solution containing bleach or a product that is known to kill C. difficile. °HOME CARE INSTRUCTIONS °· Drink enough fluids to keep your urine clear or pale yellow. Avoid milk, caffeine, and alcohol. °· Ask your caregiver for specific rehydration instructions. °· Try eating small, frequent meals rather than large meals. °· Take your antibiotics as directed. Finish them even if you start to feel better. °· Do not use medicines to slow diarrhea. This could delay healing or cause complications. °· Wash your hands thoroughly after using the bathroom and before preparing food. °· Make sure people who live with you wash their hands often, too. °· Carefully disinfect all surfaces with a product that contains chlorine bleach. °SEEK MEDICAL CARE IF: °· Diarrhea persists longer than expected or recurs after completing your course of antibiotic treatment for the C. difficile infection. °· You have trouble staying hydrated. °SEEK IMMEDIATE MEDICAL CARE IF: °· You develop a new fever. °· You have increasing abdominal pain or tenderness. °· There is blood in your stools, or your stools are dark black and tarry. °· You cannot hold down food or liquids. °MAKE SURE YOU: °· Understand these instructions. °· Will watch your condition. °· Will get help right away if you are not doing well or get worse. °Document Released: 04/19/2005 Document Revised: 11/24/2013 Document Reviewed: 12/16/2010 °ExitCare® Patient Information ©2015 [employer]. This information is not intended to replace advice given to you by your health care provider. Make sure you   discuss any questions you have with your health care provider. ° °

## 2015-01-14 NOTE — Progress Notes (Signed)
Clinical Social Work  CSW faxed DC summary to [facility] who is agreeable to accept patient today. CSW prepared DC packet with FL2, DC summary, hard scripts, and chart copy included. CSW informed patient and grandson [name]) of DC plans. [name] will plan to meet patient at SNF to complete admission paperwork. Grandson reports he feels relieved that patient is going to SNF and feels this is the best plan for patient.   Grandson prefers [facility] for transportation and is aware of no guarantee of payment. [facility] #:[id number].  CSW is signing off but available if needed.  [place], [place] [phone]

## 2015-01-18 ENCOUNTER — Encounter: Payer: Medicare Other | Admitting: Internal Medicine

## 2015-01-20 ENCOUNTER — Encounter: Payer: Self-pay | Admitting: Internal Medicine

## 2015-05-03 ENCOUNTER — Ambulatory Visit (INDEPENDENT_AMBULATORY_CARE_PROVIDER_SITE_OTHER): Payer: Medicare Other | Admitting: Internal Medicine

## 2015-05-03 ENCOUNTER — Encounter: Payer: Self-pay | Admitting: Internal Medicine

## 2015-05-03 VITALS — BP 128/74 | HR 64 | Ht 64.0 in | Wt 165.8 lb

## 2015-05-03 DIAGNOSIS — I4430 Unspecified atrioventricular block: Secondary | ICD-10-CM | POA: Diagnosis not present

## 2015-05-03 DIAGNOSIS — I495 Sick sinus syndrome: Secondary | ICD-10-CM | POA: Diagnosis not present

## 2015-05-03 DIAGNOSIS — I10 Essential (primary) hypertension: Secondary | ICD-10-CM

## 2015-05-03 LAB — CUP PACEART INCLINIC DEVICE CHECK
Battery Impedance: 413 Ohm
Battery Remaining Longevity: 84 mo
Brady Statistic AP VP Percent: 94 %
Brady Statistic AS VP Percent: 6 %
Date Time Interrogation Session: 20161010164811
Lead Channel Impedance Value: 431 Ohm
Lead Channel Pacing Threshold Amplitude: 0.75 V
Lead Channel Pacing Threshold Pulse Width: 0.4 ms
Lead Channel Pacing Threshold Pulse Width: 0.4 ms
Lead Channel Sensing Intrinsic Amplitude: 2.8 mV
Lead Channel Setting Pacing Amplitude: 2 V
Lead Channel Setting Pacing Pulse Width: 0.4 ms
Lead Channel Setting Sensing Sensitivity: 2.8 mV
MDC IDC MSMT BATTERY VOLTAGE: 2.77 V
MDC IDC MSMT LEADCHNL RV IMPEDANCE VALUE: 650 Ohm
MDC IDC MSMT LEADCHNL RV PACING THRESHOLD AMPLITUDE: 0.75 V
MDC IDC SET LEADCHNL RV PACING AMPLITUDE: 2.5 V
MDC IDC STAT BRADY AP VS PERCENT: 1 %
MDC IDC STAT BRADY AS VS PERCENT: 0 %

## 2015-05-03 NOTE — Progress Notes (Signed)
PCP: [name], MD  The patient presents today for routine cardiology followup.  She is doing reasonably well for her age.  Sh has dementia.  She states "I feel good today".   Today, she denies symptoms of palpitations, chest pain, shortness of breath, orthopnea, PND, lower extremity edema, presyncope, syncope, or neurologic sequela.  She is very unsteady with ambulation.  The patient feels that she is tolerating medications without difficulties and is otherwise without complaint today.   Past Medical History  Diagnosis Date  . Complete heart block [facility])     s/p PPM by Dr Amil Amen 2004, recent Gen Change  07/13/10  . Small bowel obstruction (HCC)   . Depression   . Hypertension   . Hypokalemia   . Recurrent UTI   . Gout   . GERD (gastroesophageal reflux disease)   . DVT (deep venous thrombosis) [facility])    Past Surgical History  Procedure Laterality Date  . Insert / replace / remove pacemaker      initial pacemaker by Dr Amil Amen 2004, replaced by [facility] for ERI 07/13/10  . Hip arthroplasty  07/02/2011    Procedure: ARTHROPLASTY BIPOLAR HIP;  Surgeon: Harvie Junior;  Location: [facility];  Service: Orthopedics;  Laterality: Left;    Current Outpatient Prescriptions  Medication Sig Dispense Refill  . acetaminophen (TYLENOL) 325 MG tablet Take 650 mg by mouth every 4 (four) hours as needed. For pain.     Marland Kitchen allopurinol (ZYLOPRIM) 100 MG tablet Take 100 mg by mouth daily.      Marland Kitchen ALPRAZolam (XANAX) 0.5 MG tablet Take 0.5 tablets (0.25 mg total) by mouth 2 (two) times daily as needed for anxiety. 10 tablet 0  . beta carotene w/minerals (OCUVITE) tablet Take 1 tablet by mouth daily.    Marland Kitchen diltiazem (DILACOR XR) 180 MG 24 hr capsule Take 180 mg by mouth daily.      Marland Kitchen ENSURE (ENSURE) Take 237 mLs by mouth 2 (two) times daily.      . folic acid (FOLVITE) 1 MG tablet Take 1 mg by mouth daily.      Marland Kitchen levothyroxine (SYNTHROID, LEVOTHROID) 112 MCG tablet Take 112 mcg by mouth daily before breakfast.     . metoprolol tartrate (LOPRESSOR) 25 MG tablet Take 25 mg by mouth 2 (two) times daily. Take half tablet 12.5mg  2 times daily  3  . pantoprazole (PROTONIX) 40 MG tablet Take 40 mg by mouth daily.    Marland Kitchen saccharomyces boulardii (FLORASTOR) 250 MG capsule Take 1 capsule (250 mg total) by mouth 2 (two) times daily. 60 capsule 0  . VESICARE 5 MG tablet Take 1 tablet by mouth daily.    . [DISCONTINUED] POLYSACCHARIDE IRON (IRON POLYSACCHARIDES) 150 MG CAPS Take 150 mg by mouth 2 (two) times daily.     . [DISCONTINUED] warfarin (COUMADIN) 2 MG tablet Take 1.5 tablets (3 mg total) by mouth daily at 6 PM. 45 tablet 0   No current facility-administered medications for this visit.    Allergies  Allergen Reactions  . Aspirin     Prior history of stomach problems (? Ulcer), instructed to resume for stroke  . Ativan [Lorazepam] Other (See Comments)    Has opposite effect "makes her crazy"  . Penicillins Rash    Social History   Social History  . Marital Status: Widowed    Spouse Name: N/A  . Number of Children: 1  . Years of Education: 11th    Occupational History  .  Social History Main Topics  . Smoking status: Former Smoker -- 3.00 packs/day for 30 years    Types: Cigarettes    Quit date: 07/24/1980  . Smokeless tobacco: Never Used  . Alcohol Use: No  . Drug Use: No  . Sexual Activity: No   Other Topics Concern  . Not on file   Social History Narrative   Patient lives at home with her grandson and caregiver.   Caffeine use: 1 cup daily   Physical Exam: Filed Vitals:   05/03/15 1258  BP: 128/74  Pulse: 64  Height:  (1.626 m)  Weight: 165 lb 12.8 oz (75.206 kg)    GEN- The patient is elderly appearing, alert and oriented x 3 today.   Head- normocephalic, atraumatic Eyes-  Sclera clear, conjunctiva pink , R eye blindness persists, she does not have temporal artery tenderness today Ears- hearing intact Oropharynx- clear Neck- supple,  Lungs- Clear to ausculation  bilaterally, normal work of breathing Chest- pacemaker pocket is well healed Heart- Regular rate and rhythm, no murmurs, rubs or gallops, PMI not laterally displaced GI- soft, NT, ND, + BS Extremities- no clubbing, cyanosis, or edema MS- walks slowly with a walker  Pacemaker interrogation reviewed, see epic  Assessment and Plan:   1.  Complete heart block Normal pacemaker function See Pace Art report No changes today  2. HTN Stable No change required today  I had a long discussion with patient and family today.  She has a living will and has been clear that she is DNI/DNR.  Given her advanced age and dementia, I feel that this is very appropriate.  [facility] every 3 months Return to see EP NP in 1 year  Hillis Range MD, [facility] 05/03/2015 1:19 PM

## 2015-05-03 NOTE — Patient Instructions (Signed)
Medication Instructions:  Your physician recommends that you continue on your current medications as directed. Please refer to the Current Medication list given to you today.  Labwork: None ordered  Testing/Procedures: None ordered  Follow-Up: Remote monitoring is used to monitor your Pacemaker of ICD from home. This monitoring reduces the number of office visits required to check your device to one time per year. It allows us to keep an eye on the functioning of your device to ensure it is working properly. You are scheduled for a device check from home on 08/02/2015. You may send your transmission at any time that day. If you have a wireless device, the transmission will be sent automatically. After your physician reviews your transmission, you will receive a postcard with your next transmission date.  Your physician wants you to follow-up in: 1 year with Amber Seiler, NP.  You will receive a reminder letter in the mail two months in advance. If you don't receive a letter, please call our office to schedule the follow-up appointment.  Any Other Special Instructions Will Be Listed Below (If Applicable). Thank you for choosing [facility]!!         

## 2015-08-02 ENCOUNTER — Ambulatory Visit (INDEPENDENT_AMBULATORY_CARE_PROVIDER_SITE_OTHER): Payer: Medicare Other | Admitting: *Deleted

## 2015-08-02 DIAGNOSIS — I495 Sick sinus syndrome: Secondary | ICD-10-CM | POA: Diagnosis not present

## 2015-08-03 NOTE — Progress Notes (Signed)
Remote pacemaker transmission.   

## 2015-08-15 LAB — CUP PACEART REMOTE DEVICE CHECK
Battery Impedance: 488 Ohm
Battery Remaining Longevity: 79 mo
Battery Voltage: 2.77 V
Brady Statistic AP VP Percent: 98 %
Brady Statistic AP VS Percent: 0 %
Date Time Interrogation Session: 20170109154810
Implantable Lead Location: 753860
Implantable Lead Model: 5076
Implantable Lead Model: 5092
Lead Channel Impedance Value: 680 Ohm
Lead Channel Pacing Threshold Amplitude: 0.5 V
Lead Channel Pacing Threshold Pulse Width: 0.4 ms
Lead Channel Setting Pacing Pulse Width: 0.4 ms
Lead Channel Setting Sensing Sensitivity: 2.8 mV
MDC IDC LEAD IMPLANT DT: 20040416
MDC IDC LEAD IMPLANT DT: 20040416
MDC IDC LEAD LOCATION: 753859
MDC IDC MSMT LEADCHNL RA IMPEDANCE VALUE: 456 Ohm
MDC IDC MSMT LEADCHNL RA PACING THRESHOLD PULSEWIDTH: 0.4 ms
MDC IDC MSMT LEADCHNL RV PACING THRESHOLD AMPLITUDE: 0.875 V
MDC IDC SET LEADCHNL RA PACING AMPLITUDE: 2 V
MDC IDC SET LEADCHNL RV PACING AMPLITUDE: 2.5 V
MDC IDC STAT BRADY AS VP PERCENT: 2 %
MDC IDC STAT BRADY AS VS PERCENT: 0 %

## 2015-08-20 ENCOUNTER — Encounter: Payer: Self-pay | Admitting: Cardiology

## 2015-11-01 ENCOUNTER — Encounter: Payer: Medicare Other | Admitting: *Deleted

## 2015-11-01 ENCOUNTER — Telehealth: Payer: Self-pay | Admitting: Cardiology

## 2015-11-01 NOTE — Telephone Encounter (Signed)
Confirmed remote transmission w/ pt caregiver.   

## 2015-11-08 ENCOUNTER — Encounter: Payer: Self-pay | Admitting: Cardiology

## 2015-11-16 ENCOUNTER — Ambulatory Visit (INDEPENDENT_AMBULATORY_CARE_PROVIDER_SITE_OTHER): Payer: Medicare Other | Admitting: *Deleted

## 2015-11-16 DIAGNOSIS — I495 Sick sinus syndrome: Secondary | ICD-10-CM

## 2015-11-16 NOTE — Progress Notes (Signed)
Remote pacemaker transmission.   

## 2015-11-30 ENCOUNTER — Telehealth: Payer: Self-pay | Admitting: Internal Medicine

## 2015-11-30 NOTE — Telephone Encounter (Signed)
[name]( Caregiver) they have been trying to send a transmission and it is not going thru . Please call   Thanks

## 2015-12-01 NOTE — Telephone Encounter (Signed)
Spoke w/ pt caregiver and informed her that we did received pt transmission. Pt verbalized understanding.

## 2015-12-28 LAB — CUP PACEART REMOTE DEVICE CHECK
Battery Impedance: 540 Ohm
Battery Remaining Longevity: 76 mo
Battery Voltage: 2.77 V
Brady Statistic AP VP Percent: 96 %
Date Time Interrogation Session: 20170425122300
Implantable Lead Implant Date: 20040416
Implantable Lead Location: 753859
Implantable Lead Location: 753860
Implantable Lead Model: 5092
Lead Channel Impedance Value: 450 Ohm
Lead Channel Pacing Threshold Pulse Width: 0.4 ms
Lead Channel Pacing Threshold Pulse Width: 0.4 ms
Lead Channel Setting Pacing Amplitude: 2 V
Lead Channel Setting Pacing Amplitude: 2.5 V
Lead Channel Setting Pacing Pulse Width: 0.4 ms
MDC IDC LEAD IMPLANT DT: 20040416
MDC IDC MSMT LEADCHNL RA PACING THRESHOLD AMPLITUDE: 0.5 V
MDC IDC MSMT LEADCHNL RV IMPEDANCE VALUE: 698 Ohm
MDC IDC MSMT LEADCHNL RV PACING THRESHOLD AMPLITUDE: 0.875 V
MDC IDC SET LEADCHNL RV SENSING SENSITIVITY: 2.8 mV
MDC IDC STAT BRADY AP VS PERCENT: 0 %
MDC IDC STAT BRADY AS VP PERCENT: 3 %
MDC IDC STAT BRADY AS VS PERCENT: 0 %

## 2015-12-29 ENCOUNTER — Encounter: Payer: Self-pay | Admitting: Cardiology

## 2016-01-22 IMAGING — CR DG CHEST 1V PORT
1 series · 1 of 1 positions shown · non-contrast
Comparison: DG CHEST 1 VIEW dated 07/01/2011

CLINICAL DATA: Fever and weakness

EXAM:
PORTABLE CHEST - 1 VIEW

[AP]
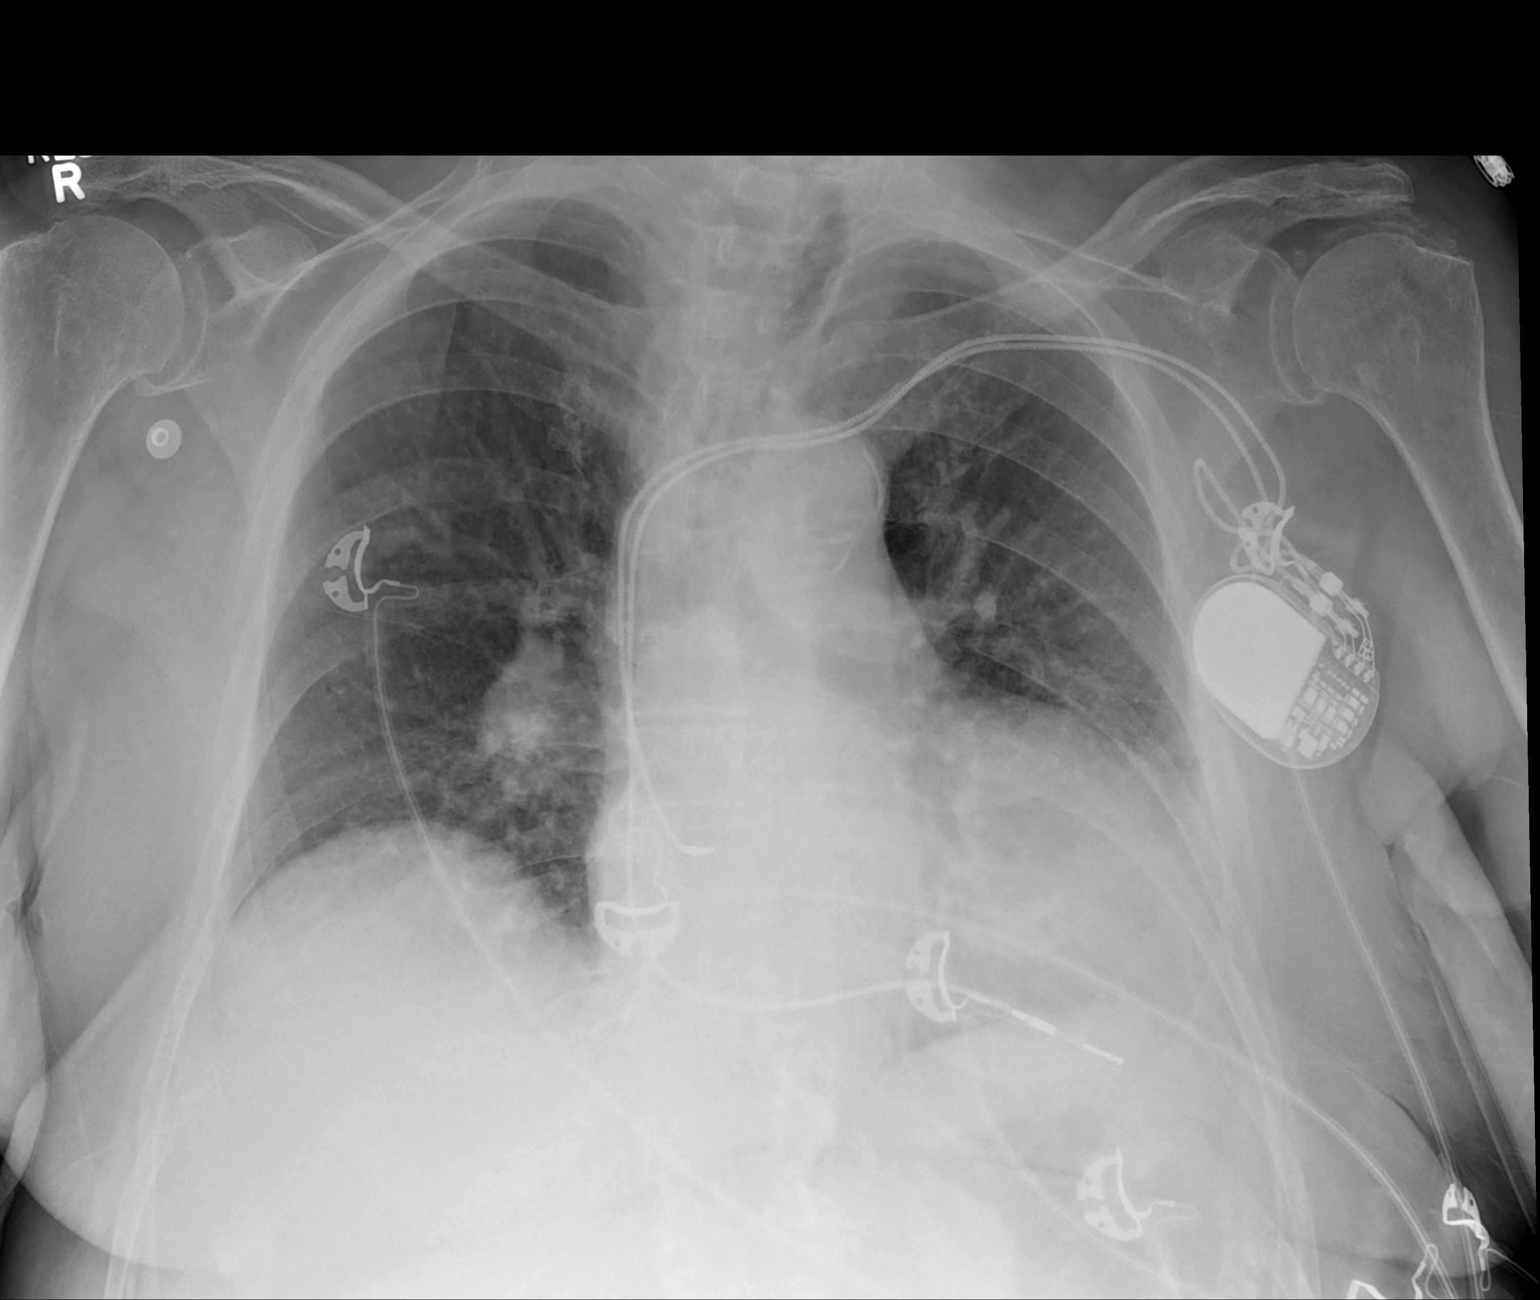

[1 of 1 positions shown; findings below may reference images not displayed]

FINDINGS: Low lung volumes with crowding of the central pulmonary markings.
There is no focal parenchymal opacity, pleural effusion, or
pneumothorax. The heart and mediastinal contours are unremarkable.
There is a dual lead cardiac pacer in satisfactory position.Stable
cardiomediastinal silhouette. Mild cardiomegaly.
IMPRESSION: No active disease.

## 2016-02-15 ENCOUNTER — Telehealth: Payer: Self-pay | Admitting: Cardiology

## 2016-02-15 ENCOUNTER — Ambulatory Visit (INDEPENDENT_AMBULATORY_CARE_PROVIDER_SITE_OTHER): Payer: Medicare Other | Admitting: *Deleted

## 2016-02-15 DIAGNOSIS — I495 Sick sinus syndrome: Secondary | ICD-10-CM | POA: Diagnosis not present

## 2016-02-15 LAB — CUP PACEART REMOTE DEVICE CHECK
Battery Impedance: 591 Ohm
Battery Remaining Longevity: 73 mo
Battery Voltage: 2.76 V
Brady Statistic AP VP Percent: 96 %
Brady Statistic AP VS Percent: 0 %
Brady Statistic AS VP Percent: 4 %
Brady Statistic AS VP Percent: 4 %
Brady Statistic AS VS Percent: 0 %
Brady Statistic AS VS Percent: 0 %
Implantable Lead Implant Date: 20040416
Implantable Lead Implant Date: 20040416
Implantable Lead Implant Date: 20040416
Implantable Lead Location: 753859
Implantable Lead Location: 753859
Implantable Lead Location: 753860
Implantable Lead Model: 5076
Implantable Lead Model: 5092
Lead Channel Impedance Value: 444 Ohm
Lead Channel Impedance Value: 714 Ohm
Lead Channel Impedance Value: 714 Ohm
Lead Channel Pacing Threshold Amplitude: 0.5 V
Lead Channel Pacing Threshold Amplitude: 0.5 V
Lead Channel Pacing Threshold Amplitude: 1.125 V
Lead Channel Pacing Threshold Amplitude: 1.125 V
Lead Channel Pacing Threshold Pulse Width: 0.4 ms
Lead Channel Pacing Threshold Pulse Width: 0.4 ms
Lead Channel Setting Pacing Amplitude: 2 V
Lead Channel Setting Pacing Amplitude: 2.5 V
Lead Channel Setting Pacing Pulse Width: 0.4 ms
Lead Channel Setting Sensing Sensitivity: 2.8 mV
MDC IDC LEAD IMPLANT DT: 20040416
MDC IDC LEAD LOCATION: 753860
MDC IDC MSMT BATTERY IMPEDANCE: 591 Ohm
MDC IDC MSMT BATTERY REMAINING LONGEVITY: 73 mo
MDC IDC MSMT BATTERY VOLTAGE: 2.76 V
MDC IDC MSMT LEADCHNL RA IMPEDANCE VALUE: 444 Ohm
MDC IDC MSMT LEADCHNL RA PACING THRESHOLD PULSEWIDTH: 0.4 ms
MDC IDC MSMT LEADCHNL RV PACING THRESHOLD PULSEWIDTH: 0.4 ms
MDC IDC SESS DTM: 20170726123506
MDC IDC SESS DTM: 20170726123506
MDC IDC SET LEADCHNL RA PACING AMPLITUDE: 2 V
MDC IDC SET LEADCHNL RV PACING AMPLITUDE: 2.5 V
MDC IDC SET LEADCHNL RV PACING PULSEWIDTH: 0.4 ms
MDC IDC SET LEADCHNL RV SENSING SENSITIVITY: 2.8 mV
MDC IDC STAT BRADY AP VP PERCENT: 96 %
MDC IDC STAT BRADY AP VS PERCENT: 0 %

## 2016-02-15 NOTE — Telephone Encounter (Signed)
Spoke with pt and reminded pt of remote transmission that is due today. Pt verbalized understanding.   

## 2016-02-16 ENCOUNTER — Telehealth: Payer: Self-pay | Admitting: Internal Medicine

## 2016-02-16 NOTE — Telephone Encounter (Signed)
New Message Pt calling about remote transmission for 7.21.17. Wanting to know if we've received the transmission. Please follow-up with pt. wy

## 2016-02-18 NOTE — Progress Notes (Signed)
Remote pacemaker transmission.   

## 2016-02-21 ENCOUNTER — Encounter: Payer: Self-pay | Admitting: Cardiology

## 2016-04-14 ENCOUNTER — Encounter: Payer: Self-pay | Admitting: Nurse Practitioner

## 2016-05-02 NOTE — Progress Notes (Signed)
Electrophysiology Office Note Date: 05/03/2016  ID:  [name], DOB April 07, 1917, MRN [id number]  PCP: [name], MD Electrophysiologist: Johney Frame  CC: Pacemaker follow-up  [name] is a 82 y.o. female seen today for Dr Johney Frame.  She presents today for routine electrophysiology followup.  Since last being seen in our clinic, the patient reports doing reasonably well.  She has had some increased shortness of breath and LE edema.She still lives at home but has around the clock care from her family.  She denies chest pain, palpitations, PND, orthopnea, nausea, vomiting, dizziness, syncope,  weight gain, or early satiety.  Device History: MDT dual chamber PPM implanted 2004 for complete heart block, gen change 2011   Past Medical History:  Diagnosis Date  . Complete heart block [facility])    s/p PPM by Dr Amil Amen 2004, recent Gen Change  07/13/10  . Depression   . DVT (deep venous thrombosis) (HCC)   . GERD (gastroesophageal reflux disease)   . Gout   . Hypertension   . Hypokalemia   . Recurrent UTI   . Small bowel obstruction    Past Surgical History:  Procedure Laterality Date  . HIP ARTHROPLASTY  07/02/2011   Procedure: ARTHROPLASTY BIPOLAR HIP;  Surgeon: Harvie Junior;  Location: [facility];  Service: Orthopedics;  Laterality: Left;  . INSERT / REPLACE / REMOVE PACEMAKER     initial pacemaker by Dr Amil Amen 2004, replaced by [facility] for ERI 07/13/10    Current Outpatient Prescriptions  Medication Sig Dispense Refill  . acetaminophen (TYLENOL) 325 MG tablet Take 650 mg by mouth every 4 (four) hours as needed. For pain.     Marland Kitchen allopurinol (ZYLOPRIM) 100 MG tablet Take 100-200 mg by mouth daily.     Marland Kitchen ALPRAZolam (XANAX) 0.5 MG tablet Take 0.5 tablets (0.25 mg total) by mouth 2 (two) times daily as needed for anxiety. 10 tablet 0  . beta carotene w/minerals (OCUVITE) tablet Take 1 tablet by mouth daily.    Marland Kitchen diltiazem (DILACOR XR) 180 MG 24 hr capsule Take 180 mg by mouth  daily.      Marland Kitchen ENSURE (ENSURE) Take 237 mLs by mouth 2 (two) times daily.      . folic acid (FOLVITE) 1 MG tablet Take 1 mg by mouth daily.      Marland Kitchen levothyroxine (SYNTHROID, LEVOTHROID) 112 MCG tablet Take 112 mcg by mouth daily before breakfast.    . metoprolol tartrate (LOPRESSOR) 25 MG tablet Take 12.5 mg by mouth 2 (two) times daily. Take half tablet 12.5mg  2 times daily  3  . pantoprazole (PROTONIX) 40 MG tablet Take 40 mg by mouth daily.    Marland Kitchen saccharomyces boulardii (FLORASTOR) 250 MG capsule Take 1 capsule (250 mg total) by mouth 2 (two) times daily. 60 capsule 0  . VESICARE 5 MG tablet Take 1 tablet by mouth daily.    . furosemide (LASIX) 20 MG tablet Take 1 tablet (20 mg total) by mouth daily. 30 tablet 5   No current facility-administered medications for this visit.     Allergies:   Aspirin; Ativan [lorazepam]; and Penicillins   Social History: Social History   Social History  . Marital status: Widowed    Spouse name: N/A  . Number of children: 1  . Years of education: 11th    Occupational History  .  Retired   Social History Main Topics  . Smoking status: Former Smoker    Packs/day: 3.00    Years: 30.00  Types: Cigarettes    Quit date: 07/24/1980  . Smokeless tobacco: Never Used  . Alcohol use No  . Drug use: No  . Sexual activity: No   Other Topics Concern  . Not on file   Social History Narrative   Patient lives at home with her grandson and caregiver.   Caffeine use: 1 cup daily    Family History: Family History  Problem Relation Age of Onset  . Stroke Father      Review of Systems: All other systems reviewed and are otherwise negative except as noted above.   Physical Exam: VS:  BP 124/64   Pulse 80   Ht 5\' 4"  (1.626 m)   Wt 164 lb 12.8 oz (74.8 kg)   SpO2 97%   BMI 28.29 kg/m  , BMI Body mass index is 28.29 kg/m.  GEN- The patient is elderly appearing, +HOH HEENT: normocephalic, atraumatic; sclera clear, conjunctiva pink; hearing  intact; oropharynx clear; neck supple Lungs- Clear to ausculation bilaterally, normal work of breathing.  No wheezes, rales, rhonchi Heart- Regular rate and rhythm (paced) GI- soft, non-tender, non-distended, bowel sounds present Extremities- no clubbing, cyanosis, 1+ BLE edema MS- no significant deformity or atrophy Skin- warm and dry, no rash or lesion; PPM pocket well healed Psych- euthymic mood, full affect Neuro- strength and sensation are intact  PPM Interrogation- reviewed in detail today,  See PACEART report  EKG:  EKG is not ordered today.  Recent Labs: No results found for requested labs within last 8760 hours.   Wt Readings from Last 3 Encounters:  05/03/16 164 lb 12.8 oz (74.8 kg)  05/03/15 165 lb 12.8 oz (75.2 kg)  01/05/15 162 lb 9.6 oz (73.8 kg)     Other studies Reviewed: Additional studies/ records that were reviewed today include: Dr Jenel LucksAllred's office notes  Assessment and Plan:  1.  Complete heart block  Normal PPM function See Pace Art report No changes today  2.  HTN Stable No change required today  3.  Shortness of breath  Likely multi-factorial I have discussed with the family testing today to further evaluate cause but they are clear in their decision to decline. With advanced age, I think this is very reasonable Will check BMET, BNP today and start Lasix 20mg  daily.  She has follow up with her PCP in 2 weeks and I have asked that they have her BMP rechecked at that time.     Current medicines are reviewed at length with the patient today.   The patient does not have concerns regarding her medicines.  The following changes were made today:  Start Lasix 20mg  daily  Labs/ tests ordered today include: BMET, BNP   Disposition:   Follow up with [facility], Dr Johney FrameAllred 1 year    Signed, Gypsy BalsamAmber Zayven Powe, NP 05/03/2016 12:02 PM  [facility] [address] [place] [address] [phone] (office) [phone] (fax)

## 2016-05-03 ENCOUNTER — Encounter (INDEPENDENT_AMBULATORY_CARE_PROVIDER_SITE_OTHER): Payer: Self-pay

## 2016-05-03 ENCOUNTER — Encounter: Payer: Self-pay | Admitting: Internal Medicine

## 2016-05-03 ENCOUNTER — Ambulatory Visit (INDEPENDENT_AMBULATORY_CARE_PROVIDER_SITE_OTHER): Payer: Medicare Other | Admitting: Nurse Practitioner

## 2016-05-03 VITALS — BP 124/64 | HR 80 | Ht 64.0 in | Wt 164.8 lb

## 2016-05-03 DIAGNOSIS — I442 Atrioventricular block, complete: Secondary | ICD-10-CM

## 2016-05-03 DIAGNOSIS — R0602 Shortness of breath: Secondary | ICD-10-CM

## 2016-05-03 DIAGNOSIS — I10 Essential (primary) hypertension: Secondary | ICD-10-CM | POA: Diagnosis not present

## 2016-05-03 LAB — CUP PACEART INCLINIC DEVICE CHECK
Date Time Interrogation Session: 20171011120238
Implantable Lead Implant Date: 20040416
Implantable Lead Location: 753859
Implantable Lead Model: 5076
MDC IDC LEAD IMPLANT DT: 20040416
MDC IDC LEAD LOCATION: 753860

## 2016-05-03 LAB — BASIC METABOLIC PANEL
BUN: 18 mg/dL (ref 7–25)
CHLORIDE: 105 mmol/L (ref 98–110)
CO2: 26 mmol/L (ref 20–31)
Calcium: 9.2 mg/dL (ref 8.6–10.4)
Creat: 0.82 mg/dL (ref 0.60–0.88)
Glucose, Bld: 104 mg/dL — ABNORMAL HIGH (ref 65–99)
POTASSIUM: 4.2 mmol/L (ref 3.5–5.3)
Sodium: 140 mmol/L (ref 135–146)

## 2016-05-03 LAB — BRAIN NATRIURETIC PEPTIDE: Brain Natriuretic Peptide: 97.5 pg/mL (ref <100)

## 2016-05-03 MED ORDER — FUROSEMIDE 20 MG PO TABS: 20.0000 mg | ORAL_TABLET | Freq: Every day | ORAL | 5 refills | Status: DC

## 2016-05-03 NOTE — Patient Instructions (Addendum)
Medication Instructions:   START TAKING LASIX 20 MG ONCE A DAY   If you need a refill on your cardiac medications before your next appointment, please call your pharmacy.  Labwork: BMET AND BNP TODAY    Testing/Procedures: NONE ORDER TODAY    Follow-Up: Your physician wants you to follow-up in: ONE YEAR WITH  ALLRED You will receive a reminder letter in the mail two months in advance. If you don't receive a letter, please call our office to schedule the follow-up appointment.  Remote monitoring is used to monitor your Pacemaker of ICD from home. This monitoring reduces the number of office visits required to check your device to one time per year. It allows us to keep an eye on the functioning of your device to ensure it is working properly. You are scheduled for a device check from home on .  1/11/18You may send your transmission at any time that day. If you have a wireless device, the transmission will be sent automatically. After your physician reviews your transmission, you will receive a postcard with your next transmission date.     Any Other Special Instructions Will Be Listed Below (If Applicable).

## 2016-05-08 ENCOUNTER — Telehealth: Payer: Self-pay | Admitting: *Deleted

## 2016-05-08 NOTE — Telephone Encounter (Signed)
-----   Message from Marily LenteAmber K Seiler, NP sent at 05/03/2016  4:10 PM EDT ----- Labs stable. She should have BMET repeated with PCP in a couple of weeks after being on Lasix.  If she doesn't find that Lasix helps, ok to stop

## 2016-05-08 NOTE — Telephone Encounter (Signed)
LMOVM TO CALL BACK OFFICE FOR LAB RESULTS

## 2016-08-02 ENCOUNTER — Ambulatory Visit (INDEPENDENT_AMBULATORY_CARE_PROVIDER_SITE_OTHER): Payer: Medicare Other | Admitting: *Deleted

## 2016-08-02 ENCOUNTER — Telehealth: Payer: Self-pay | Admitting: Cardiology

## 2016-08-02 DIAGNOSIS — I442 Atrioventricular block, complete: Secondary | ICD-10-CM

## 2016-08-02 NOTE — Telephone Encounter (Signed)
Confirmed remote transmission w/ pt daughter.   

## 2016-08-02 NOTE — Progress Notes (Signed)
Remote pacemaker transmission.   

## 2016-08-03 ENCOUNTER — Encounter: Payer: Self-pay | Admitting: Cardiology

## 2016-08-03 LAB — CUP PACEART REMOTE DEVICE CHECK
Battery Voltage: 2.77 V
Brady Statistic AS VP Percent: 2 %
Brady Statistic AS VS Percent: 0 %
Date Time Interrogation Session: 20180110151636
Implantable Lead Implant Date: 20040416
Implantable Lead Location: 753859
Implantable Pulse Generator Implant Date: 20111221
Lead Channel Impedance Value: 686 Ohm
Lead Channel Pacing Threshold Amplitude: 0.5 V
Lead Channel Pacing Threshold Amplitude: 0.875 V
Lead Channel Pacing Threshold Pulse Width: 0.4 ms
Lead Channel Pacing Threshold Pulse Width: 0.4 ms
Lead Channel Setting Pacing Amplitude: 2 V
Lead Channel Setting Pacing Pulse Width: 0.4 ms
Lead Channel Setting Sensing Sensitivity: 2.8 mV
MDC IDC LEAD IMPLANT DT: 20040416
MDC IDC LEAD LOCATION: 753860
MDC IDC MSMT BATTERY IMPEDANCE: 746 Ohm
MDC IDC MSMT BATTERY REMAINING LONGEVITY: 64 mo
MDC IDC MSMT LEADCHNL RA IMPEDANCE VALUE: 444 Ohm
MDC IDC SET LEADCHNL RV PACING AMPLITUDE: 2.5 V
MDC IDC STAT BRADY AP VP PERCENT: 97 %
MDC IDC STAT BRADY AP VS PERCENT: 0 %

## 2016-08-24 ENCOUNTER — Telehealth: Payer: Self-pay | Admitting: Internal Medicine

## 2016-08-24 NOTE — Telephone Encounter (Signed)
New Message    Per Daughter [name] you sent them medical records that says Pt is under 24 hour family care, but she can not have help, if she does it voids her insurance. They need you to change this on the medical record so that she can keep her insurance.   If before 130p [phone] [facility] M[facility]

## 2016-09-25 ENCOUNTER — Other Ambulatory Visit: Payer: Self-pay | Admitting: Nurse Practitioner

## 2016-11-02 ENCOUNTER — Ambulatory Visit (INDEPENDENT_AMBULATORY_CARE_PROVIDER_SITE_OTHER): Payer: Medicare Other | Admitting: *Deleted

## 2016-11-02 DIAGNOSIS — I442 Atrioventricular block, complete: Secondary | ICD-10-CM

## 2016-11-02 NOTE — Progress Notes (Signed)
Remote pacemaker transmission.   

## 2016-11-03 ENCOUNTER — Encounter: Payer: Self-pay | Admitting: Cardiology

## 2016-11-03 LAB — CUP PACEART REMOTE DEVICE CHECK
Battery Impedance: 853 Ohm
Brady Statistic AP VP Percent: 97 %
Brady Statistic AP VS Percent: 0 %
Brady Statistic AS VP Percent: 3 %
Brady Statistic AS VS Percent: 0 %
Date Time Interrogation Session: 20180412151903
Implantable Lead Implant Date: 20040416
Implantable Lead Implant Date: 20040416
Implantable Lead Location: 753859
Implantable Lead Model: 5076
Implantable Lead Model: 5092
Implantable Pulse Generator Implant Date: 20111221
Lead Channel Impedance Value: 444 Ohm
Lead Channel Impedance Value: 727 Ohm
Lead Channel Pacing Threshold Amplitude: 0.5 V
Lead Channel Pacing Threshold Amplitude: 1 V
Lead Channel Pacing Threshold Pulse Width: 0.4 ms
Lead Channel Setting Pacing Amplitude: 2 V
Lead Channel Setting Pacing Amplitude: 2.5 V
MDC IDC LEAD LOCATION: 753860
MDC IDC MSMT BATTERY REMAINING LONGEVITY: 61 mo
MDC IDC MSMT BATTERY VOLTAGE: 2.76 V
MDC IDC MSMT LEADCHNL RV PACING THRESHOLD PULSEWIDTH: 0.4 ms
MDC IDC SET LEADCHNL RV PACING PULSEWIDTH: 0.4 ms
MDC IDC SET LEADCHNL RV SENSING SENSITIVITY: 2.8 mV

## 2017-02-01 ENCOUNTER — Ambulatory Visit (INDEPENDENT_AMBULATORY_CARE_PROVIDER_SITE_OTHER): Payer: Medicare Other | Admitting: *Deleted

## 2017-02-01 DIAGNOSIS — I442 Atrioventricular block, complete: Secondary | ICD-10-CM | POA: Diagnosis not present

## 2017-02-01 NOTE — Progress Notes (Signed)
Remote pacemaker transmission.   

## 2017-02-06 ENCOUNTER — Encounter: Payer: Self-pay | Admitting: Cardiology

## 2017-02-24 LAB — CUP PACEART REMOTE DEVICE CHECK
Battery Impedance: 906 Ohm
Battery Remaining Longevity: 58 mo
Battery Voltage: 2.75 V
Brady Statistic AP VP Percent: 96 %
Brady Statistic AP VS Percent: 0 %
Brady Statistic AS VP Percent: 3 %
Brady Statistic AS VS Percent: 0 %
Implantable Lead Implant Date: 20040416
Implantable Lead Location: 753859
Implantable Lead Model: 5076
Implantable Lead Model: 5092
Implantable Pulse Generator Implant Date: 20111221
Lead Channel Impedance Value: 444 Ohm
Lead Channel Pacing Threshold Amplitude: 0.5 V
Lead Channel Pacing Threshold Pulse Width: 0.4 ms
Lead Channel Setting Pacing Amplitude: 2 V
Lead Channel Setting Pacing Amplitude: 2.5 V
Lead Channel Setting Pacing Pulse Width: 0.4 ms
Lead Channel Setting Sensing Sensitivity: 2.8 mV
MDC IDC LEAD IMPLANT DT: 20040416
MDC IDC LEAD LOCATION: 753860
MDC IDC MSMT LEADCHNL RA PACING THRESHOLD PULSEWIDTH: 0.4 ms
MDC IDC MSMT LEADCHNL RV IMPEDANCE VALUE: 700 Ohm
MDC IDC MSMT LEADCHNL RV PACING THRESHOLD AMPLITUDE: 1 V
MDC IDC SESS DTM: 20180712122457

## 2017-05-03 ENCOUNTER — Ambulatory Visit (INDEPENDENT_AMBULATORY_CARE_PROVIDER_SITE_OTHER): Payer: Medicare Other | Admitting: *Deleted

## 2017-05-03 DIAGNOSIS — I442 Atrioventricular block, complete: Secondary | ICD-10-CM

## 2017-05-03 NOTE — Progress Notes (Signed)
Remote pacemaker transmission.   

## 2017-05-04 ENCOUNTER — Encounter: Payer: Self-pay | Admitting: Cardiology

## 2017-05-11 LAB — CUP PACEART REMOTE DEVICE CHECK
Battery Impedance: 1013 Ohm
Battery Remaining Longevity: 54 mo
Battery Voltage: 2.76 V
Brady Statistic AP VP Percent: 95 %
Brady Statistic AS VP Percent: 4 %
Implantable Lead Implant Date: 20040416
Implantable Lead Location: 753859
Implantable Lead Model: 5092
Implantable Pulse Generator Implant Date: 20111221
Lead Channel Impedance Value: 437 Ohm
Lead Channel Pacing Threshold Amplitude: 0.5 V
Lead Channel Pacing Threshold Amplitude: 0.875 V
Lead Channel Setting Pacing Amplitude: 2 V
Lead Channel Setting Pacing Amplitude: 2.5 V
Lead Channel Setting Pacing Pulse Width: 0.4 ms
MDC IDC LEAD IMPLANT DT: 20040416
MDC IDC LEAD LOCATION: 753860
MDC IDC MSMT LEADCHNL RA PACING THRESHOLD PULSEWIDTH: 0.4 ms
MDC IDC MSMT LEADCHNL RV IMPEDANCE VALUE: 680 Ohm
MDC IDC MSMT LEADCHNL RV PACING THRESHOLD PULSEWIDTH: 0.4 ms
MDC IDC SESS DTM: 20181011142058
MDC IDC SET LEADCHNL RV SENSING SENSITIVITY: 2.8 mV
MDC IDC STAT BRADY AP VS PERCENT: 1 %
MDC IDC STAT BRADY AS VS PERCENT: 0 %

## 2017-06-26 ENCOUNTER — Encounter (HOSPITAL_COMMUNITY): Payer: Self-pay | Admitting: Internal Medicine

## 2017-06-26 ENCOUNTER — Inpatient Hospital Stay (HOSPITAL_COMMUNITY)
Admission: EM | Admit: 2017-06-26 | Discharge: 2017-07-03 | DRG: 480 | Disposition: A | Discharge status: Skilled Nursing Facility | Payer: Medicare Other | Attending: Internal Medicine | Admitting: Internal Medicine

## 2017-06-26 ENCOUNTER — Emergency Department (HOSPITAL_COMMUNITY): Payer: Medicare Other

## 2017-06-26 DIAGNOSIS — I442 Atrioventricular block, complete: Secondary | ICD-10-CM | POA: Diagnosis not present

## 2017-06-26 DIAGNOSIS — W19XXXA Unspecified fall, initial encounter: Secondary | ICD-10-CM | POA: Diagnosis not present

## 2017-06-26 DIAGNOSIS — F419 Anxiety disorder, unspecified: Secondary | ICD-10-CM | POA: Diagnosis present

## 2017-06-26 DIAGNOSIS — Z79899 Other long term (current) drug therapy: Secondary | ICD-10-CM

## 2017-06-26 DIAGNOSIS — T148XXA Other injury of unspecified body region, initial encounter: Secondary | ICD-10-CM

## 2017-06-26 DIAGNOSIS — S7292XA Unspecified fracture of left femur, initial encounter for closed fracture: Secondary | ICD-10-CM | POA: Diagnosis not present

## 2017-06-26 DIAGNOSIS — E039 Hypothyroidism, unspecified: Secondary | ICD-10-CM | POA: Diagnosis not present

## 2017-06-26 DIAGNOSIS — Z86718 Personal history of other venous thrombosis and embolism: Secondary | ICD-10-CM

## 2017-06-26 DIAGNOSIS — E876 Hypokalemia: Secondary | ICD-10-CM | POA: Diagnosis present

## 2017-06-26 DIAGNOSIS — I252 Old myocardial infarction: Secondary | ICD-10-CM

## 2017-06-26 DIAGNOSIS — K5900 Constipation, unspecified: Secondary | ICD-10-CM | POA: Diagnosis not present

## 2017-06-26 DIAGNOSIS — I10 Essential (primary) hypertension: Secondary | ICD-10-CM | POA: Diagnosis present

## 2017-06-26 DIAGNOSIS — D62 Acute posthemorrhagic anemia: Secondary | ICD-10-CM | POA: Diagnosis not present

## 2017-06-26 DIAGNOSIS — S72302A Unspecified fracture of shaft of left femur, initial encounter for closed fracture: Secondary | ICD-10-CM | POA: Diagnosis not present

## 2017-06-26 DIAGNOSIS — F0390 Unspecified dementia without behavioral disturbance: Secondary | ICD-10-CM | POA: Diagnosis present

## 2017-06-26 DIAGNOSIS — Z950 Presence of cardiac pacemaker: Secondary | ICD-10-CM | POA: Diagnosis present

## 2017-06-26 DIAGNOSIS — M109 Gout, unspecified: Secondary | ICD-10-CM | POA: Diagnosis present

## 2017-06-26 DIAGNOSIS — F329 Major depressive disorder, single episode, unspecified: Secondary | ICD-10-CM | POA: Diagnosis present

## 2017-06-26 DIAGNOSIS — S72009A Fracture of unspecified part of neck of unspecified femur, initial encounter for closed fracture: Secondary | ICD-10-CM | POA: Diagnosis present

## 2017-06-26 DIAGNOSIS — G9341 Metabolic encephalopathy: Secondary | ICD-10-CM | POA: Diagnosis present

## 2017-06-26 DIAGNOSIS — Y92009 Unspecified place in unspecified non-institutional (private) residence as the place of occurrence of the external cause: Secondary | ICD-10-CM | POA: Diagnosis not present

## 2017-06-26 DIAGNOSIS — Z419 Encounter for procedure for purposes other than remedying health state, unspecified: Secondary | ICD-10-CM

## 2017-06-26 DIAGNOSIS — W06XXXA Fall from bed, initial encounter: Secondary | ICD-10-CM | POA: Diagnosis present

## 2017-06-26 DIAGNOSIS — E785 Hyperlipidemia, unspecified: Secondary | ICD-10-CM | POA: Diagnosis present

## 2017-06-26 DIAGNOSIS — K219 Gastro-esophageal reflux disease without esophagitis: Secondary | ICD-10-CM | POA: Diagnosis present

## 2017-06-26 DIAGNOSIS — Z87891 Personal history of nicotine dependence: Secondary | ICD-10-CM

## 2017-06-26 DIAGNOSIS — Z66 Do not resuscitate: Secondary | ICD-10-CM | POA: Diagnosis present

## 2017-06-26 DIAGNOSIS — H9190 Unspecified hearing loss, unspecified ear: Secondary | ICD-10-CM | POA: Diagnosis present

## 2017-06-26 DIAGNOSIS — I350 Nonrheumatic aortic (valve) stenosis: Secondary | ICD-10-CM | POA: Diagnosis present

## 2017-06-26 DIAGNOSIS — M9702XA Periprosthetic fracture around internal prosthetic left hip joint, initial encounter: Principal | ICD-10-CM | POA: Diagnosis present

## 2017-06-26 LAB — BASIC METABOLIC PANEL
Anion gap: 10 (ref 5–15)
BUN: 15 mg/dL (ref 6–20)
CHLORIDE: 104 mmol/L (ref 101–111)
CO2: 25 mmol/L (ref 22–32)
CREATININE: 0.88 mg/dL (ref 0.44–1.00)
Calcium: 8.6 mg/dL — ABNORMAL LOW (ref 8.9–10.3)
GFR calc Af Amer: >60 mL/min (ref >60)
GFR calc non Af Amer: 53 mL/min — ABNORMAL LOW (ref >60)
GLUCOSE: 131 mg/dL — AB (ref 65–99)
POTASSIUM: 3.3 mmol/L — AB (ref 3.5–5.1)
SODIUM: 139 mmol/L (ref 135–145)

## 2017-06-26 LAB — CBC WITH DIFFERENTIAL/PLATELET
Basophils Absolute: 0 10*3/uL (ref 0.0–0.1)
Basophils Relative: 0 %
EOS ABS: 0 10*3/uL (ref 0.0–0.7)
EOS PCT: 0 %
HCT: 38.7 % (ref 36.0–46.0)
Hemoglobin: 13 g/dL (ref 12.0–15.0)
LYMPHS ABS: 1.3 10*3/uL (ref 0.7–4.0)
LYMPHS PCT: 7 %
MCH: 29.3 pg (ref 26.0–34.0)
MCHC: 33.6 g/dL (ref 30.0–36.0)
MCV: 87.2 fL (ref 78.0–100.0)
MONO ABS: 0.9 10*3/uL (ref 0.1–1.0)
Monocytes Relative: 5 %
Neutro Abs: 15.6 10*3/uL — ABNORMAL HIGH (ref 1.7–7.7)
Neutrophils Relative %: 88 %
PLATELETS: 245 10*3/uL (ref 150–400)
RBC: 4.44 MIL/uL (ref 3.87–5.11)
RDW: 13 % (ref 11.5–15.5)
WBC: 17.8 10*3/uL — ABNORMAL HIGH (ref 4.0–10.5)

## 2017-06-26 LAB — PROTIME-INR
INR: 1.14
Prothrombin Time: 14.5 seconds (ref 11.4–15.2)

## 2017-06-26 LAB — TYPE AND SCREEN
ABO/RH(D): O POS
Antibody Screen: NEGATIVE

## 2017-06-26 LAB — CBG MONITORING, ED: GLUCOSE-CAPILLARY: 112 mg/dL — AB (ref 65–99)

## 2017-06-26 MED ORDER — SODIUM CHLORIDE 0.9 % IV SOLN
INTRAVENOUS | Status: DC
  Administered 2017-06-26 – 2017-06-29 (×2): via INTRAVENOUS

## 2017-06-26 MED ORDER — ENOXAPARIN SODIUM 40 MG/0.4ML ~~LOC~~ SOLN: 40.0000 mg | SUBCUTANEOUS | Status: DC

## 2017-06-26 MED ORDER — LEVOTHYROXINE SODIUM 112 MCG PO TABS
112.0000 ug | ORAL_TABLET | Freq: Every day | ORAL | Status: DC
  Administered 2017-06-29 – 2017-07-03 (×5): 112 ug via ORAL
  Filled 2017-06-26 (×6): qty 1

## 2017-06-26 MED ORDER — FENTANYL CITRATE (PF) 100 MCG/2ML IJ SOLN
100.0000 ug | Freq: Once | INTRAMUSCULAR | Status: AC
  Administered 2017-06-26: 100 ug via INTRAVENOUS
  Filled 2017-06-26: qty 2

## 2017-06-26 MED ORDER — POTASSIUM CHLORIDE 10 MEQ/100ML IV SOLN
10.0000 meq | INTRAVENOUS | Status: AC
  Administered 2017-06-27 (×2): 10 meq via INTRAVENOUS
  Filled 2017-06-26 (×2): qty 100

## 2017-06-26 MED ORDER — FUROSEMIDE 20 MG PO TABS
20.0000 mg | ORAL_TABLET | Freq: Every day | ORAL | Status: DC
  Administered 2017-06-27 – 2017-07-03 (×6): 20 mg via ORAL
  Filled 2017-06-26 (×6): qty 1

## 2017-06-26 MED ORDER — MORPHINE SULFATE (PF) 2 MG/ML IV SOLN
0.5000 mg | INTRAVENOUS | Status: DC | PRN
  Administered 2017-06-27 – 2017-07-02 (×8): 0.5 mg via INTRAVENOUS
  Filled 2017-06-26 (×10): qty 1

## 2017-06-26 MED ORDER — HYDROCODONE-ACETAMINOPHEN 5-325 MG PO TABS
1.0000 | ORAL_TABLET | Freq: Four times a day (QID) | ORAL | Status: DC | PRN
  Administered 2017-06-27: 2 via ORAL
  Administered 2017-06-28 (×2): 1 via ORAL
  Filled 2017-06-26: qty 2
  Filled 2017-06-26 (×2): qty 1

## 2017-06-26 MED ORDER — METOPROLOL TARTRATE 12.5 MG HALF TABLET
12.5000 mg | ORAL_TABLET | Freq: Two times a day (BID) | ORAL | Status: DC
  Administered 2017-06-27 – 2017-07-03 (×9): 12.5 mg via ORAL
  Filled 2017-06-26 (×13): qty 1

## 2017-06-26 MED ORDER — DILTIAZEM HCL ER COATED BEADS 180 MG PO CP24
180.0000 mg | ORAL_CAPSULE | Freq: Every day | ORAL | Status: DC
  Administered 2017-06-27 – 2017-07-03 (×6): 180 mg via ORAL
  Filled 2017-06-26 (×6): qty 1

## 2017-06-26 MED ORDER — FENTANYL CITRATE (PF) 100 MCG/2ML IJ SOLN
50.0000 ug | Freq: Once | INTRAMUSCULAR | Status: AC
  Administered 2017-06-26: 50 ug via INTRAVENOUS
  Filled 2017-06-26: qty 2

## 2017-06-26 MED ORDER — FOLIC ACID 1 MG PO TABS
1.0000 mg | ORAL_TABLET | Freq: Every day | ORAL | Status: DC
Start: 2017-06-27 — End: 2017-07-03
  Administered 2017-06-27 – 2017-07-03 (×6): 1 mg via ORAL
  Filled 2017-06-26 (×6): qty 1

## 2017-06-26 MED ORDER — ALLOPURINOL 100 MG PO TABS
100.0000 mg | ORAL_TABLET | Freq: Every day | ORAL | Status: DC
  Administered 2017-06-27 – 2017-07-03 (×6): 100 mg via ORAL
  Filled 2017-06-26 (×6): qty 1

## 2017-06-26 MED ORDER — ALPRAZOLAM 0.25 MG PO TABS
0.2500 mg | ORAL_TABLET | Freq: Two times a day (BID) | ORAL | Status: DC | PRN
  Administered 2017-06-27 – 2017-07-02 (×6): 0.25 mg via ORAL
  Filled 2017-06-26 (×7): qty 1

## 2017-06-26 MED ORDER — DARIFENACIN HYDROBROMIDE ER 7.5 MG PO TB24
7.5000 mg | ORAL_TABLET | Freq: Every day | ORAL | Status: DC
  Administered 2017-06-27 – 2017-07-03 (×6): 7.5 mg via ORAL
  Filled 2017-06-26 (×7): qty 1

## 2017-06-26 NOTE — H&P (Addendum)
History and Physical    [name] ZOX:[id number] DOB: 1917-05-06 DOA: 06/26/2017  Referring MD/NP/PA: Dr. Benjiman CoreNathan Pickering PCP: Barbie BannerWilson, Fred H, MD  Patient coming from: home via EMS  Chief Complaint: Fall  I have personally briefly reviewed patient's old medical records in [place] [facility]   HPI: [name] is a 83 y.o. female with medical history significant of HTN, HLD left hip arthritis s/p replacement, hypothyroidism, CHB s/p PM followed by Dr. Johney FrameAllred cardiology; who presents after having a fall at home or getting out of the bed at around 4:30 PM.  Patient normally ambulates with use of a walker and is cared for by her grandson who is also her POA.  She stated that her left leg just gave way.  Patient denies any significant loss of consciousness or trauma to her head and was unable to bear weight on the left leg following the fall and reported having excruciating pain.  Otherwise, patient has been in her normal state of health.  She previously had a left hip replacement performed by Dr. Jodi GeraldsJohn Graves back in 06/2011   ED Course: With EMS patient was given 100 mcg of fentanyl. On admission to the emergency department patient was noted to be afebrile, pulse 67-81, respiration 20-21, blood pressure 153/93-159/83, and O2 saturations maintained.  Labs revealed WBC 17.8 and potassium 3.3.  Chest x-ray was unremarkable.  X-rays of the left femur revealed moderately displaced left femoral shaft fracture near left hip prosthesis.  The ED physician discussed with Dr. Bevelyn Ngoonald Dorff who is covering for Dr. Luiz BlareGraves of orthopedics and wanted the patient transferred to [facility] for possible surgical intervention.  Family requests patient be evaluated by her cardiologist prior to surgery, if able.    Review of Systems  Constitutional: Negative for diaphoresis and malaise/fatigue.  HENT: Positive for hearing loss. Negative for congestion.   Eyes: Negative for pain and redness.  Respiratory:  Negative for sputum production and shortness of breath.   Cardiovascular: Positive for leg swelling. Negative for chest pain.  Gastrointestinal: Negative for abdominal pain, nausea and vomiting.  Genitourinary: Negative for dysuria and hematuria.  Musculoskeletal: Positive for falls and joint pain.  Skin: Negative for itching and rash.  Neurological: Negative for sensory change and loss of consciousness.  Psychiatric/Behavioral: Negative for substance abuse and suicidal ideas.    Past Medical History:  Diagnosis Date  . Complete heart block [facility](HCC)    s/p PPM by Dr Amil AmenEdmunds 2004, recent Gen Change  07/13/10  . Depression   . DVT (deep venous thrombosis) (HCC)   . GERD (gastroesophageal reflux disease)   . Gout   . Hypertension   . Hypokalemia   . Recurrent UTI   . Small bowel obstruction [facility](HCC)     Past Surgical History:  Procedure Laterality Date  . HIP ARTHROPLASTY  07/02/2011   Procedure: ARTHROPLASTY BIPOLAR HIP;  Surgeon: Harvie JuniorJohn L Graves;  Location: [facility];  Service: Orthopedics;  Laterality: Left;  . INSERT / REPLACE / REMOVE PACEMAKER     initial pacemaker by Dr Amil AmenEdmunds 2004, [facility] 07/13/10     reports that she quit smoking about 36 years ago. Her smoking use included cigarettes. She has a 90.00 pack-year smoking history. she has never used smokeless tobacco. She reports that she does not drink alcohol or use drugs.  Allergies  Allergen Reactions  . Aspirin     Prior history of stomach problems (? Ulcer), instructed to resume for stroke  .  Ativan [Lorazepam] Other (See Comments)    Has opposite effect "makes her crazy"  . Penicillins Rash    Family History  Problem Relation Age of Onset  . Stroke Father     Prior to Admission medications   Medication Sig Start Date End Date Taking? Authorizing Provider  acetaminophen (TYLENOL) 325 MG tablet Take 650 mg by mouth every 4 (four) hours as needed. For pain.    Yes [provider]  allopurinol  (ZYLOPRIM) 100 MG tablet Take 100-200 mg by mouth daily.    Yes [provider]  ALPRAZolam Prudy Feeler) 0.5 MG tablet Take 0.5 tablets (0.25 mg total) by mouth 2 (two) times daily as needed for anxiety. 01/14/15  Yes Alison Murray, MD  beta carotene w/minerals (OCUVITE) tablet Take 1 tablet by mouth daily.   Yes [provider]  diltiazem (DILACOR XR) 180 MG 24 hr capsule Take 180 mg by mouth daily.     Yes [provider]  ENSURE (ENSURE) Take 237 mLs by mouth 2 (two) times daily.     Yes [provider]  folic acid (FOLVITE) 1 MG tablet Take 1 mg by mouth daily.     Yes [provider]  furosemide (LASIX) 20 MG tablet TAKE 1 TABLET BY MOUTH  DAILY 09/26/16  Yes Seiler, [facility] K, NP  levothyroxine (SYNTHROID, LEVOTHROID) 112 MCG tablet Take 112 mcg by mouth daily before breakfast.   Yes [provider]  metoprolol tartrate (LOPRESSOR) 25 MG tablet Take 12.5 mg by mouth 2 (two) times daily. Take half tablet 12.5mg  2 times daily 04/14/15  Yes [provider]  VESICARE 5 MG tablet Take 1 tablet by mouth daily. 10/08/14  Yes [provider]  pantoprazole (PROTONIX) 40 MG tablet Take 40 mg by mouth daily. 04/28/15   [provider]  saccharomyces boulardii (FLORASTOR) 250 MG capsule Take 1 capsule (250 mg total) by mouth 2 (two) times daily. Patient not taking: Reported on 06/26/2017 01/14/15   Alison Murray, MD    Physical Exam:  Constitutional: Elderly female in mild discomfort Vitals:   06/26/17 1828 06/26/17 1832 06/26/17 2106  BP:  (!) 153/93 (!) 159/83  Pulse:  81 67  Resp:  20 (!) 21  Temp:  (!) 97.3 F (36.3 C)   TempSrc:  Axillary   SpO2: 100% 100% 100%   Eyes: PERRL, lids and conjunctivae normal ENMT: Mucous membranes are moist. Posterior pharynx clear of any exudate or lesions.   Patient hard of hearing. Neck: normal, supple, no masses, no thyromegaly Respiratory: clear to auscultation bilaterally, no  wheezing, no crackles. Normal respiratory effort. No accessory muscle use.  Cardiovascular: Regular rate and rhythm, no murmurs / rubs / gallops. No extremity edema. 2+ pedal pulses. No carotid bruits.  Abdomen: no tenderness, no masses palpated. No hepatosplenomegaly. Bowel sounds positive.  Musculoskeletal: Deformity of the left femur.  Patient unable to move leg due to pain. Skin: no rashes, lesions, ulcers. No induration Neurologic: CN 2-12 grossly intact. Sensation intact, DTR normal.  Patient able to move all extremities. Psychiatric: Normal judgment and insight. Alert and oriented x 3. Normal mood.     Labs on Admission: I have personally reviewed following labs and imaging studies  CBC: Recent Labs  Lab 06/26/17 1925  WBC 17.8*  NEUTROABS 15.6*  HGB 13.0  HCT 38.7  MCV 87.2  PLT 245   Basic Metabolic Panel: Recent Labs  Lab 06/26/17 1925  NA 139  K 3.3*  CL  104  CO2 25  GLUCOSE 131*  BUN 15  CREATININE 0.88  CALCIUM 8.6*   GFR: CrCl cannot be calculated (Unknown ideal weight.). Liver Function Tests: No results for input(s): AST, ALT, ALKPHOS, BILITOT, PROT, ALBUMIN in the last 168 hours. No results for input(s): LIPASE, AMYLASE in the last 168 hours. No results for input(s): AMMONIA in the last 168 hours. Coagulation Profile: Recent Labs  Lab 06/26/17 2119  INR 1.14   Cardiac Enzymes: No results for input(s): CKTOTAL, CKMB, CKMBINDEX, TROPONINI in the last 168 hours. BNP (last 3 results) No results for input(s): PROBNP in the last 8760 hours. HbA1C: No results for input(s): HGBA1C in the last 72 hours. CBG: Recent Labs  Lab 06/26/17 1842  GLUCAP 112*   Lipid Profile: No results for input(s): CHOL, HDL, LDLCALC, TRIG, CHOLHDL, LDLDIRECT in the last 72 hours. Thyroid Function Tests: No results for input(s): TSH, T4TOTAL, FREET4, T3FREE, THYROIDAB in the last 72 hours. Anemia Panel: No results for input(s): VITAMINB12, FOLATE, FERRITIN, TIBC,  IRON, RETICCTPCT in the last 72 hours. Urine analysis:    Component Value Date/Time   COLORURINE AMBER (A) 01/05/2015 1427   APPEARANCEUR CLOUDY (A) 01/05/2015 1427   LABSPEC 1.046 (H) 01/05/2015 1427   PHURINE 6.0 01/05/2015 1427   GLUCOSEU NEGATIVE 01/05/2015 1427   HGBUR NEGATIVE 01/05/2015 1427   BILIRUBINUR SMALL (A) 01/05/2015 1427   KETONESUR NEGATIVE 01/05/2015 1427   PROTEINUR NEGATIVE 01/05/2015 1427   UROBILINOGEN 1.0 01/05/2015 1427   NITRITE NEGATIVE 01/05/2015 1427   LEUKOCYTESUR SMALL (A) 01/05/2015 1427   Sepsis Labs: No results found for this or any previous visit (from the past 240 hour(s)).   Radiological Exams on Admission: Dg Chest 1 View  Result Date: 06/26/2017 CLINICAL DATA:  83 year old female presents after fall. Left leg pain and swelling. EXAM: CHEST 1 VIEW COMPARISON:  10/15/2013 CXR FINDINGS: The heart size and mediastinal contours are within normal limits. Aortic atherosclerosis at the arch without aneurysm. Left-sided pacemaker apparatus with right atrial and right ventricular leads are again noted without change. Both lungs are clear. The visualized skeletal structures are unremarkable. IMPRESSION: No active disease.  Aortic atherosclerosis. Electronically Signed   By: Tollie Ethavid  Kwon M.D.   On: 06/26/2017 19:41   Dg Femur Min 2 Views Left  Result Date: 06/26/2017 CLINICAL DATA:  Left leg pain after fall today. EXAM: LEFT FEMUR 2 VIEWS COMPARISON:  Radiograph of July 02, 2011. FINDINGS: Status post left hemiarthroplasty. Moderately displaced fracture of left femur is noted with slightly overriding fracture fragments. Fracture begins at distal tip of prosthesis. IMPRESSION: Moderately displaced left femoral shaft fracture is noted with slightly overriding fracture fragments. Proximal portion of fracture begins at distal tip of left hip prosthesis. Electronically Signed   By: Lupita RaiderJames  Green Jr, M.D.   On: 06/26/2017 19:40    EKG: Independently reviewed.  Dual  paced rhythm  Assessment/Plan Displaced left femur fracture 2/2 fall: Patient had a mechanical fall while getting out of bed.  X-rays reveal closed displaced left femoral shaft fracture near previous hip prosthesis.  Orthopedics consulted and will see patient in a.m. and wanted transfer to [facility]. - Ad[facility] at [facility] to a MedSurg bed - Hip fracture order set initiated - Morphine prn pain - Gentle IV fluids normal saline at 75 mL/h - Appreciate orthopedic consultative services, follow-up for further recommendations  Leukocytosis: Acute.  WBC elevated at 17.3 on admission suspect that this could be reactive to the acute fracture.  Chest x-ray  otherwise clear. - Follow-up urinalysis - Check CBC  Hypokalemia: Acute on chronic.  Initial potassium noted to be 3.3 on admission.  Patient previously noted to have issues with low potassium in the past. - Give 20 mEq of potassium chloride IV - Check magnesium level in a.m. and continue to replace potassium as needed  Complete heart block s/p PM: Patient followed by Dr. Johney Frame of cardiology.  She has a MDT dual chamber PPM implanted 2004 for complete heart block, gen change 2011. - Continue Cardizem  Essential hypertension - Continue Cardizem, metoprolol, furosemide,  Hypothyroidism - Continue levothyroxine  Gout: Patient without acute flare. - Continue allopurinol  Anxiety - Continue Xanax prn anxiety  DVT prophylaxis: lovenox  Code Status: DNR  Family Communication: Plan of care with patient grandson present at bedside Disposition Plan: TBD, likely need for rehab  Consults called: Orthopedics Admission status: Inpatient  Clydie Braun MD [facility] Pager [phone]   If 7PM-7AM, please contact night-coverage [email or web address] Password [email or web address]  06/26/2017, 10:31 PM

## 2017-06-26 NOTE — ED Notes (Signed)
Patient transported to X-ray 

## 2017-06-26 NOTE — ED Notes (Signed)
Cindy CoGrandson David [name] request contacted if change in condition or any questions arise to call him at [phone]

## 2017-06-26 NOTE — ED Notes (Signed)
Bed: WA23 Expected date:  Expected time:  Means of arrival:  Comments: EMS-hip fracture 

## 2017-06-26 NOTE — ED Provider Notes (Signed)
[facility]-EMERGENCY DEPT Provider Note   CSN: [id number] Arrival date & time: 06/26/17  1810     History   Chief Complaint Chief Complaint  Patient presents with  . Hip Injury    HPI [name] is a 83 y.o. female.  HPI Patient presents after a fall.  States she stepped out of bed and felt her left leg give out.  Complaining of pain since then.  Pain is at her left mid thigh.  No other injury.  Has had a hip replacement on that side by Dr. Luiz Blare 4 years ago.  She is not on anticoagulation.  She has a pacemaker. Past Medical History:  Diagnosis Date  . Complete heart block [facility])    s/p PPM by Dr Amil Amen 2004, recent Gen Change  07/13/10  . Depression   . DVT (deep venous thrombosis) (HCC)   . GERD (gastroesophageal reflux disease)   . Gout   . Hypertension   . Hypokalemia   . Recurrent UTI   . Small bowel obstruction [facility])     Patient Active Problem List   Diagnosis Date Noted  . C. difficile colitis 01/12/2015  . Partial small bowel obstruction (HCC)   . Adynamic ileus (HCC)   . Ileus (HCC)   . Acute encephalopathy 01/06/2015  . SBO (small bowel obstruction) (HCC) 01/05/2015  . Pancreatic mass 12/08/2013  . Poor prognosis 12/08/2013  . UTI (lower urinary tract infection) 10/15/2013  . Sepsis (HCC) 10/15/2013  . Septic shock (HCC) 10/15/2013  . Central retinal artery occlusion 06/09/2013  . Fracture of femoral neck, left (HCC) 10/06/2011  . Gait disorder 10/06/2011  . Strain of hip adductor muscle 10/06/2011  . Acute delirium 07/05/2011  . Fracture, femur (HCC) 07/02/2011  . Chest pain, unspecified   . Small bowel obstruction (HCC)   . Pacemaker   . Depression   . AV block   . Recurrent UTI   . SSS (sick sinus syndrome) (HCC)   . GERD (gastroesophageal reflux disease)   . DVT (deep venous thrombosis) (HCC)   . GOUT 07/06/2010  . Hypothyroidism 07/06/2010  . HYPOKALEMIA 07/06/2010  . ANEMIA 07/06/2010  . Essential  hypertension 07/06/2010    Past Surgical History:  Procedure Laterality Date  . HIP ARTHROPLASTY  07/02/2011   Procedure: ARTHROPLASTY BIPOLAR HIP;  Surgeon: Harvie Junior;  Location: [facility];  Service: Orthopedics;  Laterality: Left;  . INSERT / REPLACE / REMOVE PACEMAKER     initial pacemaker by Dr Amil Amen 2004, replaced by [facility] for ERI 07/13/10    OB History    No data available       Home Medications    Prior to Admission medications   Medication Sig Start Date End Date Taking? Authorizing Provider  acetaminophen (TYLENOL) 325 MG tablet Take 650 mg by mouth every 4 (four) hours as needed. For pain.    Yes [provider]  allopurinol (ZYLOPRIM) 100 MG tablet Take 100-200 mg by mouth daily.    Yes [provider]  ALPRAZolam Prudy Feeler) 0.5 MG tablet Take 0.5 tablets (0.25 mg total) by mouth 2 (two) times daily as needed for anxiety. 01/14/15  Yes Alison Murray, MD  beta carotene w/minerals (OCUVITE) tablet Take 1 tablet by mouth daily.   Yes [provider]  diltiazem (DILACOR XR) 180 MG 24 hr capsule Take 180 mg by mouth daily.     Yes [provider]  ENSURE (ENSURE) Take 237 mLs by mouth 2 (  two) times daily.     Yes [provider]  folic acid (FOLVITE) 1 MG tablet Take 1 mg by mouth daily.     Yes [provider]  furosemide (LASIX) 20 MG tablet TAKE 1 TABLET BY MOUTH  DAILY 09/26/16  Yes Seiler, [facility] K, NP  levothyroxine (SYNTHROID, LEVOTHROID) 112 MCG tablet Take 112 mcg by mouth daily before breakfast.   Yes [provider]  metoprolol tartrate (LOPRESSOR) 25 MG tablet Take 12.5 mg by mouth 2 (two) times daily. Take half tablet 12.5mg  2 times daily 04/14/15  Yes [provider]  VESICARE 5 MG tablet Take 1 tablet by mouth daily. 10/08/14  Yes [provider]  pantoprazole (PROTONIX) 40 MG tablet Take 40 mg by mouth daily. 04/28/15   [provider]  saccharomyces boulardii (FLORASTOR) 250 MG  capsule Take 1 capsule (250 mg total) by mouth 2 (two) times daily. Patient not taking: Reported on 06/26/2017 01/14/15   Alison Murrayevine, Alma M, MD    Family History Family History  Problem Relation Age of Onset  . Stroke Father     Social History Social History   Tobacco Use  . Smoking status: Former Smoker    Packs/day: 3.00    Years: 30.00    Pack years: 90.00    Types: Cigarettes    Last attempt to quit: 07/24/1980    Years since quitting: 36.9  . Smokeless tobacco: Never Used  Substance Use Topics  . Alcohol use: No  . Drug use: No     Allergies   Aspirin; Ativan [lorazepam]; and Penicillins   Review of Systems Review of Systems  Constitutional: Negative for appetite change.  Respiratory: Negative for shortness of breath.   Cardiovascular: Negative for chest pain.  Gastrointestinal: Negative for abdominal pain.  Musculoskeletal: Positive for back pain.       Left hip pain  Skin: Negative for rash.     Physical Exam Updated Vital Signs BP (!) 159/83   Pulse 67   Temp (!) 97.3 F (36.3 C) (Axillary) Comment: was not able to obtain oral temp.  Resp (!) 21   SpO2 100%   Physical Exam  Constitutional: She is oriented to person, place, and time. She appears well-developed.  HENT:  Head: Atraumatic.  Eyes: EOM are normal.  Neck: Neck supple.  Cardiovascular: Normal rate.  Pulmonary/Chest: Effort normal.  Abdominal: Soft. There is no tenderness.  Musculoskeletal: She exhibits tenderness.   tenderness and deformity with swelling to left mid femur.  Neurovascular intact in left foot.  No tenderness to the knee.  No hip tenderness.  No cervical spine tenderness.  Neurological: She is alert and oriented to person, place, and time.  Skin: Skin is warm. Capillary refill takes less than 2 seconds.     ED Treatments / Results  Labs (all labs ordered are listed, but only abnormal results are displayed) Labs Reviewed  BASIC METABOLIC PANEL - Abnormal; Notable for the  following components:      Result Value   Potassium 3.3 (*)    Glucose, Bld 131 (*)    Calcium 8.6 (*)    GFR calc non Af Amer 53 (*)    All other components within normal limits  CBC WITH DIFFERENTIAL/PLATELET - Abnormal; Notable for the following components:   WBC 17.8 (*)    Neutro Abs 15.6 (*)    All other components within normal limits  CBG MONITORING, ED - Abnormal; Notable for the following components:   Glucose-Capillary  112 (*)    All other components within normal limits  PROTIME-INR  TYPE AND SCREEN    EKG  EKG Interpretation  Date/Time:  Tuesday June 26 2017 18:32:00 EST Ventricular Rate:  83 PR Interval:    QRS Duration: 168 QT Interval:  461 QTC Calculation: 542 R Axis:   -87 Text Interpretation:  Atrial-ventricular dual-paced rhythm No further analysis attempted due to paced rhythm Confirmed by Benjiman CorePickering, Theophilus Walz [phone]([id number]) on 06/26/2017 6:55:12 PM       Radiology Dg Chest 1 View  Result Date: 06/26/2017 CLINICAL DATA:  83 year old female presents after fall. Left leg pain and swelling. EXAM: CHEST 1 VIEW COMPARISON:  10/15/2013 CXR FINDINGS: The heart size and mediastinal contours are within normal limits. Aortic atherosclerosis at the arch without aneurysm. Left-sided pacemaker apparatus with right atrial and right ventricular leads are again noted without change. Both lungs are clear. The visualized skeletal structures are unremarkable. IMPRESSION: No active disease.  Aortic atherosclerosis. Electronically Signed   By: Tollie Ethavid  Kwon M.D.   On: 06/26/2017 19:41   Dg Femur Min 2 Views Left  Result Date: 06/26/2017 CLINICAL DATA:  Left leg pain after fall today. EXAM: LEFT FEMUR 2 VIEWS COMPARISON:  Radiograph of July 02, 2011. FINDINGS: Status post left hemiarthroplasty. Moderately displaced fracture of left femur is noted with slightly overriding fracture fragments. Fracture begins at distal tip of prosthesis. IMPRESSION: Moderately displaced left femoral  shaft fracture is noted with slightly overriding fracture fragments. Proximal portion of fracture begins at distal tip of left hip prosthesis. Electronically Signed   By: Lupita RaiderJames  Green Jr, M.D.   On: 06/26/2017 19:40    Procedures Procedures (including critical care time)  Medications Ordered in ED Medications  0.9 %  sodium chloride infusion ( Intravenous New Bag/Given 06/26/17 1938)  fentaNYL (SUBLIMAZE) injection 100 mcg (not administered)  fentaNYL (SUBLIMAZE) injection 50 mcg (50 mcg Intravenous Given 06/26/17 1938)     Initial Impression / Assessment and Plan / ED Course  I have reviewed the triage vital signs and the nursing notes.  Pertinent labs & imaging results that were available during my care of the patient were reviewed by me and considered in my medical decision making (see chart for details).     Patient was left femur pain after fall.  His deformity.  Has left periprosthetic shaft fracture.  No other apparent injury.  Dr. Luiz BlareGraves replaced her left hip 6 years ago and was discussed with his group.  Will require admission.  She has a pacemaker.  Discussed with Dr. Bevelyn Ngoonald Dorff who is covering for Dr. Luiz BlareGraves.  Will see tomorrow but the Ortho trauma team reportedly would like [facility] at [facility].  Family states that she has been told in the past there is some surgery she could not have because of her high risk.  Will likely need to be cleared by medicine or cardiology.  Final Clinical Impressions(s) / ED Diagnoses   Final diagnoses:  Closed fracture of shaft of left femur, unspecified fracture morphology, initial encounter [facility](HCC)    ED Discharge Orders    None       Benjiman CorePickering, Orville Mena, MD 06/26/17 2200

## 2017-06-26 NOTE — ED Notes (Signed)
ED Provider at bedside. 

## 2017-06-26 NOTE — ED Triage Notes (Addendum)
Pt was ambulating in her home and fell after her left leg collapsed underneath her causing her to fall on her left hip. Fall happened at 4:45pm today. Patient does not report loss of consciousness Fire department put the patient in a c-collar. The patient is not complaining of neck or back pain. Pt given fentanyl at 1730.

## 2017-06-27 ENCOUNTER — Other Ambulatory Visit: Payer: Self-pay

## 2017-06-27 DIAGNOSIS — G9341 Metabolic encephalopathy: Secondary | ICD-10-CM | POA: Diagnosis present

## 2017-06-27 DIAGNOSIS — I252 Old myocardial infarction: Secondary | ICD-10-CM | POA: Diagnosis not present

## 2017-06-27 DIAGNOSIS — Z950 Presence of cardiac pacemaker: Secondary | ICD-10-CM

## 2017-06-27 DIAGNOSIS — Z66 Do not resuscitate: Secondary | ICD-10-CM | POA: Diagnosis present

## 2017-06-27 DIAGNOSIS — W19XXXA Unspecified fall, initial encounter: Secondary | ICD-10-CM | POA: Diagnosis not present

## 2017-06-27 DIAGNOSIS — I10 Essential (primary) hypertension: Secondary | ICD-10-CM | POA: Diagnosis present

## 2017-06-27 DIAGNOSIS — K5900 Constipation, unspecified: Secondary | ICD-10-CM | POA: Diagnosis not present

## 2017-06-27 DIAGNOSIS — T148XXA Other injury of unspecified body region, initial encounter: Secondary | ICD-10-CM | POA: Diagnosis not present

## 2017-06-27 DIAGNOSIS — D62 Acute posthemorrhagic anemia: Secondary | ICD-10-CM | POA: Diagnosis not present

## 2017-06-27 DIAGNOSIS — I350 Nonrheumatic aortic (valve) stenosis: Secondary | ICD-10-CM

## 2017-06-27 DIAGNOSIS — W06XXXA Fall from bed, initial encounter: Secondary | ICD-10-CM | POA: Diagnosis present

## 2017-06-27 DIAGNOSIS — F329 Major depressive disorder, single episode, unspecified: Secondary | ICD-10-CM | POA: Diagnosis present

## 2017-06-27 DIAGNOSIS — M109 Gout, unspecified: Secondary | ICD-10-CM | POA: Diagnosis present

## 2017-06-27 DIAGNOSIS — S7292XA Unspecified fracture of left femur, initial encounter for closed fracture: Secondary | ICD-10-CM | POA: Diagnosis not present

## 2017-06-27 DIAGNOSIS — I442 Atrioventricular block, complete: Secondary | ICD-10-CM

## 2017-06-27 DIAGNOSIS — S72302A Unspecified fracture of shaft of left femur, initial encounter for closed fracture: Secondary | ICD-10-CM | POA: Diagnosis present

## 2017-06-27 DIAGNOSIS — Z87891 Personal history of nicotine dependence: Secondary | ICD-10-CM | POA: Diagnosis not present

## 2017-06-27 DIAGNOSIS — F419 Anxiety disorder, unspecified: Secondary | ICD-10-CM | POA: Diagnosis present

## 2017-06-27 DIAGNOSIS — Y92009 Unspecified place in unspecified non-institutional (private) residence as the place of occurrence of the external cause: Secondary | ICD-10-CM

## 2017-06-27 DIAGNOSIS — S72009A Fracture of unspecified part of neck of unspecified femur, initial encounter for closed fracture: Secondary | ICD-10-CM | POA: Diagnosis present

## 2017-06-27 DIAGNOSIS — E039 Hypothyroidism, unspecified: Secondary | ICD-10-CM | POA: Diagnosis present

## 2017-06-27 DIAGNOSIS — E876 Hypokalemia: Secondary | ICD-10-CM | POA: Diagnosis present

## 2017-06-27 DIAGNOSIS — F0390 Unspecified dementia without behavioral disturbance: Secondary | ICD-10-CM | POA: Diagnosis present

## 2017-06-27 DIAGNOSIS — H9190 Unspecified hearing loss, unspecified ear: Secondary | ICD-10-CM | POA: Diagnosis present

## 2017-06-27 DIAGNOSIS — E785 Hyperlipidemia, unspecified: Secondary | ICD-10-CM | POA: Diagnosis present

## 2017-06-27 DIAGNOSIS — M9702XA Periprosthetic fracture around internal prosthetic left hip joint, initial encounter: Secondary | ICD-10-CM | POA: Diagnosis present

## 2017-06-27 DIAGNOSIS — Z79899 Other long term (current) drug therapy: Secondary | ICD-10-CM | POA: Diagnosis not present

## 2017-06-27 DIAGNOSIS — Z86718 Personal history of other venous thrombosis and embolism: Secondary | ICD-10-CM | POA: Diagnosis not present

## 2017-06-27 DIAGNOSIS — K219 Gastro-esophageal reflux disease without esophagitis: Secondary | ICD-10-CM | POA: Diagnosis present

## 2017-06-27 LAB — BASIC METABOLIC PANEL
Anion gap: 6 (ref 5–15)
BUN: 13 mg/dL (ref 6–20)
CALCIUM: 8.1 mg/dL — AB (ref 8.9–10.3)
CHLORIDE: 109 mmol/L (ref 101–111)
CO2: 24 mmol/L (ref 22–32)
CREATININE: 0.76 mg/dL (ref 0.44–1.00)
GFR calc non Af Amer: >60 mL/min (ref >60)
Glucose, Bld: 120 mg/dL — ABNORMAL HIGH (ref 65–99)
Potassium: 3.6 mmol/L (ref 3.5–5.1)
Sodium: 139 mmol/L (ref 135–145)

## 2017-06-27 LAB — MAGNESIUM: MAGNESIUM: 2.2 mg/dL (ref 1.7–2.4)

## 2017-06-27 LAB — URINALYSIS, ROUTINE W REFLEX MICROSCOPIC
BILIRUBIN URINE: NEGATIVE
Glucose, UA: NEGATIVE mg/dL
HGB URINE DIPSTICK: NEGATIVE
KETONES UR: 5 mg/dL — AB
Leukocytes, UA: NEGATIVE
Nitrite: NEGATIVE
PROTEIN: NEGATIVE mg/dL
SPECIFIC GRAVITY, URINE: 1.009 (ref 1.005–1.030)
pH: 9 — ABNORMAL HIGH (ref 5.0–8.0)

## 2017-06-27 LAB — CBC
HCT: 34.6 % — ABNORMAL LOW (ref 36.0–46.0)
Hemoglobin: 12 g/dL (ref 12.0–15.0)
MCH: 30.3 pg (ref 26.0–34.0)
MCHC: 34.7 g/dL (ref 30.0–36.0)
MCV: 87.4 fL (ref 78.0–100.0)
PLATELETS: 214 10*3/uL (ref 150–400)
RBC: 3.96 MIL/uL (ref 3.87–5.11)
RDW: 13.1 % (ref 11.5–15.5)
WBC: 10.6 10*3/uL — ABNORMAL HIGH (ref 4.0–10.5)

## 2017-06-27 LAB — TYPE AND SCREEN
ABO/RH(D): O POS
Antibody Screen: NEGATIVE

## 2017-06-27 MED ORDER — CHLORHEXIDINE GLUCONATE 4 % EX LIQD
60.0000 mL | Freq: Once | CUTANEOUS | Status: AC
  Administered 2017-06-28: 4 via TOPICAL
  Filled 2017-06-27: qty 60

## 2017-06-27 MED ORDER — POVIDONE-IODINE 10 % EX SWAB
2.0000 "application " | Freq: Once | CUTANEOUS | Status: AC
  Administered 2017-06-28: 2 via TOPICAL

## 2017-06-27 MED ORDER — DEXTROSE-NACL 5-0.9 % IV SOLN
INTRAVENOUS | Status: DC
  Administered 2017-06-27 – 2017-07-03 (×2): via INTRAVENOUS

## 2017-06-27 MED ORDER — CEFAZOLIN SODIUM-DEXTROSE 2-4 GM/100ML-% IV SOLN
2.0000 g | INTRAVENOUS | Status: AC
  Administered 2017-06-28: 2 g via INTRAVENOUS
  Filled 2017-06-27 (×2): qty 100

## 2017-06-27 NOTE — Progress Notes (Signed)
[facility] and [facility] staff. Pt awake but obvious dementia present. Left leg obvioulsly deformed and deviated from right leg by left foot turning inward.  Peri care given, purewick placed.  Patient exhibiting obvious signs of pain by rapid blowing respirations. Morphine 0.5 mg given IV as ordered along with xanax 0.25 po . Grandson present.

## 2017-06-27 NOTE — Consult Note (Signed)
Cardiology Consultation:   Patient ID: BERDELL [name]; [id number]; 1917-06-25   Admit date: 06/26/2017 Date of Consult: 06/27/2017  Primary Care Provider: Barbie Banner, MD Primary Cardiologist: Cindy. Elease [name] (last in 2012) Primary Electrophysiologist:  Cindy. Johney [name]   Patient Profile:   [name] is a 83 y.o. female with a hx of hypertension, HLD, left hip arthritis s/p replacement, hypothyroidism, CHB s/p PPM, mild aortic stenosis, NSTEMI in setting of sepsis in 2015 (trop2.2), right eye blindness secondary to retinal artery occlusion, DVT who is being seen today for the pre-operative evaluation for possible surgical repair of hip fracture at the request of Cindy. Nelson [name].  History of Present Illness:   [name] had a mechanical fall while getting out of bed yesterday. Xrays reveal a closed displaced left femoral shaft fracture near her previous hip prosthesis. She denies any chest pain or dyspnea surrounding the fall. She does not recall lightheadedness of dizziness related to the fall. She lives in her own home, but her grandson lives next door. He stays with her at night, prepares her meals and does all of her household chores. He says that she walks around her house all through the day and if the weather permits she walks up and down her long front porch for activity. She washes dishes and tries to do there housework, but he does most of the work. Her grandson thinks that she fell due to loss of balance. He says that he heard her call him of the intercom and she was alert saying "Oh I broke my hip again".   Currently she alert and oriented, although quite hard of hearing. She denies any chest pain, shortness of breath or any recent symptoms. She only has occ constipation since her CBD blockage in 2015. She has had no recent swelling.   She had a NSTEMI (trop 2.2) in 2015 in setting of sepsis with conservative management of the MI. She was found to have a pancreatic mass and had sepsis  related to CBD obstruction and cholangitis. She was thought to have subsequent poor prognosis and palliative care was consulted. She improved and has been doing well since then.   Last Med[employer]onic pacemaker (placed in 2004, with gen change in 2011) transmission on 05/03/2017 showed normal device function and appropriate histograms.  Her last cardiology office visit was on 05/03/2016 with Cindy Balsam, NP for electrophysiology follow up. She was dong well, was noted to have some increased shortness of breath and lower extremity edema. Her symptoms were thought to be multifactorial. Testing was discussed with the patient and family and they declined. She was started on lasix 20 mg daily. A BNP was checked and found to be normal at 97.5.   Past Medical History:  Diagnosis Date  . Complete heart block [facility])    s/p PPM by [name] 2004, recent Gen Change  07/13/10  . Depression   . DVT (deep venous thrombosis) (HCC)   . GERD (gastroesophageal reflux disease)   . Gout   . Hypertension   . Hypokalemia   . Recurrent UTI   . Small bowel obstruction [facility])     Past Surgical History:  Procedure Laterality Date  . HIP ARTHROPLASTY  07/02/2011   Procedure: ARTHROPLASTY BIPOLAR HIP;  Surgeon: [name];  Location: [facility];  Service: Orthopedics;  Laterality: Left;  . INSERT / REPLACE / REMOVE PACEMAKER     initial pacemaker by [name] 2004, replaced by [facility] for ERI 07/13/10  Home Medications:  Prior to Admission medications   Medication Sig Start Date End Date Taking? Authorizing Provider  acetaminophen (TYLENOL) 325 MG tablet Take 650 mg by mouth every 4 (four) hours as needed. For pain.    Yes [provider]  allopurinol (ZYLOPRIM) 100 MG tablet Take 100-200 mg by mouth daily.    Yes [provider]  ALPRAZolam Prudy Feeler(XANAX) 0.5 MG tablet Take 0.5 tablets (0.25 mg total) by mouth 2 (two) times daily as needed for anxiety. 01/14/15  Yes [name], Cindy M, MD  beta carotene  w/minerals (OCUVITE) tablet Take 1 tablet by mouth daily.   Yes [provider]  diltiazem (DILACOR XR) 180 MG 24 hr capsule Take 180 mg by mouth daily.     Yes [provider]  ENSURE (ENSURE) Take 237 mLs by mouth 2 (two) times daily.     Yes [provider]  folic acid (FOLVITE) 1 MG tablet Take 1 mg by mouth daily.     Yes [provider]  furosemide (LASIX) 20 MG tablet TAKE 1 TABLET BY MOUTH  DAILY 09/26/16  Yes [name], Cindy Hospitalsmber K, NP  levothyroxine (SYNTHROID, LEVOTHROID) 112 MCG tablet Take 112 mcg by mouth daily before breakfast.   Yes [provider]  metoprolol tartrate (LOPRESSOR) 25 MG tablet Take 12.5 mg by mouth 2 (two) times daily. Take half tablet 12.5mg  2 times daily 04/14/15  Yes [provider]  VESICARE 5 MG tablet Take 1 tablet by mouth daily. 10/08/14  Yes [provider]  pantoprazole (PROTONIX) 40 MG tablet Take 40 mg by mouth daily. 04/28/15   [provider]  saccharomyces boulardii (FLORASTOR) 250 MG capsule Take 1 capsule (250 mg total) by mouth 2 (two) times daily. Patient not taking: Reported on 06/26/2017 01/14/15   [name], Cindy M, MD    Inpatient Medications: Scheduled Meds: . allopurinol  100 mg Oral Daily  . darifenacin  7.5 mg Oral Daily  . diltiazem  180 mg Oral Daily  . enoxaparin (LOVENOX) injection  40 mg Subcutaneous Q24H  . folic acid  1 mg Oral Daily  . furosemide  20 mg Oral Daily  . levothyroxine  112 mcg Oral QAC breakfast  . metoprolol tartrate  12.5 mg Oral BID   Continuous Infusions: . sodium chloride 75 mL/hr at 06/27/17 0556   PRN Meds: ALPRAZolam, HYDROcodone-acetaminophen, morphine injection  Allergies:    Allergies  Allergen Reactions  . Aspirin     Prior history of stomach problems (? Ulcer), instructed to resume for stroke  . Ativan [Lorazepam] Other (See Comments)    Has opposite effect "makes her crazy"  . Penicillins Rash    Social History:   Social History    Socioeconomic History  . Marital status: Widowed    Spouse name: Not on file  . Number of children: 1  . Years of education: 11th   . Highest education level: Not on file  Social Needs  . Financial resource strain: Not on file  . Food insecurity - worry: Not on file  . Food insecurity - inability: Not on file  . Transportation needs - medical: Not on file  . Transportation needs - non-medical: Not on file  Occupational History    Employer: RETIRED  Tobacco Use  . Smoking status: Former Smoker    Packs/day: 3.00    Years: 30.00    Pack years: 90.00    Types: Cigarettes    Last attempt to quit: 07/24/1980    Years  since quitting: 36.9  . Smokeless tobacco: Never Used  Substance and Sexual Activity  . Alcohol use: No  . Drug use: No  . Sexual activity: No  Other Topics Concern  . Not on file  Social History Narrative   Patient lives at home with her grandson and caregiver.   Caffeine use: 1 cup daily    Family History:    Family History  Problem Relation Age of Onset  . Stroke Father      ROS:  Please see the history of present illness.  ROS  All other ROS reviewed and negative.     Physical Exam/Data:   Vitals:   06/27/17 0700 06/27/17 0800 06/27/17 0900 06/27/17 0921  BP:    (!) 127/98  Pulse: 66 66 65 84  Resp: 14 17 17 20   Temp:      TempSrc:      SpO2: 96% 98% 97% 98%   No intake or output data in the 24 hours ending 06/27/17 1054 There were no vitals filed for this visit. There is no height or weight on file to calculate BMI.  General:   elderly female, in no acute distress HEENT: normal Lymph: no adenopathy Neck: no JVD Endocrine:  No thryomegaly Vascular: No carotid bruits; FA pulses 2+ bilaterally without bruits  Cardiac:  normal S1, S2; RRR; 2/6 systolic murmur at RUSB Lungs:  clear to auscultation bilaterally, no wheezing, rhonchi or rales  Abd: soft, nontender, no hepatomegaly  Ext: Mild ankle edema, L>R  Musculoskeletal:  Left leg  inwardlyrotated, BUE strength normal and equal Skin: warm and dry  Neuro:  CNs 2-12 intact, no focal abnormalities noted Psych:  Normal affect   EKG:  The EKG was personally reviewed and demonstrates:  A-V pacing 83 bpm Telemetry:  Telemetry was personally reviewed and demonstrates:  V pacing in the 60's-70's  Relevant CV Studies:  Echocardiogram 06/27/2013 Study Conclusions  - Left ventricle: The cavity size was normal. There was focal basal hypertrophy. There was an increased relative contribution of atrial contraction to ventricular filling. - Aortic valve: There was mild stenosis. Trivial regurgitation. Mean gradient: 9mm Hg (S). Peak gradient: 14mm Hg (S). - Left atrium: The atrium was mildly to moderately dilated. - Pulmonary arteries: PA peak pressure: 34mm Hg (S).  Laboratory Data:  Chemistry Recent Labs  Lab 06/26/17 1925 06/27/17 0655  NA 139 139  K 3.3* 3.6  CL 104 109  CO2 25 24  GLUCOSE 131* 120*  BUN 15 13  CREATININE 0.88 0.76  CALCIUM 8.6* 8.1*  GFRNONAA 53* >60  GFRAA >60 >60  ANIONGAP 10 6    No results for input(s): PROT, ALBUMIN, AST, ALT, ALKPHOS, BILITOT in the last 168 hours. Hematology Recent Labs  Lab 06/26/17 1925 06/27/17 0655  WBC 17.8* 10.6*  RBC 4.44 3.96  HGB 13.0 12.0  HCT 38.7 34.6*  MCV 87.2 87.4  MCH 29.3 30.3  MCHC 33.6 34.7  RDW 13.0 13.1  PLT 245 214   Cardiac EnzymesNo results for input(s): TROPONINI in the last 168 hours. No results for input(s): TROPIPOC in the last 168 hours.  BNPNo results for input(s): BNP, PROBNP in the last 168 hours.  DDimer No results for input(s): DDIMER in the last 168 hours.  Radiology/Studies:  Dg Chest 1 View  Result Date: 06/26/2017 CLINICAL DATA:  83 year old female presents after fall. Left leg pain and swelling. EXAM: CHEST 1 VIEW COMPARISON:  10/15/2013 CXR FINDINGS: The heart size and mediastinal contours are within  normal limits. Aortic atherosclerosis at the arch  without aneurysm. Left-sided pacemaker apparatus with right atrial and right ventricular leads are again noted without change. Both lungs are clear. The visualized skeletal structures are unremarkable. IMPRESSION: No active disease.  Aortic atherosclerosis. Electronically Signed   By: Tollie Ethavid  Kwon M.D.   On: 06/26/2017 19:41   Dg Femur Min 2 Views Left  Result Date: 06/26/2017 CLINICAL DATA:  Left leg pain after fall today. EXAM: LEFT FEMUR 2 VIEWS COMPARISON:  Radiograph of July 02, 2011. FINDINGS: Status post left hemiarthroplasty. Moderately displaced fracture of left femur is noted with slightly overriding fracture fragments. Fracture begins at distal tip of prosthesis. IMPRESSION: Moderately displaced left femoral shaft fracture is noted with slightly overriding fracture fragments. Proximal portion of fracture begins at distal tip of left hip prosthesis. Electronically Signed   By: Lupita RaiderJames  Green Jr, M.D.   On: 06/26/2017 19:40    Assessment and Plan:   SSS s/p PPM -Last PPM download in October showed normal functioning -The patient has been doing well lately especially for her age. She has had no exertional chest discomfort or dyspnea and no peripheral edema.  Mild Aortic stenosis -Mild by echo in 2014. No significant increase in murmur.   Hypertension -BP is reasonably controlled. DBP intermittently elevated.  -Continue diltiazem, low dose metoprolol and low dose lasix.   Left hip fracture -Pt has been doing well for the last several years. She has hx of an MI during an episode of sepsis, but recovered well. She has had no recent exertional symptoms or fluid overload.   -She will likely need surgical repair to regain function and mobilty  According to the Revised Cardiac Risk Index (RCRI), her calculated perioperative risk of major cardiac event is 0.9%. However, given her advanced age of 83, she is at increased risk with any procedure.  Her baseline activity level is fairly low  although her family reports that she walks around the house and tries to do dishes, she does not climb stairs or do anything more exertional than washing dishes. Her family aids her a great deal. She will require close hemodynamic monitoring and care to treat any heart failure that may occur.  She can proceed without further cardiac testing.   Her surgical risk has been discussed the patient and her son. They understand that she needs to have surgery to be able to walk again in the future. They understand that due to her advanced age she has some elevated risk of complications and death.   For questions or updates, please contact [facility] Please consult [email or web address] for contact info under Cardiology/STEMI.   Signed, Berton BonJanine Baley Lorimer, NP  06/27/2017 10:54 AM

## 2017-06-27 NOTE — ED Notes (Signed)
Report called to Bjorn Loserhonda RN at [facility]

## 2017-06-27 NOTE — Progress Notes (Signed)
PROGRESS NOTE    [name]  ZOX:[id number] DOB: 1916-08-22 DOA: 06/26/2017 PCP: Barbie BannerWilson, Fred H, MD   Brief Narrative:  83 year old female with history of hypertension, hyperlipidemia, left hip arthritis status post replacement, complete heart block with pacemaker in place, hypothyroidism came to the hospital after a fall and found to have moderately displaced left femoral shaft fracture near her left hip processes.  Orthopedic consulted who plans are taken her [facility].  Cardiology consulted given her extensive disease and also upon family request prior to making surgical decision.   Assessment & Plan:   Principal Problem:   Displaced fracture of left femur (HCC) Active Problems:   Pacemaker   Complete heart block (HCC)   GOUT   Hypothyroidism   Hypokalemia   Fall at home, initial encounter  Mechanical fall Displaced left femoral fracture -[facility], awaiting bed for the transfer -Complete bedrest, pain medications as needed -N.p.o. past midnight -Give IV fluids; currently she is on DVT prophylaxis Lovenox which can be held tomorrow morning prior to her surgery -PT/OT after surgery -Bowel regimen as necessary given she is on narcotics now  History of complete heart block status post pacemaker - Had dual-chamber pacemaker placed in 2004 and generators was changed in 2011 -She is currently on Cardizem, cardiology consulted. Follows with Dr Johney FrameAllred.   Leukocytosis - This is resolved, this was likely probably reactive in nature.  No active signs of infection, no antibiotics necessary  Hypokalemia -Replete electrolytes as needed  Essential hypertension -Continue Cardizem, Lasix and metoprolol  Hypothyroidism -Continue Synthroid  Anxiety -Xanax as needed  Gout -Currently not in flare, continue allopurinol  DVT prophylaxis: Lovenox Code Status: DO NOT RESUSCITATE Family Communication:     Disposition Plan: To be determined  It is my clinical opinion that admission to INPATIENT is reasonable and necessary in this 83 y.o. female . presenting with symptoms of left hip pain, concerning for left hip fracture . in the context of PMH including: Complete heart block with pacemaker in place . with pertinent positives on physical exam including: Left hip/leg deformity with tenderness to palpation . and pertinent positives on radiographic and laboratory data including: Left hip fx on XR . Workup and treatment include Plans for surgical repair per ortho   Given the aforementioned, the predictability of an adverse outcome is felt to be significant. I expect that the patient will require at least 2 midnights in the hospital to treat this condition.     Consultants:   Orthopedics  Cardiology  Procedures:   The plan is for or tomorrow  Antimicrobials:   None   Subjective: Patient is currently confused which appears to her baseline given her advanced age and dementia.  Unable to obtain any meaningful history from her but she is able to tell me her name and reports that she has left hip pain upon movement.  Overall a poor historian.  Objective: Vitals:   06/27/17 0700 06/27/17 0800 06/27/17 0900 06/27/17 0921  BP:    (!) 127/98  Pulse: 66 66 65 84  Resp: 14 17 17 20   Temp:      TempSrc:      SpO2: 96% 98% 97% 98%   No intake or output data in the 24 hours ending 06/27/17 1053 There were no vitals filed for this visit.  Examination:  General exam: Appears calm and slight discomfort with movement.  Respiratory system: Clear to auscultation. Respiratory  effort normal. Cardiovascular system: S1 & S2 heard, RRR. No JVD, murmurs, rubs, gallops or clicks. No pedal edema. Gastrointestinal system: Abdomen is nondistended, soft and nontender. No organomegaly or masses felt. Normal bowel sounds heard. Central nervous system: Alert and oriented only to her name, no focal neuro  deficit.  Extremities: Symmetric 5 x 5 power. Skin: No rashes, lesions or ulcers Psychiatry: poor with her dementia     Data Reviewed:   CBC: Recent Labs  Lab 06/26/17 1925 06/27/17 0655  WBC 17.8* 10.6*  NEUTROABS 15.6*  --   HGB 13.0 12.0  HCT 38.7 34.6*  MCV 87.2 87.4  PLT 245 214   Basic Metabolic Panel: Recent Labs  Lab 06/26/17 1925 06/27/17 0655  NA 139 139  K 3.3* 3.6  CL 104 109  CO2 25 24  GLUCOSE 131* 120*  BUN 15 13  CREATININE 0.88 0.76  CALCIUM 8.6* 8.1*  MG  --  2.2   GFR: CrCl cannot be calculated (Unknown ideal weight.). Liver Function Tests: No results for input(s): AST, ALT, ALKPHOS, BILITOT, PROT, ALBUMIN in the last 168 hours. No results for input(s): LIPASE, AMYLASE in the last 168 hours. No results for input(s): AMMONIA in the last 168 hours. Coagulation Profile: Recent Labs  Lab 06/26/17 2119  INR 1.14   Cardiac Enzymes: No results for input(s): CKTOTAL, CKMB, CKMBINDEX, TROPONINI in the last 168 hours. BNP (last 3 results) No results for input(s): PROBNP in the last 8760 hours. HbA1C: No results for input(s): HGBA1C in the last 72 hours. CBG: Recent Labs  Lab 06/26/17 1842  GLUCAP 112*   Lipid Profile: No results for input(s): CHOL, HDL, LDLCALC, TRIG, CHOLHDL, LDLDIRECT in the last 72 hours. Thyroid Function Tests: No results for input(s): TSH, T4TOTAL, FREET4, T3FREE, THYROIDAB in the last 72 hours. Anemia Panel: No results for input(s): VITAMINB12, FOLATE, FERRITIN, TIBC, IRON, RETICCTPCT in the last 72 hours. Sepsis Labs: No results for input(s): PROCALCITON, LATICACIDVEN in the last 168 hours.  No results found for this or any previous visit (from the past 240 hour(s)).       Radiology Studies: Dg Chest 1 View  Result Date: 06/26/2017 CLINICAL DATA:  83 year old female presents after fall. Left leg pain and swelling. EXAM: CHEST 1 VIEW COMPARISON:  10/15/2013 CXR FINDINGS: The heart size and mediastinal  contours are within normal limits. Aortic atherosclerosis at the arch without aneurysm. Left-sided pacemaker apparatus with right atrial and right ventricular leads are again noted without change. Both lungs are clear. The visualized skeletal structures are unremarkable. IMPRESSION: No active disease.  Aortic atherosclerosis. Electronically Signed   By: Tollie Ethavid  Kwon M.D.   On: 06/26/2017 19:41   Dg Femur Min 2 Views Left  Result Date: 06/26/2017 CLINICAL DATA:  Left leg pain after fall today. EXAM: LEFT FEMUR 2 VIEWS COMPARISON:  Radiograph of July 02, 2011. FINDINGS: Status post left hemiarthroplasty. Moderately displaced fracture of left femur is noted with slightly overriding fracture fragments. Fracture begins at distal tip of prosthesis. IMPRESSION: Moderately displaced left femoral shaft fracture is noted with slightly overriding fracture fragments. Proximal portion of fracture begins at distal tip of left hip prosthesis. Electronically Signed   By: Lupita RaiderJames  Green Jr, M.D.   On: 06/26/2017 19:40        Scheduled Meds: . allopurinol  100 mg Oral Daily  . darifenacin  7.5 mg Oral Daily  . diltiazem  180 mg Oral Daily  . enoxaparin (LOVENOX) injection  40 mg Subcutaneous Q24H  .  folic acid  1 mg Oral Daily  . furosemide  20 mg Oral Daily  . levothyroxine  112 mcg Oral QAC breakfast  . metoprolol tartrate  12.5 mg Oral BID   Continuous Infusions: . sodium chloride 75 mL/hr at 06/27/17 0556     LOS: 0 days    Time spent: 30 mins    Cindy Mccaskey Joline Maxcy, MD [facility] Pager [phone]   If 7PM-7AM, please contact night-coverage [email or web address] Password [email or web address] 06/27/2017, 10:53 AM

## 2017-06-27 NOTE — Consult Note (Signed)
Reason for Consult:Left femur fx Referring Physician: P Dalldorf  [name] is an 83 y.o. female.  HPI: [name] was at home trying to get out of bed and fell. No syncope/presyncope. She felt like her leg just gave way. She had immediate pain and couldn't get up. EMS brought her to [facility] ED where x-rays showed a periprosthetic femur fx below her hip implant stem. Orthopedic surgery was consulted and recommended transfer to [facility] for definitive fixation by trauma service. Transfer was delayed 2/2 bed availability. She normally ambulates in home with a walker. Her grandson lives next door and takes care of her and spends the nights there.  Past Medical History:  Diagnosis Date  . Complete heart block [facility])    s/p PPM by Dr Leonia Reeves 2004, recent Gen Change  07/13/10  . Depression   . DVT (deep venous thrombosis) ([place])   . GERD (gastroesophageal reflux disease)   . Gout   . Hypertension   . Hypokalemia   . Recurrent UTI   . Small bowel obstruction [facility])     Past Surgical History:  Procedure Laterality Date  . HIP ARTHROPLASTY  07/02/2011   Procedure: ARTHROPLASTY BIPOLAR HIP;  Surgeon: Alta Corning;  Location: [facility] ORS;  Service: Orthopedics;  Laterality: Left;  . INSERT / REPLACE / REMOVE PACEMAKER     initial pacemaker by Dr Leonia Reeves 2004, replaced by [facility] for ERI 07/13/10    Family History  Problem Relation Age of Onset  . Stroke Father     Social History:  reports that she quit smoking about 36 years ago. Her smoking use included cigarettes. She has a 90.00 pack-year smoking history. she has never used smokeless tobacco. She reports that she does not drink alcohol or use drugs.  Allergies:  Allergies  Allergen Reactions  . Aspirin     Prior history of stomach problems (? Ulcer), instructed to resume for stroke  . Ativan [Lorazepam] Other (See Comments)    Has opposite effect "makes her crazy"  . Penicillins Rash    Medications: I have reviewed the patient's current  medications.  Results for orders placed or performed during the hospital encounter of 06/26/17 (from the past 48 hour(s))  CBG monitoring, ED     Status: Abnormal   Collection Time: 06/26/17  6:42 PM  Result Value Ref Range   Glucose-Capillary 112 (H) 65 - 99 mg/dL  Basic metabolic panel     Status: Abnormal   Collection Time: 06/26/17  7:25 PM  Result Value Ref Range   Sodium 139 135 - 145 mmol/L   Potassium 3.3 (L) 3.5 - 5.1 mmol/L   Chloride 104 101 - 111 mmol/L   CO2 25 22 - 32 mmol/L   Glucose, Bld 131 (H) 65 - 99 mg/dL   BUN 15 6 - 20 mg/dL   Creatinine, Ser 0.88 0.44 - 1.00 mg/dL   Calcium 8.6 (L) 8.9 - 10.3 mg/dL   GFR calc non Af Amer 53 (L) >60 mL/min   GFR calc Af Amer >60 >60 mL/min    Comment: (NOTE) The eGFR has been calculated using the CKD EPI equation. This calculation has not been validated in all clinical situations. eGFR's persistently <60 mL/min signify possible Chronic Kidney Disease.    Anion gap 10 5 - 15  CBC WITH DIFFERENTIAL     Status: Abnormal   Collection Time: 06/26/17  7:25 PM  Result Value Ref Range   WBC 17.8 (H) 4.0 - 10.5 K/uL  RBC 4.44 3.87 - 5.11 MIL/uL   Hemoglobin 13.0 12.0 - 15.0 g/dL   HCT 38.7 36.0 - 46.0 %   MCV 87.2 78.0 - 100.0 fL   MCH 29.3 26.0 - 34.0 pg   MCHC 33.6 30.0 - 36.0 g/dL   RDW 13.0 11.5 - 15.5 %   Platelets 245 150 - 400 K/uL   Neutrophils Relative % 88 %   Neutro Abs 15.6 (H) 1.7 - 7.7 K/uL   Lymphocytes Relative 7 %   Lymphs Abs 1.3 0.7 - 4.0 K/uL   Monocytes Relative 5 %   Monocytes Absolute 0.9 0.1 - 1.0 K/uL   Eosinophils Relative 0 %   Eosinophils Absolute 0.0 0.0 - 0.7 K/uL   Basophils Relative 0 %   Basophils Absolute 0.0 0.0 - 0.1 K/uL  Type and screen [place]     Status: None   Collection Time: 06/26/17  7:25 PM  Result Value Ref Range   ABO/RH(D) O POS    Antibody Screen NEG    Sample Expiration 06/29/2017   Protime-INR     Status: None   Collection Time: 06/26/17   9:19 PM  Result Value Ref Range   Prothrombin Time 14.5 11.4 - 15.2 seconds   INR 1.14   Urinalysis, Routine w reflex microscopic     Status: Abnormal   Collection Time: 06/27/17  6:11 AM  Result Value Ref Range   Color, Urine YELLOW YELLOW   APPearance HAZY (A) CLEAR   Specific Gravity, Urine 1.009 1.005 - 1.030   pH 9.0 (H) 5.0 - 8.0   Glucose, UA NEGATIVE NEGATIVE mg/dL   Hgb urine dipstick NEGATIVE NEGATIVE   Bilirubin Urine NEGATIVE NEGATIVE   Ketones, ur 5 (A) NEGATIVE mg/dL   Protein, ur NEGATIVE NEGATIVE mg/dL   Nitrite NEGATIVE NEGATIVE   Leukocytes, UA NEGATIVE NEGATIVE  Basic metabolic panel     Status: Abnormal   Collection Time: 06/27/17  6:55 AM  Result Value Ref Range   Sodium 139 135 - 145 mmol/L   Potassium 3.6 3.5 - 5.1 mmol/L   Chloride 109 101 - 111 mmol/L   CO2 24 22 - 32 mmol/L   Glucose, Bld 120 (H) 65 - 99 mg/dL   BUN 13 6 - 20 mg/dL   Creatinine, Ser 0.76 0.44 - 1.00 mg/dL   Calcium 8.1 (L) 8.9 - 10.3 mg/dL   GFR calc non Af Amer >60 >60 mL/min   GFR calc Af Amer >60 >60 mL/min    Comment: (NOTE) The eGFR has been calculated using the CKD EPI equation. This calculation has not been validated in all clinical situations. eGFR's persistently <60 mL/min signify possible Chronic Kidney Disease.    Anion gap 6 5 - 15  CBC     Status: Abnormal   Collection Time: 06/27/17  6:55 AM  Result Value Ref Range   WBC 10.6 (H) 4.0 - 10.5 K/uL   RBC 3.96 3.87 - 5.11 MIL/uL   Hemoglobin 12.0 12.0 - 15.0 g/dL   HCT 34.6 (L) 36.0 - 46.0 %   MCV 87.4 78.0 - 100.0 fL   MCH 30.3 26.0 - 34.0 pg   MCHC 34.7 30.0 - 36.0 g/dL   RDW 13.1 11.5 - 15.5 %   Platelets 214 150 - 400 K/uL  Magnesium     Status: None   Collection Time: 06/27/17  6:55 AM  Result Value Ref Range   Magnesium 2.2 1.7 - 2.4 mg/dL  Type and  screen [place]     Status: None (Preliminary result)   Collection Time: 06/27/17  6:45 PM  Result Value Ref Range   ABO/RH(D) O POS     Antibody Screen PENDING    Sample Expiration 06/30/2017     Dg Chest 1 View  Result Date: 06/26/2017 CLINICAL DATA:  83 year old female presents after fall. Left leg pain and swelling. EXAM: CHEST 1 VIEW COMPARISON:  10/15/2013 CXR FINDINGS: The heart size and mediastinal contours are within normal limits. Aortic atherosclerosis at the arch without aneurysm. Left-sided pacemaker apparatus with right atrial and right ventricular leads are again noted without change. Both lungs are clear. The visualized skeletal structures are unremarkable. IMPRESSION: No active disease.  Aortic atherosclerosis. Electronically Signed   By: Ashley Royalty M.D.   On: 06/26/2017 19:41   Dg Femur Min 2 Views Left  Result Date: 06/26/2017 CLINICAL DATA:  Left leg pain after fall today. EXAM: LEFT FEMUR 2 VIEWS COMPARISON:  Radiograph of July 02, 2011. FINDINGS: Status post left hemiarthroplasty. Moderately displaced fracture of left femur is noted with slightly overriding fracture fragments. Fracture begins at distal tip of prosthesis. IMPRESSION: Moderately displaced left femoral shaft fracture is noted with slightly overriding fracture fragments. Proximal portion of fracture begins at distal tip of left hip prosthesis. Electronically Signed   By: Marijo Conception, M.D.   On: 06/26/2017 19:40    Review of Systems  Constitutional: Negative for weight loss.  HENT: Negative for ear discharge, ear pain, hearing loss and tinnitus.   Eyes: Negative for blurred vision, double vision, photophobia and pain.  Respiratory: Negative for cough, sputum production and shortness of breath.   Cardiovascular: Negative for chest pain.  Gastrointestinal: Negative for abdominal pain, nausea and vomiting.  Genitourinary: Negative for dysuria, flank pain, frequency and urgency.  Musculoskeletal: Positive for joint pain (Left thigh). Negative for back pain, falls, myalgias and neck pain.  Neurological: Negative for dizziness, tingling,  sensory change, focal weakness, loss of consciousness and headaches.  Endo/Heme/Allergies: Does not bruise/bleed easily.  Psychiatric/Behavioral: Negative for depression, memory loss and substance abuse. The patient is not nervous/anxious.    Blood pressure 133/63, pulse 63, temperature 97.6 F (36.4 C), temperature source Oral, resp. rate 16, SpO2 95 %. Physical Exam  Constitutional: She appears well-developed and well-nourished. No distress.  HENT:  Head: Normocephalic.  Eyes: Conjunctivae are normal. Right eye exhibits no discharge. Left eye exhibits no discharge. No scleral icterus.  Neck: Normal range of motion.  Cardiovascular: Normal rate and regular rhythm.  Respiratory: Effort normal. No respiratory distress.  Musculoskeletal:  Bilateral shoulder, elbow, wrist, digits- no skin wounds, nontender, no instability, no blocks to motion  Sens  Ax/R/M/U intact  Mot   Ax/ R/ PIN/ M/ AIN/ U intact  Rad 2+  RLE No traumatic wounds, ecchymosis, or rash  Nontender  No knee or ankle effusion  Knee stable to varus/ valgus and anterior/posterior stress  Sens DPN, SPN, TN intact  Motor EHL, ext, flex, evers 5/5  DP 2+, PT 2+, No significant edema  LLE No traumatic wounds, ecchymosis, or rash  TTP thigh, moderate edema, leg short and internally rotated  No knee or ankle effusion  Knee stable to varus/ valgus and anterior/posterior stress  Sens DPN, SPN, TN intact  Motor EHL, ext, flex, evers 5/5  DP 2+, PT 1+, No significant edema  Neurological: She is alert.  Skin: Skin is warm and dry. She is not diaphoretic.  Psychiatric: She has  a normal mood and affect. Her behavior is normal.    Assessment/Plan: Left periprosthetic femur fx -- Plan for ORIF tomorrow morning by Dr. Marcelino Scot. NPO after MN. Spoke with grandson at bedside, all questions answered. Appreciate cardiology risk assessment and grandson/pt accepting. Multiple medical problems -- per IM    Lisette Abu,  PA-C Orthopedic Surgery [phone] 06/27/2017, 7:15 PM

## 2017-06-27 NOTE — Progress Notes (Signed)
Unable to obtain informed consent. Patient has dementia. Tried to obtain consent from grandson but left before I was able to.   Elsie Lincolnaven Burnetta Kohls, RN

## 2017-06-27 NOTE — ED Notes (Signed)
[facility] here to transport patient 

## 2017-06-27 NOTE — ED Notes (Signed)
Grandson cell number [phone] call when patient has a room

## 2017-06-28 ENCOUNTER — Inpatient Hospital Stay (HOSPITAL_COMMUNITY): Payer: Medicare Other

## 2017-06-28 ENCOUNTER — Inpatient Hospital Stay (HOSPITAL_COMMUNITY): Payer: Medicare Other | Admitting: Anesthesiology

## 2017-06-28 ENCOUNTER — Encounter (HOSPITAL_COMMUNITY)
Admission: EM | Disposition: A | Discharge status: Skilled Nursing Facility | Payer: Self-pay | Source: Home / Self Care | Attending: Internal Medicine

## 2017-06-28 ENCOUNTER — Encounter (HOSPITAL_COMMUNITY): Payer: Self-pay | Admitting: Certified Registered"

## 2017-06-28 DIAGNOSIS — S72302A Unspecified fracture of shaft of left femur, initial encounter for closed fracture: Secondary | ICD-10-CM

## 2017-06-28 HISTORY — PX: ORIF FEMUR FRACTURE: SHX2119

## 2017-06-28 LAB — APTT: aPTT: 34 seconds (ref 24–36)

## 2017-06-28 LAB — COMPREHENSIVE METABOLIC PANEL
ALT: 12 U/L — AB (ref 14–54)
AST: 35 U/L (ref 15–41)
Albumin: 3.1 g/dL — ABNORMAL LOW (ref 3.5–5.0)
Alkaline Phosphatase: 63 U/L (ref 38–126)
Anion gap: 10 (ref 5–15)
BILIRUBIN TOTAL: 0.8 mg/dL (ref 0.3–1.2)
BUN: 10 mg/dL (ref 6–20)
CHLORIDE: 104 mmol/L (ref 101–111)
CO2: 23 mmol/L (ref 22–32)
CREATININE: 0.67 mg/dL (ref 0.44–1.00)
Calcium: 8 mg/dL — ABNORMAL LOW (ref 8.9–10.3)
Glucose, Bld: 146 mg/dL — ABNORMAL HIGH (ref 65–99)
Potassium: 3.2 mmol/L — ABNORMAL LOW (ref 3.5–5.1)
Sodium: 137 mmol/L (ref 135–145)
TOTAL PROTEIN: 6.2 g/dL — AB (ref 6.5–8.1)

## 2017-06-28 LAB — CBC
HEMATOCRIT: 34.1 % — AB (ref 36.0–46.0)
Hemoglobin: 11.2 g/dL — ABNORMAL LOW (ref 12.0–15.0)
MCH: 28.8 pg (ref 26.0–34.0)
MCHC: 32.8 g/dL (ref 30.0–36.0)
MCV: 87.7 fL (ref 78.0–100.0)
PLATELETS: 216 10*3/uL (ref 150–400)
RBC: 3.89 MIL/uL (ref 3.87–5.11)
RDW: 13.2 % (ref 11.5–15.5)
WBC: 11.5 10*3/uL — ABNORMAL HIGH (ref 4.0–10.5)

## 2017-06-28 LAB — SURGICAL PCR SCREEN
MRSA, PCR: NEGATIVE
STAPHYLOCOCCUS AUREUS: NEGATIVE

## 2017-06-28 LAB — GLUCOSE, CAPILLARY: Glucose-Capillary: 134 mg/dL — ABNORMAL HIGH (ref 65–99)

## 2017-06-28 LAB — PROTIME-INR
INR: 1.22
PROTHROMBIN TIME: 15.3 s — AB (ref 11.4–15.2)

## 2017-06-28 SURGERY — OPEN REDUCTION INTERNAL FIXATION (ORIF) DISTAL FEMUR FRACTURE
Anesthesia: Spinal | Site: Leg Upper | Laterality: Left

## 2017-06-28 MED ORDER — FENTANYL CITRATE (PF) 100 MCG/2ML IJ SOLN
INTRAMUSCULAR | Status: AC
  Administered 2017-06-28: 25 ug via INTRAVENOUS
  Filled 2017-06-28: qty 2

## 2017-06-28 MED ORDER — METOCLOPRAMIDE HCL 5 MG/ML IJ SOLN: 5.0000 mg | Freq: Three times a day (TID) | INTRAMUSCULAR | Status: DC | PRN

## 2017-06-28 MED ORDER — ACETAMINOPHEN 650 MG RE SUPP: 650.0000 mg | RECTAL | Status: DC | PRN

## 2017-06-28 MED ORDER — FENTANYL CITRATE (PF) 100 MCG/2ML IJ SOLN
INTRAMUSCULAR | Status: DC | PRN
  Administered 2017-06-28 (×3): 50 ug via INTRAVENOUS

## 2017-06-28 MED ORDER — PHENYLEPHRINE 40 MCG/ML (10ML) SYRINGE FOR IV PUSH (FOR BLOOD PRESSURE SUPPORT)
PREFILLED_SYRINGE | INTRAVENOUS | Status: DC | PRN
  Administered 2017-06-28: 80 ug via INTRAVENOUS

## 2017-06-28 MED ORDER — TRANEXAMIC ACID 1000 MG/10ML IV SOLN
2000.0000 mg | INTRAVENOUS | Status: AC
  Administered 2017-06-28: 2000 mg via TOPICAL
  Filled 2017-06-28: qty 20

## 2017-06-28 MED ORDER — FENTANYL CITRATE (PF) 100 MCG/2ML IJ SOLN
25.0000 ug | Freq: Once | INTRAMUSCULAR | Status: AC
  Administered 2017-06-28: 25 ug via INTRAVENOUS
  Filled 2017-06-28: qty 0.5

## 2017-06-28 MED ORDER — LIDOCAINE 2% (20 MG/ML) 5 ML SYRINGE
INTRAMUSCULAR | Status: AC
  Filled 2017-06-28: qty 5

## 2017-06-28 MED ORDER — PHENYLEPHRINE 40 MCG/ML (10ML) SYRINGE FOR IV PUSH (FOR BLOOD PRESSURE SUPPORT)
PREFILLED_SYRINGE | INTRAVENOUS | Status: AC
  Filled 2017-06-28: qty 10

## 2017-06-28 MED ORDER — LACTATED RINGERS IV SOLN
INTRAVENOUS | Status: DC | PRN
  Administered 2017-06-28 (×3): via INTRAVENOUS

## 2017-06-28 MED ORDER — ONDANSETRON HCL 4 MG/2ML IJ SOLN: 4.0000 mg | Freq: Four times a day (QID) | INTRAMUSCULAR | Status: DC | PRN

## 2017-06-28 MED ORDER — ROCURONIUM BROMIDE 10 MG/ML (PF) SYRINGE
PREFILLED_SYRINGE | INTRAVENOUS | Status: AC
  Filled 2017-06-28: qty 5

## 2017-06-28 MED ORDER — 0.9 % SODIUM CHLORIDE (POUR BTL) OPTIME
TOPICAL | Status: DC | PRN
  Administered 2017-06-28: 1000 mL

## 2017-06-28 MED ORDER — ENOXAPARIN SODIUM 40 MG/0.4ML ~~LOC~~ SOLN
40.0000 mg | SUBCUTANEOUS | Status: DC
  Administered 2017-06-29 – 2017-07-03 (×5): 40 mg via SUBCUTANEOUS
  Filled 2017-06-28 (×5): qty 0.4

## 2017-06-28 MED ORDER — PROPOFOL 1000 MG/100ML IV EMUL
INTRAVENOUS | Status: AC
  Filled 2017-06-28: qty 100

## 2017-06-28 MED ORDER — FENTANYL CITRATE (PF) 100 MCG/2ML IJ SOLN
25.0000 ug | Freq: Once | INTRAMUSCULAR | Status: AC
  Administered 2017-06-28: 25 ug via INTRAVENOUS

## 2017-06-28 MED ORDER — FENTANYL CITRATE (PF) 250 MCG/5ML IJ SOLN
INTRAMUSCULAR | Status: AC
  Filled 2017-06-28: qty 5

## 2017-06-28 MED ORDER — PROPOFOL 10 MG/ML IV BOLUS
INTRAVENOUS | Status: AC
  Filled 2017-06-28: qty 20

## 2017-06-28 MED ORDER — FENTANYL CITRATE (PF) 100 MCG/2ML IJ SOLN: 25.0000 ug | INTRAMUSCULAR | Status: DC | PRN

## 2017-06-28 MED ORDER — BUPIVACAINE IN DEXTROSE 0.75-8.25 % IT SOLN
INTRATHECAL | Status: DC | PRN
  Administered 2017-06-28: 2 mL via INTRATHECAL

## 2017-06-28 MED ORDER — ONDANSETRON HCL 4 MG PO TABS: 4.0000 mg | ORAL_TABLET | Freq: Four times a day (QID) | ORAL | Status: DC | PRN

## 2017-06-28 MED ORDER — METOCLOPRAMIDE HCL 5 MG PO TABS: 5.0000 mg | ORAL_TABLET | Freq: Three times a day (TID) | ORAL | Status: DC | PRN

## 2017-06-28 MED ORDER — ACETAMINOPHEN 325 MG PO TABS
650.0000 mg | ORAL_TABLET | ORAL | Status: DC | PRN
  Administered 2017-06-28: 650 mg via ORAL
  Filled 2017-06-28: qty 2

## 2017-06-28 MED ORDER — POTASSIUM CHLORIDE CRYS ER 20 MEQ PO TBCR
40.0000 meq | EXTENDED_RELEASE_TABLET | Freq: Once | ORAL | Status: AC
  Administered 2017-06-29: 40 meq via ORAL
  Filled 2017-06-28: qty 2

## 2017-06-28 MED ORDER — PROPOFOL 10 MG/ML IV BOLUS
INTRAVENOUS | Status: DC | PRN
Start: 2017-06-28 — End: 2017-06-28
  Administered 2017-06-28 (×5): 10 mg via INTRAVENOUS
  Administered 2017-06-28: 20 mg via INTRAVENOUS

## 2017-06-28 MED ORDER — PHENYLEPHRINE HCL 10 MG/ML IJ SOLN
INTRAMUSCULAR | Status: DC | PRN
  Administered 2017-06-28: 20 ug/min via INTRAVENOUS

## 2017-06-28 MED ORDER — PROPOFOL 500 MG/50ML IV EMUL
INTRAVENOUS | Status: DC | PRN
  Administered 2017-06-28: 50 ug/kg/min via INTRAVENOUS

## 2017-06-28 MED ORDER — ONDANSETRON HCL 4 MG/2ML IJ SOLN: 4.0000 mg | Freq: Once | INTRAMUSCULAR | Status: DC | PRN

## 2017-06-28 MED ORDER — CLINDAMYCIN PHOSPHATE 600 MG/50ML IV SOLN
600.0000 mg | Freq: Four times a day (QID) | INTRAVENOUS | Status: AC
  Administered 2017-06-28 – 2017-06-29 (×3): 600 mg via INTRAVENOUS
  Filled 2017-06-28 (×3): qty 50

## 2017-06-28 SURGICAL SUPPLY — 74 items
BANDAGE ACE 4X5 VEL STRL LF (GAUZE/BANDAGES/DRESSINGS) ×3 IMPLANT
BANDAGE ACE 6X5 VEL STRL LF (GAUZE/BANDAGES/DRESSINGS) ×3 IMPLANT
BIT DRILL 4.3 (BIT) IMPLANT
BIT DRILL QC 3.3X195 (BIT) ×2 IMPLANT
BLADE CLIPPER SURG (BLADE) IMPLANT
BNDG GAUZE ELAST 4 BULKY (GAUZE/BANDAGES/DRESSINGS) ×3 IMPLANT
BRUSH SCRUB SURG 4.25 DISP (MISCELLANEOUS) ×6 IMPLANT
CABLE CERLAGE W/CRIMP 1.8 (Cable) ×1 IMPLANT
CABLE CERLAGE W/CRIMP 1.8MM (Cable) ×1 IMPLANT
CANISTER SUCT 3000ML PPV (MISCELLANEOUS) ×3 IMPLANT
CAP LOCK NCB (Cap) ×18 IMPLANT
COVER SURGICAL LIGHT HANDLE (MISCELLANEOUS) ×3 IMPLANT
DRAPE C-ARM 42X72 X-RAY (DRAPES) ×3 IMPLANT
DRAPE C-ARMOR (DRAPES) ×3 IMPLANT
DRAPE IMP U-DRAPE 54X76 (DRAPES) ×3 IMPLANT
DRAPE ORTHO SPLIT 77X108 STRL (DRAPES) ×9
DRAPE SURG ORHT 6 SPLT 77X108 (DRAPES) ×3 IMPLANT
DRAPE U-SHAPE 47X51 STRL (DRAPES) ×3 IMPLANT
DRILL BIT 4.3 (BIT) ×3
DRSG ADAPTIC 3X8 NADH LF (GAUZE/BANDAGES/DRESSINGS) ×3 IMPLANT
DRSG PAD ABDOMINAL 8X10 ST (GAUZE/BANDAGES/DRESSINGS) ×12 IMPLANT
DRSG VAC ATS LRG SENSATRAC (GAUZE/BANDAGES/DRESSINGS) ×2 IMPLANT
ELECT REM PT RETURN 9FT ADLT (ELECTROSURGICAL) ×3
ELECTRODE REM PT RTRN 9FT ADLT (ELECTROSURGICAL) ×1 IMPLANT
EVACUATOR 1/8 PVC DRAIN (DRAIN) IMPLANT
EVACUATOR 3/16  PVC DRAIN (DRAIN)
EVACUATOR 3/16 PVC DRAIN (DRAIN) IMPLANT
GAUZE SPONGE 4X4 12PLY STRL (GAUZE/BANDAGES/DRESSINGS) ×3 IMPLANT
GLOVE BIO SURGEON STRL SZ7.5 (GLOVE) ×3 IMPLANT
GLOVE BIO SURGEON STRL SZ8 (GLOVE) ×3 IMPLANT
GLOVE BIOGEL PI IND STRL 7.5 (GLOVE) ×1 IMPLANT
GLOVE BIOGEL PI IND STRL 8 (GLOVE) ×1 IMPLANT
GLOVE BIOGEL PI INDICATOR 7.5 (GLOVE) ×2
GLOVE BIOGEL PI INDICATOR 8 (GLOVE) ×2
GOWN STRL REUS W/ TWL LRG LVL3 (GOWN DISPOSABLE) ×2 IMPLANT
GOWN STRL REUS W/ TWL XL LVL3 (GOWN DISPOSABLE) ×1 IMPLANT
GOWN STRL REUS W/TWL LRG LVL3 (GOWN DISPOSABLE) ×6
GOWN STRL REUS W/TWL XL LVL3 (GOWN DISPOSABLE) ×3
KIT BASIN OR (CUSTOM PROCEDURE TRAY) ×3 IMPLANT
KIT ROOM TURNOVER OR (KITS) ×3 IMPLANT
LOCKPLATE CABLE BUTTON NCP HIP (Orthopedic Implant) ×2 IMPLANT
NEEDLE 22X1 1/2 (OR ONLY) (NEEDLE) IMPLANT
NS IRRIG 1000ML POUR BTL (IV SOLUTION) ×3 IMPLANT
PACK TOTAL JOINT (CUSTOM PROCEDURE TRAY) ×3 IMPLANT
PACK UNIVERSAL I (CUSTOM PROCEDURE TRAY) ×3 IMPLANT
PAD ARMBOARD 7.5X6 YLW CONV (MISCELLANEOUS) ×6 IMPLANT
PAD CAST 4YDX4 CTTN HI CHSV (CAST SUPPLIES) ×1 IMPLANT
PADDING CAST COTTON 4X4 STRL (CAST SUPPLIES) ×3
PADDING CAST COTTON 6X4 STRL (CAST SUPPLIES) ×3 IMPLANT
PLATE LEFT PROX FEMUR 15H (Plate) ×2 IMPLANT
SCREW CORTICAL 5.0X10MM (Screw) ×2 IMPLANT
SCREW NCB 4.0 32MM (Screw) ×2 IMPLANT
SCREW NCB 4.0MX30M (Screw) ×2 IMPLANT
SCREW NCB 4.0MX34M (Screw) ×4 IMPLANT
SCREW NCB 4.0MX42M (Screw) ×2 IMPLANT
SCREW NCB 4.0MX46M (Screw) ×2 IMPLANT
SCREW NCB 4.0MX48M (Screw) ×2 IMPLANT
SCREW NCB 4.0X36MM (Screw) ×2 IMPLANT
SPONGE LAP 18X18 X RAY DECT (DISPOSABLE) ×3 IMPLANT
STAPLER VISISTAT 35W (STAPLE) ×3 IMPLANT
SUCTION FRAZIER HANDLE 10FR (MISCELLANEOUS) ×2
SUCTION TUBE FRAZIER 10FR DISP (MISCELLANEOUS) ×1 IMPLANT
SUT PROLENE 0 CT 2 (SUTURE) IMPLANT
SUT VIC AB 0 CT1 27 (SUTURE) ×6
SUT VIC AB 0 CT1 27XBRD ANBCTR (SUTURE) ×2 IMPLANT
SUT VIC AB 1 CT1 27 (SUTURE) ×6
SUT VIC AB 1 CT1 27XBRD ANBCTR (SUTURE) ×2 IMPLANT
SUT VIC AB 2-0 CT1 27 (SUTURE) ×6
SUT VIC AB 2-0 CT1 TAPERPNT 27 (SUTURE) ×2 IMPLANT
SYR 20ML ECCENTRIC (SYRINGE) IMPLANT
TOWEL OR 17X24 6PK STRL BLUE (TOWEL DISPOSABLE) ×3 IMPLANT
TOWEL OR 17X26 10 PK STRL BLUE (TOWEL DISPOSABLE) ×6 IMPLANT
TRAY FOLEY W/METER SILVER 16FR (SET/KITS/TRAYS/PACK) IMPLANT
WATER STERILE IRR 1000ML POUR (IV SOLUTION) ×6 IMPLANT

## 2017-06-28 NOTE — Transfer of Care (Signed)
Immediate Anesthesia Transfer of Care Note  Patient: [name]  Procedure(s) Performed: OPEN REDUCTION INTERNAL FIXATION (ORIF) DISTAL FEMUR FRACTURE (Left Leg Upper)  Patient Location: PACU  Anesthesia Type:Spinal, Epidural and MAC combined with regional for post-op pain  Level of Consciousness: awake and patient cooperative  Airway & Oxygen Therapy: Patient Spontanous Breathing  Post-op Assessment: Report given to RN, Post -op Vital signs reviewed and stable and Patient moving all extremities  Post vital signs: Reviewed and stable  Last Vitals:  Vitals:   06/27/17 1955 06/28/17 1429  BP: (!) 152/60   Pulse: 70   Resp: 18   Temp: (!) 36.1 C (!) 36.4 C  SpO2: 96%     Last Pain:  Vitals:   06/27/17 1955  TempSrc: Oral  PainSc:          Complications: No apparent anesthesia complications

## 2017-06-28 NOTE — Anesthesia Procedure Notes (Signed)
Epidural Patient location during procedure: OR Start time: 06/28/2017 10:30 AM End time: 06/28/2017 10:45 AM  Staffing Anesthesiologist: Leonides GrillsEllender, Ryan P, MD Performed: anesthesiologist   Preanesthetic Checklist Completed: patient identified, site marked, pre-op evaluation, timeout performed, IV checked, risks and benefits discussed and monitors and equipment checked  Epidural Patient position: left lateral decubitus Prep: DuraPrep Patient monitoring: heart rate, cardiac monitor, continuous pulse ox and blood pressure Approach: midline Location: L4-L5 Injection technique: LOR air  Needle:  Needle type: Tuohy  Needle gauge: 17 G Needle length: 9 cm Needle insertion depth: 7 cm Catheter type: closed end flexible Catheter size: 19 Gauge Catheter at skin depth: 12 cm Test dose: negative and Other  Assessment Events: blood not aspirated, injection not painful, no injection resistance and negative IV test  Additional Notes Informed consent obtained prior to proceeding. Discussed alternatives to epidural analgesia and patient desires to proceed.  Timeout performed pre-procedure verifying patient name, procedure, and platelet count.  Patient tolerated procedure well. Reason for block:surgical anesthesia

## 2017-06-28 NOTE — Progress Notes (Signed)
[facility]                                                                              Patient Demographics  [name], is a 83 y.o. female, DOB - 06/05/1917, [id number]  Admit date - 06/26/2017   Admitting Physician Ankit Joline Maxcyhirag Amin, MD  Outpatient Primary MD for the patient is Barbie BannerWilson, Fred H, MD  Outpatient specialists:   LOS - 1  days   Medical records reviewed and are as summarized below:    Chief Complaint  Patient presents with  . Hip Injury       Brief summary   83 year old female with history of hypertension, hyperlipidemia, left hip arthritis status post replacement, complete heart block with pacemaker in place, hypothyroidism came to the hospital after a fall and found to have moderately displaced left femoral shaft fracture near her left hip processes.  Orthopedic consulted who plans are [facility].  Cardiology consulted given her extensive disease and also upon family request prior to making surgical decision.   Assessment & Plan    Principal Problem:   Displaced fracture of left femur (HCC) after mechanical fall -Continue pain control IV fluid hydration - underwent ORIF today per ortho, see in PACU -DVT prophylaxis per orthopedics  Active Problems: History of complete heart block status post pacemaker (placed in 2004, generator was changed in 2011) -Cardiology following, currently on Cardizem.   - Outpatient follows with Dr. Johney FrameAllred.  Hypokalemia - K 3.2 replaced PO  Hypothyroidism -Continue Synthroid  Essential hypertension -BP stable, continue Cardizem, Lasix, metoprolol  History of gout -Currently stable, no flare, continue allopurinol   Code Status: full  DVT Prophylaxis:  SCD Family Communication:   Disposition Plan: will need SNF  Time Spent in minutes   25 minutes  Procedures:  ORIF left femur   Consultants:   Ortho   Antimicrobials:       Medications  Scheduled Meds: . [MAR Hold] allopurinol  100 mg Oral Daily  . [MAR Hold] darifenacin  7.5 mg Oral Daily  . [MAR Hold] diltiazem  180 mg Oral Daily  . [MAR Hold] folic acid  1 mg Oral Daily  . [MAR Hold] furosemide  20 mg Oral Daily  . [MAR Hold] levothyroxine  112 mcg Oral QAC breakfast  . [MAR Hold] metoprolol tartrate  12.5 mg Oral BID   Continuous Infusions: . sodium chloride Stopped (06/27/17 1749)  . dextrose 5 % and 0.9% NaCl 75 mL/hr at 06/28/17 0625   PRN Meds:.[MAR Hold] ALPRAZolam, [MAR Hold] HYDROcodone-acetaminophen, [MAR Hold]  morphine injection   Antibiotics   Anti-infectives (From admission, onward)   Start     Dose/Rate Route Frequency Ordered Stop   06/28/17 0740  ceFAZolin (ANCEF) IVPB 2g/100 mL premix     2 g 200 mL/hr over 30 Minutes [facility]ous [facility] 06/27/17 2003 06/28/17 1057        Subjective:   [name] was seen and examined today in PACU. Shivering but alert and awake.    Objective:   Vitals:   06/27/17 1230 06/27/17 1441 06/27/17 1955 06/28/17  1429  BP: (!) 105/55 133/63 (!) 152/60   Pulse: 62 63 70   Resp: 15 16 18    Temp:   (!) 97 F (36.1 C) (!) 97.5 F (36.4 C)  TempSrc:   Oral   SpO2: 92% 95% 96%     Intake/Output Summary (Last 24 hours) at 06/28/2017 1436 Last data filed at 06/28/2017 1419 Gross per 24 hour  Intake 4081.25 ml  Output 1000 ml  Net 3081.25 ml     Wt Readings from Last 3 Encounters:  05/03/16 74.8 kg (164 lb 12.8 oz)  05/03/15 75.2 kg (165 lb 12.8 oz)  01/05/15 73.8 kg (162 lb 9.6 oz)     Exam  General: Alert and awake, oriented to self, NAD  Eyes:   HEENT:    Cardiovascular: S1 S2 clear, 2/6 SEM, RRR  Respiratory: Clear to auscultation bilaterally, no wheezing  Gastrointestinal: Soft, nontender, nondistended, + bowel sounds  Ext: no pedal edema bilaterally  Neuro: unable to asess  Musculoskeletal: No digital cyanosis, clubbing  Skin: No  rashes  Psych: Normal affect and demeanor, alert    Data Reviewed:  I have personally reviewed following labs and imaging studies  Micro Results Recent Results (from the past 240 hour(s))  Surgical pcr screen     Status: None   Collection Time: 06/27/17  8:19 PM  Result Value Ref Range Status   MRSA, PCR NEGATIVE NEGATIVE Final   Staphylococcus aureus NEGATIVE NEGATIVE Final    Comment: (NOTE) The Xpert SA Assay (FDA approved for NASAL specimens in patients 39 years of age and older), is one component of a comprehensive surveillance program. It is not intended to diagnose infection nor to guide or monitor treatment.     Radiology Reports Dg Chest 1 View  Result Date: 06/26/2017 CLINICAL DATA:  83 year old female presents after fall. Left leg pain and swelling. EXAM: CHEST 1 VIEW COMPARISON:  10/15/2013 CXR FINDINGS: The heart size and mediastinal contours are within normal limits. Aortic atherosclerosis at the arch without aneurysm. Left-sided pacemaker apparatus with right atrial and right ventricular leads are again noted without change. Both lungs are clear. The visualized skeletal structures are unremarkable. IMPRESSION: No active disease.  Aortic atherosclerosis. Electronically Signed   By: Tollie Eth M.D.   On: 06/26/2017 19:41   Dg C-arm Gt 120 Min  Result Date: 06/28/2017 CLINICAL DATA:  ORIF left femur. EXAM: DG C-ARM GT 120 MIN; LEFT FEMUR 2 VIEWS CONTRAST:  None. FLUOROSCOPY TIME:  Fluoroscopy Time:  0 minutes 35 seconds Number of Acquired Spot Images: 9 COMPARISON:  None. FINDINGS: Postsurgical changes left femur. Hardware intact. Anatomic alignment. IMPRESSION: Postsurgical changes left femur.  Anatomic alignment. Electronically Signed   By: Maisie Fus  Register   On: 06/28/2017 13:50   Dg Femur Min 2 Views Left  Result Date: 06/28/2017 CLINICAL DATA:  ORIF left femur. EXAM: DG C-ARM GT 120 MIN; LEFT FEMUR 2 VIEWS CONTRAST:  None. FLUOROSCOPY TIME:  Fluoroscopy Time:  0  minutes 35 seconds Number of Acquired Spot Images: 9 COMPARISON:  None. FINDINGS: Postsurgical changes left femur. Hardware intact. Anatomic alignment. IMPRESSION: Postsurgical changes left femur.  Anatomic alignment. Electronically Signed   By: Maisie Fus  Register   On: 06/28/2017 13:50   Dg Femur Min 2 Views Left  Result Date: 06/26/2017 CLINICAL DATA:  Left leg pain after fall today. EXAM: LEFT FEMUR 2 VIEWS COMPARISON:  Radiograph of July 02, 2011. FINDINGS: Status post left hemiarthroplasty. Moderately displaced fracture of left femur is noted with  slightly overriding fracture fragments. Fracture begins at distal tip of prosthesis. IMPRESSION: Moderately displaced left femoral shaft fracture is noted with slightly overriding fracture fragments. Proximal portion of fracture begins at distal tip of left hip prosthesis. Electronically Signed   By: Lupita RaiderJames  Green Jr, M.D.   On: 06/26/2017 19:40    Lab Data:  CBC: Recent Labs  Lab 06/26/17 1925 06/27/17 0655 06/28/17 0609  WBC 17.8* 10.6* 11.5*  NEUTROABS 15.6*  --   --   HGB 13.0 12.0 11.2*  HCT 38.7 34.6* 34.1*  MCV 87.2 87.4 87.7  PLT 245 214 216   Basic Metabolic Panel: Recent Labs  Lab 06/26/17 1925 06/27/17 0655 06/28/17 0609  NA 139 139 137  K 3.3* 3.6 3.2*  CL 104 109 104  CO2 25 24 23   GLUCOSE 131* 120* 146*  BUN 15 13 10   CREATININE 0.88 0.76 0.67  CALCIUM 8.6* 8.1* 8.0*  MG  --  2.2  --    GFR: CrCl cannot be calculated (Unknown ideal weight.). Liver Function Tests: Recent Labs  Lab 06/28/17 0609  AST 35  ALT 12*  ALKPHOS 63  BILITOT 0.8  PROT 6.2*  ALBUMIN 3.1*   No results for input(s): LIPASE, AMYLASE in the last 168 hours. No results for input(s): AMMONIA in the last 168 hours. Coagulation Profile: Recent Labs  Lab 06/26/17 2119 06/28/17 0609  INR 1.14 1.22   Cardiac Enzymes: No results for input(s): CKTOTAL, CKMB, CKMBINDEX, TROPONINI in the last 168 hours. BNP (last 3 results) No results  for input(s): PROBNP in the last 8760 hours. HbA1C: No results for input(s): HGBA1C in the last 72 hours. CBG: Recent Labs  Lab 06/26/17 1842  GLUCAP 112*   Lipid Profile: No results for input(s): CHOL, HDL, LDLCALC, TRIG, CHOLHDL, LDLDIRECT in the last 72 hours. Thyroid Function Tests: No results for input(s): TSH, T4TOTAL, FREET4, T3FREE, THYROIDAB in the last 72 hours. Anemia Panel: No results for input(s): VITAMINB12, FOLATE, FERRITIN, TIBC, IRON, RETICCTPCT in the last 72 hours. Urine analysis:    Component Value Date/Time   COLORURINE YELLOW 06/27/2017 0611   APPEARANCEUR HAZY (A) 06/27/2017 0611   LABSPEC 1.009 06/27/2017 0611   PHURINE 9.0 (H) 06/27/2017 0611   GLUCOSEU NEGATIVE 06/27/2017 0611   HGBUR NEGATIVE 06/27/2017 0611   BILIRUBINUR NEGATIVE 06/27/2017 0611   KETONESUR 5 (A) 06/27/2017 0611   PROTEINUR NEGATIVE 06/27/2017 0611   UROBILINOGEN 1.0 01/05/2015 1427   NITRITE NEGATIVE 06/27/2017 0611   LEUKOCYTESUR NEGATIVE 06/27/2017 [id number]     [name] M.D. [facility] 06/28/2017, 2:36 PM  Pager: [phone] Between 7am to 7pm - call Pager - [phone]  After 7pm go to [email or web address] - password [email or web address]  Call night coverage person covering after 7pm

## 2017-06-28 NOTE — Anesthesia Preprocedure Evaluation (Addendum)
Anesthesia Evaluation  Patient identified by MRN, date of birth, ID band Patient awake    Reviewed: Allergy & Precautions, H&P , NPO status , Patient's Chart, lab work & pertinent test results  Airway Mallampati: II  TM Distance: >3 FB Neck ROM: Limited    Dental no notable dental hx. (+) Edentulous Upper, Edentulous Lower, Dental Advisory Given   Pulmonary former smoker,    Pulmonary exam normal breath sounds clear to auscultation       Cardiovascular hypertension, Pt. on medications +CHF  Normal cardiovascular exam+ pacemaker + Valvular Problems/Murmurs AS  Rhythm:Regular Rate:Normal  ECG: AV paced, rate 83  Complete heart block s/p PM: Patient followed by Dr. Johney FrameAllred of cardiology.  She has a MDT dual chamber PPM implanted 2004 for complete heart block, gen change 2011  She also has mild AS with normal LV function by echo in 2014.  Pre-op eval per cardiology ([place])   Neuro/Psych PSYCHIATRIC DISORDERS Anxiety Depression Dementia    GI/Hepatic Neg liver ROS, GERD  Medicated,  Endo/Other  Hypothyroidism   Renal/GU negative Renal ROS     Musculoskeletal Gout Peri-Prothestic Femur Fracture   Abdominal   Peds  Hematology negative hematology ROS (+)   Anesthesia Other Findings DVT (deep venous thrombosis)   Reproductive/Obstetrics                            Anesthesia Physical  Anesthesia Plan  ASA: III  Anesthesia Plan: Combined Spinal and Epidural   Post-op Pain Management:    Induction:   PONV Risk Score and Plan: 2 and Propofol infusion  Airway Management Planned: Natural Airway  Additional Equipment: None  Intra-op Plan:   Post-operative Plan:   Informed Consent: I have reviewed the patients History and Physical, chart, labs and discussed the procedure including the risks, benefits and alternatives for the proposed anesthesia with the patient or authorized representative  who has indicated his/her understanding and acceptance.   Dental advisory given  Plan Discussed with: CRNA, Anesthesiologist and Surgeon  Anesthesia Plan Comments:       Anesthesia Quick Evaluation

## 2017-06-28 NOTE — Progress Notes (Signed)
Orthopedic Tech Progress Note Patient Details:  [name] 08/24/1916 [id number]  Ortho Devices Ortho Device/Splint Location: footsie roll Ortho Device/Splint Interventions: Casandra DoffingOrdered       Devon Kingdon Craig 06/28/2017, 5:27 PM

## 2017-06-28 NOTE — Anesthesia Procedure Notes (Signed)
Spinal  Patient location during procedure: OR Start time: 06/28/2017 10:30 AM End time: 06/28/2017 10:45 AM Staffing Anesthesiologist: Leonides GrillsEllender, Brookelynne Dimperio P, MD Performed: anesthesiologist  Preanesthetic Checklist Completed: patient identified, surgical consent, pre-op evaluation, timeout performed, IV checked, risks and benefits discussed and monitors and equipment checked Spinal Block Patient position: left lateral decubitus Prep: DuraPrep Patient monitoring: cardiac monitor, continuous pulse ox and blood pressure Approach: midline Location: L4-5 Injection technique: single-shot Needle Needle type: Pencan  Needle gauge: 24 G Needle length: 9 cm Assessment Sensory level: T10 Additional Notes Functioning IV was confirmed and monitors were applied. Sterile prep and drape, including hand hygiene and sterile gloves were used. The patient was positioned and the spine was prepped. The skin was anesthetized with lidocaine.  Free flow of clear CSF was obtained prior to injecting local anesthetic into the CSF.  The spinal needle aspirated freely following injection.  The needle was carefully withdrawn.  The patient tolerated the procedure well.

## 2017-06-28 NOTE — Brief Op Note (Signed)
06/26/2017 - 06/28/2017  1:34 PM  PATIENT:  Ether GriffinsEdna R Dunne  83 y.o. female  PRE-OPERATIVE DIAGNOSIS:  Peri-Prothestic Femur Fracture, left  POST-OPERATIVE DIAGNOSIS:  Peri-Prothestic Femur Fracture, left  PROCEDURE:  Procedure(s): OPEN REDUCTION INTERNAL FIXATION (ORIF) FEMUR FRACTURE (Left)  SURGEON:  Surgeon(s) and Role:    Myrene Galas* Mohsin Crum, MD - Primary  PHYSICIAN ASSISTANT: 1. Montez MoritaKeith Paul, PA-C, 2. PA student  ANESTHESIA:   general  EBL:  450 mL   BLOOD ADMINISTERED:none  DRAINS: none   LOCAL MEDICATIONS USED:  NONE  SPECIMEN:  No Specimen  DISPOSITION OF SPECIMEN:  N/A  COUNTS:  YES  TOURNIQUET:  * No tourniquets in log *  DICTATION: .Other Dictation: Dictation Number [id number]  PLAN OF CARE: Admit to inpatient   PATIENT DISPOSITION:  PACU - hemodynamically stable.   Delay start of Pharmacological VTE agent (>24hrs) due to surgical blood loss or risk of bleeding: no

## 2017-06-28 NOTE — Anesthesia Postprocedure Evaluation (Signed)
Anesthesia Post Note  Patient: Ether Griffinsdna R Uecker  Procedure(s) Performed: OPEN REDUCTION INTERNAL FIXATION (ORIF) DISTAL FEMUR FRACTURE (Left Leg Upper)     Patient location during evaluation: PACU Anesthesia Type: Combined Spinal/Epidural Level of consciousness: patient cooperative (return to baseline) Pain management: pain level controlled Vital Signs Assessment: post-procedure vital signs reviewed and stable Respiratory status: spontaneous breathing, respiratory function stable and patient connected to nasal cannula oxygen Cardiovascular status: blood pressure returned to baseline and stable Postop Assessment: no headache, no backache, no apparent nausea or vomiting and spinal receding Anesthetic complications: no    Last Vitals:  Vitals:   06/28/17 1528 06/28/17 1530  BP: 139/63   Pulse: 77 77  Resp: (!) 24 (!) 26  Temp:    SpO2: 92% 92%    Last Pain:  Vitals:   06/28/17 1429  TempSrc:   PainSc: Asleep                 Kijana Estock P Amad Mau

## 2017-06-29 DIAGNOSIS — T148XXA Other injury of unspecified body region, initial encounter: Secondary | ICD-10-CM

## 2017-06-29 LAB — COMPREHENSIVE METABOLIC PANEL
ALK PHOS: 47 U/L (ref 38–126)
ALT: 13 U/L — AB (ref 14–54)
AST: 44 U/L — AB (ref 15–41)
Albumin: 2.4 g/dL — ABNORMAL LOW (ref 3.5–5.0)
Anion gap: 4 — ABNORMAL LOW (ref 5–15)
BILIRUBIN TOTAL: 0.2 mg/dL — AB (ref 0.3–1.2)
BUN: 13 mg/dL (ref 6–20)
CHLORIDE: 107 mmol/L (ref 101–111)
CO2: 26 mmol/L (ref 22–32)
CREATININE: 0.99 mg/dL (ref 0.44–1.00)
Calcium: 7.7 mg/dL — ABNORMAL LOW (ref 8.9–10.3)
GFR calc Af Amer: 53 mL/min — ABNORMAL LOW (ref >60)
GFR, EST NON AFRICAN AMERICAN: 46 mL/min — AB (ref >60)
Glucose, Bld: 163 mg/dL — ABNORMAL HIGH (ref 65–99)
Potassium: 3.3 mmol/L — ABNORMAL LOW (ref 3.5–5.1)
Sodium: 137 mmol/L (ref 135–145)
Total Protein: 4.7 g/dL — ABNORMAL LOW (ref 6.5–8.1)

## 2017-06-29 LAB — CBC
HCT: 26 % — ABNORMAL LOW (ref 36.0–46.0)
HEMATOCRIT: 24.6 % — AB (ref 36.0–46.0)
HEMOGLOBIN: 8.1 g/dL — AB (ref 12.0–15.0)
Hemoglobin: 8.5 g/dL — ABNORMAL LOW (ref 12.0–15.0)
MCH: 29.1 pg (ref 26.0–34.0)
MCH: 29 pg (ref 26.0–34.0)
MCHC: 32.9 g/dL (ref 30.0–36.0)
MCHC: 32.7 g/dL (ref 30.0–36.0)
MCV: 88.5 fL (ref 78.0–100.0)
MCV: 88.7 fL (ref 78.0–100.0)
PLATELETS: 167 10*3/uL (ref 150–400)
PLATELETS: 178 10*3/uL (ref 150–400)
RBC: 2.78 MIL/uL — AB (ref 3.87–5.11)
RBC: 2.93 MIL/uL — ABNORMAL LOW (ref 3.87–5.11)
RDW: 13.5 % (ref 11.5–15.5)
RDW: 13.6 % (ref 11.5–15.5)
WBC: 10.2 10*3/uL (ref 4.0–10.5)
WBC: 14.6 10*3/uL — AB (ref 4.0–10.5)

## 2017-06-29 MED ORDER — ACETAMINOPHEN 500 MG PO TABS
1000.0000 mg | ORAL_TABLET | Freq: Four times a day (QID) | ORAL | Status: DC
  Administered 2017-06-29 – 2017-07-03 (×13): 1000 mg via ORAL
  Filled 2017-06-29 (×15): qty 2

## 2017-06-29 MED ORDER — POTASSIUM CHLORIDE CRYS ER 20 MEQ PO TBCR
40.0000 meq | EXTENDED_RELEASE_TABLET | Freq: Once | ORAL | Status: AC
  Administered 2017-06-29: 40 meq via ORAL
  Filled 2017-06-29: qty 2

## 2017-06-29 NOTE — Progress Notes (Signed)
Family member dropped off bilateral hearing aids. Pt wore them during the day. She now took them off and has placed them in her beside drawer.

## 2017-06-29 NOTE — NC FL2 (Signed)
[place] MEDICAID FL2 LEVEL OF CARE SCREENING TOOL     IDENTIFICATION  Patient Name: [name] Birthdate: 1916/08/14 Sex: female Admission Date (Current Location): 06/26/2017  [facility] [place]   [facility] and Address[facility]  [facility], [address], [place], [place] [address]      Provider Number: [id number]  Attending Physician Name and Address:  Cathren Harshai, Ripudeep K, MD  Relative Name and Phone Number:  Ambrose FinlandLinda Echavarria, daughter, [phone]    Current Level of Care: Hospital Recommended Level of Care: Skilled Nursing Facility Prior Approval Number:    Date Approved/Denied:   PASRR Number: [id number]  Discharge Plan: SNF    Current Diagnoses: Patient Active Problem List   Diagnosis Date Noted  . Fall at home, initial encounter 06/27/2017  . Hip fracture (HCC) 06/27/2017  . Aortic valve stenosis   . Displaced fracture of left femur (HCC) 06/26/2017  . C. difficile colitis 01/12/2015  . Partial small bowel obstruction (HCC)   . Adynamic ileus (HCC)   . Ileus (HCC)   . Acute encephalopathy 01/06/2015  . SBO (small bowel obstruction) (HCC) 01/05/2015  . Pancreatic mass 12/08/2013  . Poor prognosis 12/08/2013  . UTI (lower urinary tract infection) 10/15/2013  . Sepsis (HCC) 10/15/2013  . Septic shock (HCC) 10/15/2013  . Central retinal artery occlusion 06/09/2013  . Fracture of femoral neck, left (HCC) 10/06/2011  . Gait disorder 10/06/2011  . Strain of hip adductor muscle 10/06/2011  . Acute delirium 07/05/2011  . Fracture, femur (HCC) 07/02/2011  . Chest pain, unspecified   . Small bowel obstruction (HCC)   . Pacemaker   . Depression   . Complete heart block (HCC)   . Recurrent UTI   . SSS (sick sinus syndrome) (HCC)   . GERD (gastroesophageal reflux disease)   . DVT (deep venous thrombosis) (HCC)   . GOUT 07/06/2010  . Hypothyroidism 07/06/2010  . Hypokalemia 07/06/2010  . ANEMIA 07/06/2010  . Essential  hypertension 07/06/2010    Orientation RESPIRATION BLADDER Height & Weight     Self, Situation  O2(Nasal Cannula 2L) Incontinent, Indwelling catheter Weight:   Height:     BEHAVIORAL SYMPTOMS/MOOD NEUROLOGICAL BOWEL NUTRITION STATUS      Continent Diet(See DC Summary)  AMBULATORY STATUS COMMUNICATION OF NEEDS Skin   Extensive Assist Verbally Surgical wounds, Wound Vac                       Personal Care Assistance Level of Assistance  Dressing, Feeding, Bathing Bathing Assistance: Maximum assistance Feeding assistance: Limited assistance Dressing Assistance: Maximum assistance     Functional Limitations Info  Hearing, Sight, Speech Sight Info: Adequate Hearing Info: Impaired Speech Info: Adequate    SPECIAL CARE FACTORS FREQUENCY  PT (By licensed PT), OT (By licensed OT)     PT Frequency: 5x week OT Frequency: 5x week            Contractures Contractures Info: Not present    Additional Factors Info  Code Status, Allergies Code Status Info: Full code Allergies Info: ASPIRIN, ATIVAN LORAZEPAM, PENICILLINS            Current Medications (06/29/2017):  This is the current hospital active medication list Current Facility-Administered Medications  Medication Dose Route Frequency Provider Last Rate Last Dose  . 0.9 %  sodium chloride infusion   Intravenous Continuous Madelyn FlavorsSmith, Rondell A, MD 75 mL/hr at 06/29/17 0717    . acetaminophen (TYLENOL) tablet 1,000  mg  1,000 mg Oral Q6H Montez MoritaPaul, Keith, PA-C   1,000 mg at 06/29/17 1034  . allopurinol (ZYLOPRIM) tablet 100 mg  100 mg Oral Daily Katrinka BlazingSmith, Rondell A, MD   100 mg at 06/29/17 1035  . ALPRAZolam Prudy Feeler(XANAX) tablet 0.25 mg  0.25 mg Oral BID PRN Madelyn FlavorsSmith, Rondell A, MD   0.25 mg at 06/28/17 2134  . darifenacin (ENABLEX) 24 hr tablet 7.5 mg  7.5 mg Oral Daily Smith, Rondell A, MD   7.5 mg at 06/29/17 1034  . dextrose 5 %-0.9 % sodium chloride infusion   Intravenous Continuous Dimple NanasAmin, Ankit Chirag, MD 75 mL/hr at 06/28/17 0625    .  diltiazem (CARDIZEM CD) 24 hr capsule 180 mg  180 mg Oral Daily Smith, Rondell A, MD   180 mg at 06/29/17 1039  . enoxaparin (LOVENOX) injection 40 mg  40 mg Subcutaneous Q24H Montez Moritaaul, Keith, PA-C   40 mg at 06/29/17 1035  . folic acid (FOLVITE) tablet 1 mg  1 mg Oral Daily Smith, Rondell A, MD   1 mg at 06/29/17 1035  . furosemide (LASIX) tablet 20 mg  20 mg Oral Daily Katrinka BlazingSmith, Rondell A, MD   20 mg at 06/29/17 1035  . levothyroxine (SYNTHROID, LEVOTHROID) tablet 112 mcg  112 mcg Oral QAC breakfast Madelyn FlavorsSmith, Rondell A, MD   112 mcg at 06/29/17 1034  . metoCLOPramide (REGLAN) tablet 5-10 mg  5-10 mg Oral Q8H PRN Montez MoritaPaul, Keith, PA-C       Or  . metoCLOPramide (REGLAN) injection 5-10 mg  5-10 mg Intravenous Q8H PRN Montez MoritaPaul, Keith, PA-C      . metoprolol tartrate (LOPRESSOR) tablet 12.5 mg  12.5 mg Oral BID Madelyn FlavorsSmith, Rondell A, MD   12.5 mg at 06/29/17 1034  . morphine 2 MG/ML injection 0.5 mg  0.5 mg Intravenous Q2H PRN Madelyn FlavorsSmith, Rondell A, MD   0.5 mg at 06/28/17 0629  . ondansetron (ZOFRAN) tablet 4 mg  4 mg Oral Q6H PRN Montez MoritaPaul, Keith, PA-C       Or  . ondansetron [facility](ZOFRAN) injection 4 mg  4 mg Intravenous Q6H PRN Montez MoritaPaul, Keith, PA-C      . potassium chloride SA (K-DUR,KLOR-CON) CR tablet 40 mEq  40 mEq Oral Once Rai, Delene Ruffiniipudeep K, MD         Discharge Medications: Please see discharge summary for a list of discharge medications.  Relevant Imaging Results:  Relevant Lab Results:   Additional Information SS#:[id number]  Tresa MoorePatricia V Shamell Hittle, LCSW

## 2017-06-29 NOTE — Evaluation (Addendum)
Physical Therapy Evaluation Patient Details Name: [name] MRN: [id number] DOB: 03/29/1917 Today's Date: 06/29/2017   History of Present Illness  83 yo female with known dementia who experienced a fall at home; xray showed displaced L hip fracture. Received L LE ORIF surgery 06/28/17, now NWB. PMH: hx pacemaker, hx DVT, gout, HTN, L THA   Clinical Impression  Noted HCT levels below  guideline recommendations, limited evaluation to low level tasks today. Patient received in bed with grandson present, who reports he is her POA and was able to give an extensive history of PLOF and assistive devices available to patient. Patient very HOH but very pleasant. Attempted rolling in bed, for which patient did require Max assist and was severely limited by pain; also attempted active exercise and ROM of L LE as well as attempted MMT of B LEs, however patient very pain limited and requiring Mod-Max assist to participate. Based on findings of today's evaluation, recommend SNF moving forward; educated grandson regarding differences between CIR and SNF, PT recommendation for SNF, also limitations of today's session due to low lab values. Grandson agreeable to all education provided today, requests to speak to social worker to get more information about local SNFs and available facilities. Patient left in bed with lab tech attending to patient, all questions and concerns addressed, all needs met.     Follow Up Recommendations SNF    Equipment Recommendations  None recommended by PT    Recommendations for Other Services OT consult;Speech consult     Precautions / Restrictions Precautions Precautions: Fall;ICD/Pacemaker Restrictions Weight Bearing Restrictions: Yes LLE Weight Bearing: Non weight bearing, posterior precautions L hip       Mobility  Bed Mobility Overal bed mobility: Needs Assistance Bed Mobility: Rolling Rolling: Max assist         General bed mobility comments:  limited by pain   Transfers Overall transfer level: Needs assistance               General transfer comment: DNT due to high pain levels with motion; also note HCT below [facility] guidlines cutoff preventing aggressive mobility   Ambulation/Gait             General Gait Details: DNT due to high pain levels with motion; also note HCT below [facility] guidlines cutoff preventing aggressive mobility   Stairs            Wheelchair Mobility    Modified Rankin (Stroke Patients Only)       Balance Overall balance assessment: History of Falls                                           Pertinent Vitals/Pain Pain Assessment: Faces Faces Pain Scale: Hurts little more Pain Descriptors / Indicators: Other (Comment)(patient unable to describe ) Pain Intervention(s): Limited activity within patient's tolerance;Monitored during session    Home Living Family/patient expects to be discharged to:: Private residence Living Arrangements: Alone;Other (Comment)(grandson comes to help and check on her multiple times during the day ) Available Help at Discharge: Family;Available 24 hours/day Type of Home: House Home Access: Ramped entrance     Home Layout: One level Home Equipment: Walker - 4 wheels;Bedside commode;Wheelchair - manual;Hospital bed;Shower seat Additional Comments: family is directly next door to patient     Prior Function Level of Independence: Independent with assistive device(s)                 Hand Dominance   Dominant Hand: Right    Extremity/Trunk Assessment   Upper Extremity Assessment Upper Extremity Assessment: Defer to OT evaluation    Lower Extremity Assessment Lower Extremity Assessment: Generalized weakness    Cervical / Trunk Assessment Cervical / Trunk Assessment: Kyphotic  Communication   Communication: HOH  Cognition Arousal/Alertness: Lethargic Behavior During Therapy: WFL for tasks assessed/performed Overall  Cognitive Status: History of cognitive impairments - at baseline                                        General Comments General comments (skin integrity, edema, etc.): grandson is POA, reports patient is very independent at baseline but he does help her through the day; note HCT levels below [facility] guidelines cut off limiting aggressive mobility today     Exercises     Assessment/Plan    PT Assessment Patient needs continued PT services  PT Problem List Decreased strength;Decreased mobility;Decreased coordination;Decreased activity tolerance;Decreased balance;Pain       PT Treatment Interventions DME instruction;Therapeutic activities;Gait training;Therapeutic exercise;Patient/family education;Balance training;Functional mobility training;Neuromuscular re-education;Manual techniques    PT Goals (Current goals can be found in the Care Plan section)  Acute Rehab PT Goals Patient Stated Goal: for patient to ultimately be able to go home  PT Goal Formulation: With family Time For Goal Achievement: 07/13/17 Potential to Achieve Goals: Fair    Frequency Min 3X/week   Barriers to discharge        Co-evaluation               AM-PAC PT "6 Clicks" Daily Activity  Outcome Measure Difficulty turning over in bed (including adjusting bedclothes, sheets and blankets)?: Unable Difficulty moving from lying on back to sitting on the side of the bed? : Unable Difficulty sitting down on and standing up from a chair with arms (e.g., wheelchair, bedside commode, etc,.)?: Unable Help needed moving to and from a bed to chair (including a wheelchair)?: Total Help needed walking in hospital room?: Total Help needed climbing 3-5 steps with a railing? : Total 6 Click Score: 6    End of Session   Activity Tolerance: Patient limited by pain Patient left: in bed;with family/visitor present   PT Visit Diagnosis: Muscle weakness (generalized) (M62.81);Unsteadiness on feet  (R26.81);History of falling (Z91.81);Difficulty in walking, not elsewhere classified (R26.2)    Time: 1105-1130 PT Time Calculation (min) (ACUTE ONLY): 25 min   Charges:   PT Evaluation $PT Eval Low Complexity: 1 Low PT Treatments $Self Care/Home Management: 8-22   PT G Codes:   PT G-Codes **NOT FOR INPATIENT CLASS** Functional Limitation: Mobility: Walking and moving around Mobility: Walking and Moving Around Current Status (G8978): At least 80 percent but less than 100 percent impaired, limited or restricted Mobility: Walking and Moving Around Goal Status (G8979): At least 60 percent but less than 80 percent impaired, limited or restricted      PT, DPT, CBIS  Supplemental Physical Therapist [place]     

## 2017-06-29 NOTE — Progress Notes (Signed)
[facility]                                                                              Patient Demographics  [name], is a 83 y.o. female, DOB - [birth year], ZOX:[id number]  Admit date - 06/26/2017   Admitting Physician Ankit Joline Maxcyhirag Amin, MD  Outpatient Primary MD for the patient is Barbie BannerWilson, Fred H, MD  Outpatient specialists:   LOS - 2  days   Medical records reviewed and are as summarized below:    Chief Complaint  Patient presents with  . Hip Injury       Brief summary   83 year old female with history of hypertension, hyperlipidemia, left hip arthritis status post replacement, complete heart block with pacemaker in place, hypothyroidism came to the hospital after a fall and found to have moderately displaced left femoral shaft fracture near her left hip processes.  Orthopedic consulted who plans are taken her to the OR tomorrow [facility].  Cardiology consulted given her extensive disease and also upon family request prior to making surgical decision.   Assessment & Plan    Principal Problem:   Displaced fracture of left femur (HCC) after mechanical fall -Orthopedics consulted, underwent open reduction internal fixation on 12/6, postop day #1 -Confused and somnolent today, agree with discontinuing narcotics, currently on Tylenol scheduled.  UA negative for UTI on admission -Once more alert and awake, will start physical therapy -DVT prophylaxis per orthopedics, currently on Lovenox  Active Problems: Acute blood loss anemia -Postop, hemoglobin 8.1 today, on admission 12.0  -Monitor CBC closely, will likely need packed RBC transfusion in a.m. if hemoglobin trending down  Acute encephalopathy in the setting of possible dementia -Follow closely, DC narcotics, on Tylenol, -No UTI  History of complete heart block status post pacemaker (placed in 2004, generator was changed in 2011) -Cardiology following, currently on Cardizem.   -  Outpatient follows with Dr. Johney FrameAllred.  Hypokalemia -Potassium 3.3, replaced  Hypothyroidism -Continue Synthroid  Essential hypertension -BP stable, continue Cardizem, Lasix, metoprolol  History of gout -Currently stable, no flare, continue allopurinol   Code Status: full  DVT Prophylaxis:  SCD Family Communication:   Disposition Plan: will need SNF  Time Spent in minutes   25 minutes  Procedures:  ORIF left femur   Consultants:   Ortho   Antimicrobials:      Medications  Scheduled Meds: . acetaminophen  1,000 mg Oral Q6H  . allopurinol  100 mg Oral Daily  . darifenacin  7.5 mg Oral Daily  . diltiazem  180 mg Oral Daily  . enoxaparin (LOVENOX) injection  40 mg Subcutaneous Q24H  . folic acid  1 mg Oral Daily  . furosemide  20 mg Oral Daily  . levothyroxine  112 mcg Oral QAC breakfast  . metoprolol tartrate  12.5 mg Oral BID  . potassium chloride  40 mEq Oral Once   Continuous Infusions: . sodium chloride 75 mL/hr at 06/29/17 0717  . dextrose 5 % and 0.9% NaCl 75 mL/hr at 06/28/17 0625   PRN Meds:.ALPRAZolam, metoCLOPramide **OR** metoCLOPramide (REGLAN) injection, morphine injection, ondansetron **OR** ondansetron (ZOFRAN) IV   Antibiotics  Anti-infectives (From admission, onward)   Start     Dose/Rate Route Frequency Ordered Stop   06/28/17 1800  clindamycin (CLEOCIN) IVPB 600 mg     600 mg 100 mL/hr over 30 Minutes Intravenous Every 6 hours 06/28/17 1642 06/29/17 0601   06/28/17 0740  ceFAZolin (ANCEF) IVPB 2g/100 mL premix     2 g 200 mL/hr over 30 Minutes [facility] 06/27/17 2003 06/28/17 1057        Subjective:   [name] was seen and examined.  Somnolent, able to open her eyes otherwise does not answer the questions.  No family at the bedside.  No fevers.  Objective:   Vitals:   06/28/17 1700 06/28/17 2208 06/29/17 0100 06/29/17 0703  BP: (!) 142/62 (!) 100/50  (!) 113/47  Pulse: 78 79  69  Resp: (!) 24  (!) 24  18  Temp: 97.8 F (36.6 C)  98.4 F (36.9 C) 97.7 F (36.5 C)  TempSrc: Oral  Axillary Oral  SpO2: 93% 100%  100%    Intake/Output Summary (Last 24 hours) at 06/29/2017 1150 Last data filed at 06/29/2017 0900 Gross per 24 hour  Intake 2805.75 ml  Output 1310 ml  Net 1495.75 ml     Wt Readings from Last 3 Encounters:  05/03/16 74.8 kg (164 lb 12.8 oz)  05/03/15 75.2 kg (165 lb 12.8 oz)  01/05/15 73.8 kg (162 lb 9.6 oz)     Exam  Physical Exam  General: somnolent, NAD  Eyes:   HEENT:    Cardiovascular: S1 S2 auscultated, 2/6 SEM, RRR   Respiratory: Clear to auscultation bilaterally, no wheezing, rales or rhonchi  Gastrointestinal: Soft, nontender, nondistended, + bowel sounds  Ext: no pedal edema bilaterally  Neuro: unable to assess  Musculoskeletal: No digital cyanosis, clubbing  Skin: No rashes  Psych: somnolent   Data Reviewed:  I have personally reviewed following labs and imaging studies  Micro Results Recent Results (from the past 240 hour(s))  Surgical pcr screen     Status: None   Collection Time: 06/27/17  8:19 PM  Result Value Ref Range Status   MRSA, PCR NEGATIVE NEGATIVE Final   Staphylococcus aureus NEGATIVE NEGATIVE Final    Comment: (NOTE) The Xpert SA Assay (FDA approved for NASAL specimens in patients 81 years of age and older), is one component of a comprehensive surveillance program. It is not intended to diagnose infection nor to guide or monitor treatment.     Radiology Reports Dg Chest 1 View  Result Date: 06/26/2017 CLINICAL DATA:  83 year old female presents after fall. Left leg pain and swelling. EXAM: CHEST 1 VIEW COMPARISON:  10/15/2013 CXR FINDINGS: The heart size and mediastinal contours are within normal limits. Aortic atherosclerosis at the arch without aneurysm. Left-sided pacemaker apparatus with right atrial and right ventricular leads are again noted without change. Both lungs are clear. The visualized  skeletal structures are unremarkable. IMPRESSION: No active disease.  Aortic atherosclerosis. Electronically Signed   By: Tollie Ethavid  Kwon M.D.   On: 06/26/2017 19:41   Dg C-arm Gt 120 Min  Result Date: 06/28/2017 CLINICAL DATA:  ORIF left femur. EXAM: DG C-ARM GT 120 MIN; LEFT FEMUR 2 VIEWS CONTRAST:  None. FLUOROSCOPY TIME:  Fluoroscopy Time:  0 minutes 35 seconds Number of Acquired Spot Images: 9 COMPARISON:  None. FINDINGS: Postsurgical changes left femur. Hardware intact. Anatomic alignment. IMPRESSION: Postsurgical changes left femur.  Anatomic alignment. Electronically Signed   By: Maisie Fushomas  Register   On: 06/28/2017  13:50   Dg Femur Min 2 Views Left  Result Date: 06/28/2017 CLINICAL DATA:  ORIF left femur. EXAM: DG C-ARM GT 120 MIN; LEFT FEMUR 2 VIEWS CONTRAST:  None. FLUOROSCOPY TIME:  Fluoroscopy Time:  0 minutes 35 seconds Number of Acquired Spot Images: 9 COMPARISON:  None. FINDINGS: Postsurgical changes left femur. Hardware intact. Anatomic alignment. IMPRESSION: Postsurgical changes left femur.  Anatomic alignment. Electronically Signed   By: Maisie Fus  Register   On: 06/28/2017 13:50   Dg Femur Min 2 Views Left  Result Date: 06/26/2017 CLINICAL DATA:  Left leg pain after fall today. EXAM: LEFT FEMUR 2 VIEWS COMPARISON:  Radiograph of July 02, 2011. FINDINGS: Status post left hemiarthroplasty. Moderately displaced fracture of left femur is noted with slightly overriding fracture fragments. Fracture begins at distal tip of prosthesis. IMPRESSION: Moderately displaced left femoral shaft fracture is noted with slightly overriding fracture fragments. Proximal portion of fracture begins at distal tip of left hip prosthesis. Electronically Signed   By: Lupita Raider, M.D.   On: 06/26/2017 19:40   Dg Femur Port Min 2 Views Left  Result Date: 06/28/2017 CLINICAL DATA:  Initial evaluation status post ORIF of left femur. EXAM: LEFT FEMUR PORTABLE 2 VIEWS COMPARISON:  Prior radiograph from 06/26/2017.  FINDINGS: Postoperative changes from recent ORIF of left femoral fracture seen. Malleable plate screw fixation with associated cerclage wires. Fracture in normal anatomic alignment. Hardware appears well positioned without complication. Postoperative swelling with emphysema present within the left thigh. Left hip prosthesis again noted. IMPRESSION: Postoperative changes from recent ORIF for left femoral fracture. Electronically Signed   By: Rise Mu M.D.   On: 06/28/2017 15:15    Lab Data:  CBC: Recent Labs  Lab 06/26/17 1925 06/27/17 0655 06/28/17 0609 06/29/17 0512  WBC 17.8* 10.6* 11.5* 10.2  NEUTROABS 15.6*  --   --   --   HGB 13.0 12.0 11.2* 8.1*  HCT 38.7 34.6* 34.1* 24.6*  MCV 87.2 87.4 87.7 88.5  PLT 245 214 216 167   Basic Metabolic Panel: Recent Labs  Lab 06/26/17 1925 06/27/17 0655 06/28/17 0609 06/29/17 0512  NA 139 139 137 137  K 3.3* 3.6 3.2* 3.3*  CL 104 109 104 107  CO2 25 24 23 26   GLUCOSE 131* 120* 146* 163*  BUN 15 13 10 13   CREATININE 0.88 0.76 0.67 0.99  CALCIUM 8.6* 8.1* 8.0* 7.7*  MG  --  2.2  --   --    GFR: CrCl cannot be calculated (Unknown ideal weight.). Liver Function Tests: Recent Labs  Lab 06/28/17 0609 06/29/17 0512  AST 35 44*  ALT 12* 13*  ALKPHOS 63 47  BILITOT 0.8 0.2*  PROT 6.2* 4.7*  ALBUMIN 3.1* 2.4*   No results for input(s): LIPASE, AMYLASE in the last 168 hours. No results for input(s): AMMONIA in the last 168 hours. Coagulation Profile: Recent Labs  Lab 06/26/17 2119 06/28/17 0609  INR 1.14 1.22   Cardiac Enzymes: No results for input(s): CKTOTAL, CKMB, CKMBINDEX, TROPONINI in the last 168 hours. BNP (last 3 results) No results for input(s): PROBNP in the last 8760 hours. HbA1C: No results for input(s): HGBA1C in the last 72 hours. CBG: Recent Labs  Lab 06/26/17 1842 06/28/17 1753  GLUCAP 112* 134*   Lipid Profile: No results for input(s): CHOL, HDL, LDLCALC, TRIG, CHOLHDL, LDLDIRECT in the  last 72 hours. Thyroid Function Tests: No results for input(s): TSH, T4TOTAL, FREET4, T3FREE, THYROIDAB in the last 72 hours. Anemia Panel: No  results for input(s): VITAMINB12, FOLATE, FERRITIN, TIBC, IRON, RETICCTPCT in the last 72 hours. Urine analysis:    Component Value Date/Time   COLORURINE YELLOW 06/27/2017 0611   APPEARANCEUR HAZY (A) 06/27/2017 0611   LABSPEC 1.009 06/27/2017 0611   PHURINE 9.0 (H) 06/27/2017 0611   GLUCOSEU NEGATIVE 06/27/2017 0611   HGBUR NEGATIVE 06/27/2017 0611   BILIRUBINUR NEGATIVE 06/27/2017 0611   KETONESUR 5 (A) 06/27/2017 0611   PROTEINUR NEGATIVE 06/27/2017 0611   UROBILINOGEN 1.0 01/05/2015 1427   NITRITE NEGATIVE 06/27/2017 0611   LEUKOCYTESUR NEGATIVE 06/27/2017 1610     Ripudeep Rai M.D. [facility] 06/29/2017, 11:50 AM  Pager: [phone] Between 7am to 7pm - call Pager - [phone]  After 7pm go to [email or web address] - password [email or web address]  Call night coverage person covering after 7pm

## 2017-06-29 NOTE — Progress Notes (Signed)
OT Cancellation Note  Patient Details Name: [name] MRN: [id number] DOB: 1916-10-22   Cancelled Treatment:    Reason Eval/Treat Not Completed: Fatigue/lethargy limiting ability to participate. Pt fatigued and getting ready to eat lunch, RN in with pt  Galen ManilaSpencer, Danell Verno Jeanette 06/29/2017, 1:44 PM

## 2017-06-29 NOTE — Clinical Social Work Note (Signed)
Clinical Social Work Assessment  Patient Details  Name: [name] MRN: [id number] Date of Birth: Mar 07, 1917  Date of referral:  06/29/17               Reason for consult:  Facility Placement                Permission sought to share information with:  Chartered certified accountant granted to share information::  Yes, Verbal Permission Granted  Name::     [name]"  Agency::  [facility]  Relationship::  grandson  Contact Information:     Housing/Transportation Living arrangements for the past 2 months:  Apartment Source of Information:  Other (Comment Required)(grandson) Patient Interpreter Needed:  None Criminal Activity/Legal Involvement Pertinent to Current Situation/Hospitalization:  No - Comment as needed Significant Relationships:  Adult Children, Other Family Members Lives with:  Self Do you feel safe going back to the place where you live?  No Need for family participation in patient care:  Yes (Comment)  Care giving concerns:  CSW met with patient and grandson at bedside. Pt from home alone, however grandson lives next door and other family are in close proximity. Grandson indicates that her helps her with ADL's during the day and stays there in the evening with her. Pt ambulated with walker prior to impairment. Pt has been to [facility] in the past as her primary care doctor's office works([name], Dr. Kathryne [name], [place], [place]) with the facility and has gotten her into SNF in the past.   Social Worker Assessment / plan: CSW explained her role and discussed SNf options and placement. CSW will set up transportation when ready.  CSW will f/u for disposition.  Employment status:  Retired Forensic scientist:  Medicare PT Recommendations:  [place] / Referral to community resources:  [place]  Patient/Family's Response to care:  Family appreciative of CSW assisting with SNF  process. No concerns and issues.  Patient/Family's Understanding of and Emotional Response to Diagnosis, Current Treatment, and Prognosis:  Family has good understanding of diagnosis, current treatment and prognosis as they are amenable to SNF placement and they know that with new impairment she cannot be cared for at home.  Pt will return home after physical therapy. CSW will continue to follow. No issues or concerns identified.  Emotional Assessment Appearance:  Appears stated age Attitude/Demeanor/Rapport:  (appropriate) Affect (typically observed):  Accepting, Appropriate Orientation:  Oriented to Self, Oriented to Situation, Oriented to Place Alcohol / Substance use:  Not Applicable Psych involvement (Current and /or in the community):  No (Comment)  Discharge Needs  Concerns to be addressed:  Decision making concerns Readmission within the last 30 days:  No Current discharge risk:  Physical Impairment, Dependent with Mobility Barriers to Discharge:  No Barriers Identified   Cindy Baxter, LCSW 06/29/2017, 1:04 PM

## 2017-06-29 NOTE — Progress Notes (Signed)
Orthopedic Trauma Service Progress Note   Patient ID: Mammie Lorenzodna R Dante MRN: [id number] DOB/AGE: 81/12/1916 81 y.o.  Subjective:  Sleepy Confused, doesn't know where she is at Very hard to understand pt. Pt clearly not able to hear my instructions    Review of Systems  Unable to perform ROS: Dementia    Objective:   VITALS:   Vitals:   06/28/17 1700 06/28/17 2208 06/29/17 0100 06/29/17 0703  BP: (!) 142/62 (!) 100/50  (!) 113/47  Pulse: 78 79  69  Resp: (!) 24 (!) 24  18  Temp: 97.8 F (36.6 C)  98.4 F (36.9 C) 97.7 F (36.5 C)  TempSrc: Oral  Axillary Oral  SpO2: 93% 100%  100%    Estimated body mass index is 28.29 kg/m as calculated from the following:   Height as of 05/03/16: 5\' 4"  (1.626 m).   Weight as of 05/03/16: 74.8 kg (164 lb 12.8 oz).   Intake/Output      12/06 0701 - 12/07 0700 12/07 0701 - 12/08 0700   P.O.     I.V. 3655.8    IV Piggyback 150    Total Intake 3805.8    Urine 1000    Drains 60    Blood 450    Total Output 1510    Net +2295.8           LABS  Results for orders placed or performed during the hospital encounter of 06/26/17 (from the past 24 hour(s))  Glucose, capillary     Status: Abnormal   Collection Time: 06/28/17  5:53 PM  Result Value Ref Range   Glucose-Capillary 134 (H) 65 - 99 mg/dL  CBC     Status: Abnormal   Collection Time: 06/29/17  5:12 AM  Result Value Ref Range   WBC 10.2 4.0 - 10.5 K/uL   RBC 2.78 (L) 3.87 - 5.11 MIL/uL   Hemoglobin 8.1 (L) 12.0 - 15.0 g/dL   HCT 11.924.6 (L) 14.736.0 - 82.946.0 %   MCV 88.5 78.0 - 100.0 fL   MCH 29.1 26.0 - 34.0 pg   MCHC 32.9 30.0 - 36.0 g/dL   RDW 56.213.5 13.011.5 - 86.515.5 %   Platelets 167 150 - 400 K/uL  Comprehensive metabolic panel     Status: Abnormal   Collection Time: 06/29/17  5:12 AM  Result Value Ref Range   Sodium 137 135 - 145 mmol/L   Potassium 3.3 (L) 3.5 - 5.1 mmol/L   Chloride 107 101 - 111 mmol/L   CO2 26 22 - 32 mmol/L   Glucose, Bld 163 (H)  65 - 99 mg/dL   BUN 13 6 - 20 mg/dL   Creatinine, Ser 7.840.99 0.44 - 1.00 mg/dL   Calcium 7.7 (L) 8.9 - 10.3 mg/dL   Total Protein 4.7 (L) 6.5 - 8.1 g/dL   Albumin 2.4 (L) 3.5 - 5.0 g/dL   AST 44 (H) 15 - 41 U/L   ALT 13 (L) 14 - 54 U/L   Alkaline Phosphatase 47 38 - 126 U/L   Total Bilirubin 0.2 (L) 0.3 - 1.2 mg/dL   GFR calc non Af Amer 46 (L) >60 mL/min   GFR calc Af Amer 53 (L) >60 mL/min   Anion gap 4 (L) 5 - 15     PHYSICAL EXAM:   Gen: sleeping but arousable, edentulous  Lungs: clear anterior fields Cardiac: s1 and s2 Abd: + BS, NTND Ext:       Left Lower Extremity   TED  hose on   Prevena vac functioning, good seal  Hard to assess motor or sensory function but pt is moving foot and ankle   Ext cool   + DP pulse  Good color   Swelling as expected    Assessment/Plan: 1 Day Post-Op   Principal Problem:   Displaced fracture of left femur (HCC) Active Problems:   Pacemaker   Complete heart block (HCC)   GOUT   Hypothyroidism   Hypokalemia   Fall at home, initial encounter   Hip fracture (HCC)   Anti-infectives (From admission, onward)   Start     Dose/Rate Route Frequency Ordered Stop   06/28/17 1800  clindamycin (CLEOCIN) IVPB 600 mg     600 mg 100 mL/hr over 30 Minutes Intravenous Every 6 hours 06/28/17 1642 06/29/17 0601   06/28/17 0740  ceFAZolin (ANCEF) IVPB 2g/100 mL premix     2 g 200 mL/hr over 30 Minutes Intravenous To [facility] 06/27/17 2003 06/28/17 1057    .  POD/HD#: 1  83 y/o female with numerous medical comorbidities s/p fall with L periprosthetic femur fracture   - fall   -L periprosthetic femur fracture s/p ORIF  NWB x 8 weeks  PT and OT evals   Unrestricted ROM L knee   Pt does have L total hip replacement   Continue with Prevena until dc   - Pain management:  Will dc norco  Scheduled tylenol   Narcotics likely contributing to somnolence and increased confusion. Uncertain what her baseline is as there is no family at  bedside  - ABL anemia/Hemodynamics  H/H trending down  Check cbc this afternoon. Suspect she will need PRBC's given magnitude of injury and surgery    - Medical issues   Per medical service   Chronic medical issues   Hypokalemia    Replacement   - DVT/PE prophylaxis:  Lovenox x 28 days post op  - ID:   Periop abx   - Metabolic Bone Disease:  Check vitamin d levels   - Activity:   NWB L leg   PT/OT evals  - FEN/GI prophylaxis/Foley/Lines:  ST consult for swallow eval   - Impediments to fracture healing:  Dementia   - Dispo:  PT/OT evals  SW consult for SNF placement      Mearl LatinKeith W. Jack Mineau, PA-C [facility] [phone] (P) [phone] Traci Sermon(O) [phone] (C) 06/29/2017, 8:48 AM

## 2017-06-29 NOTE — Op Note (Signed)
NAMChristain [place]:  Cupples, Laura-Lee              ACCOUNT NO.:  [id number]  MEDICAL RECORD NO.:  [id number]  LOCATION:  WA23                         [facility]  PHYSICIAN:  Doralee AlbinoMichael H. Carola FrostHandy, M.D. DATE OF BIRTH:  11-06-1916  DATE OF PROCEDURE:  06/28/2017 DATE OF DISCHARGE:                              OPERATIVE REPORT   PREOPERATIVE DIAGNOSIS:  Left periprosthetic femoral shaft fracture.  POSTOPERATIVE DIAGNOSIS:  Left periprosthetic femoral shaft fracture.  PROCEDURE:  Open reduction and internal fixation of left periprosthetic femoral shaft fracture.  SURGEON:  Doralee AlbinoMichael H. Carola FrostHandy, MD.  ASSISTANTS: 1. Montez MoritaKeith Paul, PA-C. 2. PA student.  ANESTHESIA:  General.  COMPLICATIONS:  None.  ESTIMATED BLOOD LOSS:  450 mL.  DISPOSITION:  To PACU.  CONDITION:  Stable.  BRIEF SUMMARY OF INDICATION FOR PROCEDURE:  The patient is a very pleasant 83 year old female, who sustained a ground-level fall resulting in a severely displaced and shortened left femur fracture around the tip of a hemiarthroplasty.  I discussed with the patient and her son the risks and benefits of surgical repair including potential for heart attack, stroke, bleeding, infection, nerve injury, vessel injury, malunion, nonunion, symptomatic hardware, and need for further surgery among others.  Furthermore, we did obtain consultation with our Cardiology colleagues and get clearance prior to proceeding because of her history of arrhythmia, aortic stenosis, pacemaker, hypertension and she was also admitted to the Medical Service where she was optimized for additional concerns.  She does have a history of DVT as well.  The patient's family wished to proceed.  BRIEF SUMMARY OF PROCEDURE:  The patient was taken to the operating room where general anesthesia was induced.  She did receive preoperative antibiotics.  Her left lower extremity was prepped and draped in usual sterile fashion.  No tourniquet was used during the  procedure.  C-arm was brought in to confirm the appropriate position for the incision such that we could span the length of the bone with an implant.  The incision was then made and dissection carried down to the IT band, was split in line with the incision.  The vastus lateralis was elevated up carefully, watching for perforating vessels, controlling them with electrocautery before division, placing Bennett retractors and minimizing the stripping.  The fracture again was severely displaced and shortened and it was cleaned with a curette and lavaged.  My assistant then pulled longitudinal traction and derotation while the second assist held retraction to allow for visualization of the fracture.  This was clamped provisionally under direct visualization with a lobster claw clamp and then this exchange with a 20-gauge wire.  The plate was then measured, contoured distally to allow for the flare of the supracondylar femur and placed such that it would allow for adequate proximal fixation and additional distal fixation.  Three screws from the [employer] NCB plating system were used both proximally and distally outstanding reduction and good bite was obtained with all screws.  A single unicortical screw was placed proximally in the trochanteric region and then a cerclage wire with an eyelet in the proximal segment as well.  Final AP and lateral images showed excellent reduction, hardware placement, trajectory, and length.  Wound was irrigated thoroughly.  Instruments were withdrawn and standard layered closure performed using #1 Vicryl, 0 Vicryl, 2-0 Vicryl, and 2-0 nylon for the skin.  Sterile gently compressive dressing was applied.  The patient was awakened from anesthesia and transported to PACU in stable condition.  PROGNOSIS:  The patient will be nonweightbearing on the left lower extremity with unrestricted range of motion of the knee.  She will have continued posterior precautions, which  extent from her initial arthroplasty operation.  She will resume DVT prophylaxis and we would expect a nursing home skilled facility stay after discharge.  The patient does live alone, though her son lives next door and is her regular caretaker.  She remains at elevated risk of complications given her strong cardiac history and elevated age.     Doralee AlbinoMichael H. Carola FrostHandy, M.D.     MHH/MEDQ  D:  06/28/2017  T:  06/29/2017  Job:  [id number]

## 2017-06-30 LAB — CBC
HCT: 24.7 % — ABNORMAL LOW (ref 36.0–46.0)
Hemoglobin: 8.2 g/dL — ABNORMAL LOW (ref 12.0–15.0)
MCH: 29.4 pg (ref 26.0–34.0)
MCHC: 33.2 g/dL (ref 30.0–36.0)
MCV: 88.5 fL (ref 78.0–100.0)
PLATELETS: 174 10*3/uL (ref 150–400)
RBC: 2.79 MIL/uL — ABNORMAL LOW (ref 3.87–5.11)
RDW: 13.6 % (ref 11.5–15.5)
WBC: 10.9 10*3/uL — ABNORMAL HIGH (ref 4.0–10.5)

## 2017-06-30 LAB — BASIC METABOLIC PANEL
Anion gap: 8 (ref 5–15)
BUN: 10 mg/dL (ref 6–20)
CALCIUM: 7.8 mg/dL — AB (ref 8.9–10.3)
CO2: 22 mmol/L (ref 22–32)
CREATININE: 0.63 mg/dL (ref 0.44–1.00)
Chloride: 107 mmol/L (ref 101–111)
GFR calc Af Amer: >60 mL/min (ref >60)
GLUCOSE: 144 mg/dL — AB (ref 65–99)
Potassium: 3.9 mmol/L (ref 3.5–5.1)
Sodium: 137 mmol/L (ref 135–145)

## 2017-06-30 MED ORDER — POLYETHYLENE GLYCOL 3350 17 G PO PACK: 17.0000 g | PACK | Freq: Every day | ORAL | Status: DC | PRN

## 2017-06-30 MED ORDER — SENNOSIDES-DOCUSATE SODIUM 8.6-50 MG PO TABS
1.0000 | ORAL_TABLET | Freq: Two times a day (BID) | ORAL | Status: DC
  Administered 2017-06-30 – 2017-07-03 (×6): 1 via ORAL
  Filled 2017-06-30 (×7): qty 1

## 2017-06-30 NOTE — Progress Notes (Signed)
[facility]                                                                              Patient Demographics  [name], is a 83 y.o. female, DOB - 23-Dec-1916, ZOX:[id number]  Admit date - 06/26/2017   Admitting Physician Ankit Joline Maxcy, MD  Outpatient Primary MD for the patient is Barbie Banner, MD  Outpatient specialists:   LOS - 3  days   Medical records reviewed and are as summarized below:    Chief Complaint  Patient presents with  . Hip Injury       Brief summary   83 year old female with history of hypertension, hyperlipidemia, left hip arthritis status post replacement, complete heart block with pacemaker in place, hypothyroidism came to the hospital after a fall and found to have moderately displaced left femoral shaft fracture near her left hip processes.  Orthopedic consulted who plans are taken her to the OR tomorrow at [facility].  Cardiology consulted given her extensive disease and also upon family request prior to making surgical decision.   Assessment & Plan    Principal Problem:   Displaced fracture of left femur (HCC) after mechanical fall -Orthopedics consulted, underwent open reduction internal fixation on 12/6, postop day #1 -Much more alert and awake today, aquatics were discontinued, continue Tylenol.  No UTI  -Start PT, per orthopedics, NWB for 8 weeks, continue prevena until discharge  -Lovenox 28 days postop  Active Problems: Acute blood loss anemia -Hemoglobin 8.2 today, monitor closely, may need packed RBC transfusion if still trending down less than 8  Acute encephalopathy in the setting of possible dementia -Much more alert and awake, oriented.  Continue Tylenol, no narcotics.   -No UTI  History of complete heart block status post pacemaker (placed in 2004, generator was changed in 2011) -Cardiology following, currently on Cardizem.   - Outpatient follows with Dr. Johney Frame.  Hypokalemia -Improved, 3.9  today, continue potassium replacement  Hypothyroidism -Continue Synthroid  Essential hypertension -BP stable, continue Cardizem, Lasix, metoprolol  History of gout -Currently stable, no flare, continue allopurinol  Constipation -Added Senokot-S, MiraLAX   Code Status: full  DVT Prophylaxis:  SCD Family Communication:   Disposition Plan: will need SNF, social work consult placed  Time Spent in minutes   25 minutes  Procedures:  ORIF left femur   Consultants:   Ortho   Antimicrobials:      Medications  Scheduled Meds: . acetaminophen  1,000 mg Oral Q6H  . allopurinol  100 mg Oral Daily  . darifenacin  7.5 mg Oral Daily  . diltiazem  180 mg Oral Daily  . enoxaparin (LOVENOX) injection  40 mg Subcutaneous Q24H  . folic acid  1 mg Oral Daily  . furosemide  20 mg Oral Daily  . levothyroxine  112 mcg Oral QAC breakfast  . metoprolol tartrate  12.5 mg Oral BID  . senna-docusate  1 tablet Oral BID   Continuous Infusions: . dextrose 5 % and 0.9% NaCl 75 mL/hr at 06/28/17 0625   PRN Meds:.ALPRAZolam, metoCLOPramide **OR** metoCLOPramide (REGLAN) injection, morphine injection, ondansetron **OR** ondansetron (ZOFRAN) IV, polyethylene glycol  Antibiotics   Anti-infectives (From admission, onward)   Start     Dose/Rate Route Frequency Ordered Stop   06/28/17 1800  clindamycin (CLEOCIN) IVPB 600 mg     600 mg 100 mL/hr over 30 Minutes Intravenous Every 6 hours 06/28/17 1642 06/29/17 0601   06/28/17 0740  ceFAZolin (ANCEF) IVPB 2g/100 mL premix     2 g 200 mL/hr over 30 Minutes [facility] 06/27/17 2003 06/28/17 1057        Subjective:   [name] was seen and examined.  Much more alert and awake, oriented today, pain controlled.  Denies any chest pain, nausea, vomiting, abdominal pain.  No fevers.  Objective:   Vitals:   06/29/17 0703 06/29/17 2020 06/30/17 0348 06/30/17 0939  BP: (!) 113/47 140/60 (!) 130/52   Pulse: 69 80 95    Resp: 18 15 16    Temp: 97.7 F (36.5 C) 98 F (36.7 C) 97.9 F (36.6 C)   TempSrc: Oral Oral Oral   SpO2: 100% 99% 100%   Weight:    74.4 kg (164 lb)  Height:    5\' 4"  (1.626 m)    Intake/Output Summary (Last 24 hours) at 06/30/2017 1311 Last data filed at 06/30/2017 1100 Gross per 24 hour  Intake 2408.75 ml  Output 601 ml  Net 1807.75 ml     Wt Readings from Last 3 Encounters:  06/30/17 74.4 kg (164 lb)  05/03/16 74.8 kg (164 lb 12.8 oz)  05/03/15 75.2 kg (165 lb 12.8 oz)     Exam General: Much more alert and awake today, oriented to self Eyes:  HEENT:  Atraumatic, normocephalic Cardiovascular: S1 S2 auscultated, 2/6 SEM regular rate and rhythm. No pedal edema b/l Respiratory: Clear to auscultation bilaterally, no wheezing, rales or rhonchi Gastrointestinal: Soft, nontender, nondistended, + bowel sounds Ext: no pedal edema bilaterally Neuro: no new deficits Musculoskeletal: No digital cyanosis, clubbing Skin: No rashes Psych: much more alert and awake today     Data Reviewed:  I have personally reviewed following labs and imaging studies  Micro Results Recent Results (from the past 240 hour(s))  Surgical pcr screen     Status: None   Collection Time: 06/27/17  8:19 PM  Result Value Ref Range Status   MRSA, PCR NEGATIVE NEGATIVE Final   Staphylococcus aureus NEGATIVE NEGATIVE Final    Comment: (NOTE) The Xpert SA Assay (FDA approved for NASAL specimens in patients 81 years of age and older), is one component of a comprehensive surveillance program. It is not intended to diagnose infection nor to guide or monitor treatment.     Radiology Reports Dg Chest 1 View  Result Date: 06/26/2017 CLINICAL DATA:  83 year old female presents after fall. Left leg pain and swelling. EXAM: CHEST 1 VIEW COMPARISON:  10/15/2013 CXR FINDINGS: The heart size and mediastinal contours are within normal limits. Aortic atherosclerosis at the arch without aneurysm. Left-sided  pacemaker apparatus with right atrial and right ventricular leads are again noted without change. Both lungs are clear. The visualized skeletal structures are unremarkable. IMPRESSION: No active disease.  Aortic atherosclerosis. Electronically Signed   By: Tollie Ethavid  Kwon M.D.   On: 06/26/2017 19:41   Dg C-arm Gt 120 Min  Result Date: 06/28/2017 CLINICAL DATA:  ORIF left femur. EXAM: DG C-ARM GT 120 MIN; LEFT FEMUR 2 VIEWS CONTRAST:  None. FLUOROSCOPY TIME:  Fluoroscopy Time:  0 minutes 35 seconds Number of Acquired Spot Images: 9 COMPARISON:  None. FINDINGS: Postsurgical changes left femur. Hardware  intact. Anatomic alignment. IMPRESSION: Postsurgical changes left femur.  Anatomic alignment. Electronically Signed   By: Maisie Fushomas  Register   On: 06/28/2017 13:50   Dg Femur Min 2 Views Left  Result Date: 06/28/2017 CLINICAL DATA:  ORIF left femur. EXAM: DG C-ARM GT 120 MIN; LEFT FEMUR 2 VIEWS CONTRAST:  None. FLUOROSCOPY TIME:  Fluoroscopy Time:  0 minutes 35 seconds Number of Acquired Spot Images: 9 COMPARISON:  None. FINDINGS: Postsurgical changes left femur. Hardware intact. Anatomic alignment. IMPRESSION: Postsurgical changes left femur.  Anatomic alignment. Electronically Signed   By: Maisie Fushomas  Register   On: 06/28/2017 13:50   Dg Femur Min 2 Views Left  Result Date: 06/26/2017 CLINICAL DATA:  Left leg pain after fall today. EXAM: LEFT FEMUR 2 VIEWS COMPARISON:  Radiograph of July 02, 2011. FINDINGS: Status post left hemiarthroplasty. Moderately displaced fracture of left femur is noted with slightly overriding fracture fragments. Fracture begins at distal tip of prosthesis. IMPRESSION: Moderately displaced left femoral shaft fracture is noted with slightly overriding fracture fragments. Proximal portion of fracture begins at distal tip of left hip prosthesis. Electronically Signed   By: Lupita RaiderJames  Green Jr, M.D.   On: 06/26/2017 19:40   Dg Femur Port Min 2 Views Left  Result Date: 06/28/2017 CLINICAL  DATA:  Initial evaluation status post ORIF of left femur. EXAM: LEFT FEMUR PORTABLE 2 VIEWS COMPARISON:  Prior radiograph from 06/26/2017. FINDINGS: Postoperative changes from recent ORIF of left femoral fracture seen. Malleable plate screw fixation with associated cerclage wires. Fracture in normal anatomic alignment. Hardware appears well positioned without complication. Postoperative swelling with emphysema present within the left thigh. Left hip prosthesis again noted. IMPRESSION: Postoperative changes from recent ORIF for left femoral fracture. Electronically Signed   By: Rise MuBenjamin  McClintock M.D.   On: 06/28/2017 15:15    Lab Data:  CBC: Recent Labs  Lab 06/26/17 1925 06/27/17 0655 06/28/17 0609 06/29/17 0512 06/29/17 1117 06/30/17 0521  WBC 17.8* 10.6* 11.5* 10.2 14.6* 10.9*  NEUTROABS 15.6*  --   --   --   --   --   HGB 13.0 12.0 11.2* 8.1* 8.5* 8.2*  HCT 38.7 34.6* 34.1* 24.6* 26.0* 24.7*  MCV 87.2 87.4 87.7 88.5 88.7 88.5  PLT 245 214 216 167 178 174   Basic Metabolic Panel: Recent Labs  Lab 06/26/17 1925 06/27/17 0655 06/28/17 0609 06/29/17 0512 06/30/17 0521  NA 139 139 137 137 137  K 3.3* 3.6 3.2* 3.3* 3.9  CL 104 109 104 107 107  CO2 25 24 23 26 22   GLUCOSE 131* 120* 146* 163* 144*  BUN 15 13 10 13 10   CREATININE 0.88 0.76 0.67 0.99 0.63  CALCIUM 8.6* 8.1* 8.0* 7.7* 7.8*  MG  --  2.2  --   --   --    GFR: Estimated Creatinine Clearance: 38.8 mL/min (by C-G formula based on SCr of 0.63 mg/dL). Liver Function Tests: Recent Labs  Lab 06/28/17 0609 06/29/17 0512  AST 35 44*  ALT 12* 13*  ALKPHOS 63 47  BILITOT 0.8 0.2*  PROT 6.2* 4.7*  ALBUMIN 3.1* 2.4*   No results for input(s): LIPASE, AMYLASE in the last 168 hours. No results for input(s): AMMONIA in the last 168 hours. Coagulation Profile: Recent Labs  Lab 06/26/17 2119 06/28/17 0609  INR 1.14 1.22   Cardiac Enzymes: No results for input(s): CKTOTAL, CKMB, CKMBINDEX, TROPONINI in the last  168 hours. BNP (last 3 results) No results for input(s): PROBNP in the last 8760 hours.  HbA1C: No results for input(s): HGBA1C in the last 72 hours. CBG: Recent Labs  Lab 06/26/17 1842 06/28/17 1753  GLUCAP 112* 134*   Lipid Profile: No results for input(s): CHOL, HDL, LDLCALC, TRIG, CHOLHDL, LDLDIRECT in the last 72 hours. Thyroid Function Tests: No results for input(s): TSH, T4TOTAL, FREET4, T3FREE, THYROIDAB in the last 72 hours. Anemia Panel: No results for input(s): VITAMINB12, FOLATE, FERRITIN, TIBC, IRON, RETICCTPCT in the last 72 hours. Urine analysis:    Component Value Date/Time   COLORURINE YELLOW 06/27/2017 0611   APPEARANCEUR HAZY (A) 06/27/2017 0611   LABSPEC 1.009 06/27/2017 0611   PHURINE 9.0 (H) 06/27/2017 0611   GLUCOSEU NEGATIVE 06/27/2017 0611   HGBUR NEGATIVE 06/27/2017 0611   BILIRUBINUR NEGATIVE 06/27/2017 0611   KETONESUR 5 (A) 06/27/2017 0611   PROTEINUR NEGATIVE 06/27/2017 0611   UROBILINOGEN 1.0 01/05/2015 1427   NITRITE NEGATIVE 06/27/2017 0611   LEUKOCYTESUR NEGATIVE 06/27/2017 1610     Lindy Garczynski M.D. [facility] 06/30/2017, 1:11 PM  Pager: [phone] Between 7am to 7pm - call Pager - [phone]  After 7pm go to [email or web address] - password [email or web address]  Call night coverage person covering after 7pm

## 2017-06-30 NOTE — Evaluation (Signed)
Occupational Therapy Evaluation Patient Details Name: [name] MRN: [id number] DOB: 1917/03/05 Today's Date: 06/30/2017    History of Present Illness 83 yo female with known dementia who experienced a fall at home; xray showed displaced L hip fracture. Received L LE ORIF surgery 06/28/17, now NWB. PMH: hx pacemaker, hx DVT, gout, HTN, L THA    Clinical Impression   PTA, pt was living alone and performing her BADLs. Pt's grandson checks in each day and performs IADLs. Pt currently requiring Max A +2 for LB ADLs, toileting, and functional transfers. Pt unable to maintain NWB status without physical A. Pt would benefit from further acute OT to facilitate safe dc. Recommend dc to SNF for further OT to optimize safety and independence with ADLs and functional mobility.    Follow Up Recommendations  SNF    Equipment Recommendations  Other (comment)(Defer to next venue)    Recommendations for Other Services PT consult     Precautions / Restrictions Precautions Precautions: Fall;ICD/Pacemaker;Posterior Hip Precaution Comments: Reviewed posterior hip precautions Restrictions Weight Bearing Restrictions: No LLE Weight Bearing: Non weight bearing Other Position/Activity Restrictions: posterior hip precautions LLE      Mobility Bed Mobility Overal bed mobility: Needs Assistance Bed Mobility: Supine to Sit     Supine to sit: +2 for physical assistance;Max assist;HOB elevated     General bed mobility comments: max directional cues; pt was able to slide RLE towards EOB but required max assist to elevate shoulders/trunk to sitting upright and use of draw pad to pivot hips around to EOB while supporting LE's  Transfers Overall transfer level: Needs assistance Equipment used: None Transfers: Sit to/from [employer] CorporationStand;Stand Pivot Transfers Sit to Stand: +2 physical assistance;Max assist Stand pivot transfers: +2 physical assistance;Max assist       General transfer comment: Max assist  to elevate hips from bed, maintain NWBing LLE, support trunk over LE's, and to rotate hips from bed<>chair.  Completed stand pivot x 3.  last 2 trials completed using face to face technique with pt wrapping arms around this clinicians neck.   Pt becomes frightened with standing and begins yelling & reaching for chair.  Pt required second person for provided assistance from behind to pivot hips safely to seated surface.     Balance Overall balance assessment: History of Falls                                         ADL either performed or assessed with clinical judgement   ADL Overall ADL's : Needs assistance/impaired Eating/Feeding: Set up;Sitting   Grooming: Set up;Sitting Grooming Details (indicate cue type and reason): Pt able to put in dentures while seated in recliner. Grandson assisting her with hearing aides Upper Body Bathing: Minimal assistance;Sitting   Lower Body Bathing: Maximal assistance;Sit to/from stand;+2 for physical assistance   Upper Body Dressing : Minimal assistance;Sitting   Lower Body Dressing: Maximal assistance;+2 for physical assistance;Sit to/from stand   Toilet Transfer: Maximal assistance;+2 for physical assistance;Stand-pivot;BSC Toilet Transfer Details (indicate cue type and reason): Pt performing standpivot to [facility] with Max A +2 for adherance to WB status and increase safety. Pt with high fear of falling and difficulty following commands.  Toileting- Clothing Manipulation and Hygiene: Maximal assistance;Sit to/from stand;+2 for physical assistance Toileting - Clothing Manipulation Details (indicate cue type and reason): Pt able to lean back and perform peri care while seated with assistance  to manage LLE. Required max A for toilet hygiene afetr BM with one perform cleaning and other maintaining standing.     Functional mobility during ADLs: Maximal assistance;+2 for physical assistance;Rolling walker;Cueing for sequencing;Cueing for  safety(SPT only) General ADL Comments: pt dmeonstrating decreased functional performance and is limited by pain in LLE. Pt with difficulty maintaining NWB status and requires assistance     Vision         Perception     Praxis      Pertinent Vitals/Pain Pain Assessment: Faces Faces Pain Scale: Hurts whole lot Pain Location: LLE Pain Descriptors / Indicators: Aching;Sharp;Moaning;Guarding;Grimacing Pain Intervention(s): Monitored during session;Limited activity within patient's tolerance;Repositioned     Hand Dominance Right   Extremity/Trunk Assessment Upper Extremity Assessment Upper Extremity Assessment: Generalized weakness   Lower Extremity Assessment Lower Extremity Assessment: Defer to PT evaluation   Cervical / Trunk Assessment Cervical / Trunk Assessment: Kyphotic   Communication Communication Communication: HOH   Cognition Arousal/Alertness: Awake/alert Behavior During Therapy: Anxious(with movement) Overall Cognitive Status: History of cognitive impairments - at baseline                                 General Comments: very HOH even with hearing aides   General Comments  Grandson present throughout session    Exercises    Shoulder Instructions      Home Living Family/patient expects to be discharged to:: Skilled nursing facility Living Arrangements: Alone;Other (Comment)(grandson comes to help and check on her multiple times during the day ) Available Help at Discharge: Family;Available 24 hours/day Type of Home: House Home Access: Ramped entrance     Home Layout: One level     Bathroom Shower/Tub: Chief Strategy Officer: Handicapped height Bathroom Accessibility: Yes   Home Equipment: Environmental consultant - 4 wheels;Bedside commode;Wheelchair - manual;Hospital bed;Shower seat   Additional Comments: family is directly next door to patient       Prior Functioning/Environment Level of Independence: Independent with assistive  device(s)        Comments: Pt performing basic ADLs. Grandson does IADLs        OT Problem List: Decreased strength;Decreased range of motion;Decreased activity tolerance;Impaired balance (sitting and/or standing);Decreased cognition;Decreased safety awareness;Decreased knowledge of use of DME or AE;Pain      OT Treatment/Interventions: Self-care/ADL training;Therapeutic exercise;Energy conservation;DME and/or AE instruction;Therapeutic activities;Patient/family education    OT Goals(Current goals can be found in the care plan section) Acute Rehab OT Goals Patient Stated Goal: for patient to ultimately be able to go home  OT Goal Formulation: With patient Time For Goal Achievement: 07/14/17 Potential to Achieve Goals: Good ADL Goals Pt Will Perform Lower Body Dressing: with adaptive equipment;sit to/from stand;with mod assist Pt Will Transfer to Toilet: with mod assist;bedside commode;stand pivot transfer Pt Will Perform Toileting - Clothing Manipulation and hygiene: with mod assist;sit to/from stand Additional ADL Goal #1: pt will verablize 3/3 posterior hip precautions with 1-2 VCs.  OT Frequency: Min 2X/week   Barriers to D/C:            Co-evaluation PT/OT/SLP Co-Evaluation/Treatment: Yes Reason for Co-Treatment: For patient/therapist safety PT goals addressed during session: Mobility/safety with mobility OT goals addressed during session: ADL's and self-care      AM-PAC PT "6 Clicks" Daily Activity     Outcome Measure Help from another person eating meals?: A Little Help from another person taking care of personal grooming?: A Little Help  from another person toileting, which includes using toliet, bedpan, or urinal?: A Lot Help from another person bathing (including washing, rinsing, drying)?: A Lot Help from another person to put on and taking off regular upper body clothing?: A Little Help from another person to put on and taking off regular lower body clothing?: A  Lot 6 Click Score: 15   End of Session Equipment Utilized During Treatment: Gait belt;Rolling walker;Oxygen Nurse Communication: Mobility status;Weight bearing status;Precautions  Activity Tolerance: Patient limited by pain Patient left: in chair;with call bell/phone within reach;with family/visitor present;with nursing/sitter in room  OT Visit Diagnosis: Unsteadiness on feet (R26.81);Other abnormalities of gait and mobility (R26.89);Muscle weakness (generalized) (M62.81);History of falling (Z91.81);Other symptoms and signs involving cognitive function;Pain Pain - Right/Left: Left Pain - part of body: Leg                Time: 1031-1110 OT Time Calculation (min): 39 min Charges:  OT General Charges $OT Visit: 1 Visit OT Evaluation $OT Eval Moderate Complexity: 1 Mod G-Codes:     Asahel Risden MSOT, OTR/L Acute Rehab Pager: [phone] Office: [phone]  Theodoro GristCharis M Horace Wishon 06/30/2017, 1:25 PM

## 2017-06-30 NOTE — Progress Notes (Signed)
Physical Therapy Treatment Patient Details Name: [name] MRN: [id number] DOB: Mar 29, 1917 Today's Date: 06/30/2017    History of Present Illness 83 yo female with known dementia who experienced a fall at home; xray showed displaced L hip fracture. Received L LE ORIF surgery 06/28/17, now NWB. PMH: hx pacemaker, hx DVT, gout, HTN, L THA     PT Comments    Pt supine in bed upon arrival.  Completed visit as Co-Tx with OT to ensure patient/clinician safety as pt unable to attempt OOB with PT eval.  Pt required max assist +2 assist to transition from supine to sitting EOB.  Once sitting EOB, able to maintain balance with hands in lap with min guard assist.  Completed stand pivot transfer from bed to recliner & recliner<>BSC requiring max assist +2 to achieve standing, maintain NWBing LLE, and to rotate hips.  Pt becomes frightened with transfer.  Instructed Nsing that pt is a +2 for transfer back to bed and discussed technique to ensure safety.       Follow Up Recommendations  SNF     Equipment Recommendations  None recommended by PT    Recommendations for Other Services OT consult;Speech consult     Precautions / Restrictions Precautions Precautions: Fall;ICD/Pacemaker Restrictions Weight Bearing Restrictions: No LLE Weight Bearing: Non weight bearing Other Position/Activity Restrictions: posterior hip precautions LLE    Mobility  Bed Mobility Overal bed mobility: Needs Assistance Bed Mobility: Supine to Sit     Supine to sit: +2 for physical assistance;Max assist;HOB elevated     General bed mobility comments: max directional cues; pt was able to slide RLE towards EOB but required max assist to elevate shoulders/trunk to sitting upright and use of draw pad to pivot hips around to EOB while supporting LE's  Transfers Overall transfer level: Needs assistance Equipment used: None Transfers: Sit to/from BJ'sStand;Stand Pivot Transfers Sit to Stand: +2 physical  assistance;Max assist Stand pivot transfers: +2 physical assistance;Max assist       General transfer comment: Max assist to elevate hips from bed, maintain NWBing LLE, support trunk over LE's, and to rotate hips from bed<>chair.  Completed stand pivot x 3.  last 2 trials completed using face to face technique with pt wrapping arms around this clinicians neck.   Pt becomes frightened with standing and begins yelling & reaching for chair.  OT provided assistance from behind to pivot hips safely to seated surface.   Ambulation/Gait                 Stairs            Wheelchair Mobility    Modified Rankin (Stroke Patients Only)       Balance                                            Cognition Arousal/Alertness: Awake/alert Behavior During Therapy: Anxious(with movement) Overall Cognitive Status: History of cognitive impairments - at baseline                                 General Comments: very HOH even with hearing aides      Exercises Total Joint Exercises Ankle Circles/Pumps: Both;10 reps Heel Slides: AAROM;Strengthening;Left;10 reps Hip ABduction/ADduction: AAROM;Strengthening;Left;10 reps    General Comments        Pertinent  Vitals/Pain Pain Assessment: Faces Faces Pain Scale: Hurts whole lot Pain Location: LLE Pain Descriptors / Indicators: Aching;Sharp;Moaning;Guarding;Grimacing Pain Intervention(s): Monitored during session;Repositioned;Limited activity within patient's tolerance    Home Living                      Prior Function            PT Goals (current goals can now be found in the care plan section) Acute Rehab PT Goals Patient Stated Goal: for patient to ultimately be able to go home  PT Goal Formulation: With family Time For Goal Achievement: 07/13/17 Potential to Achieve Goals: Fair Progress towards PT goals: Progressing toward goals    Frequency    Min 3X/week      PT Plan  Current plan remains appropriate    Co-evaluation PT/OT/SLP Co-Evaluation/Treatment: Yes Reason for Co-Treatment: For patient/therapist safety;To address functional/ADL transfers PT goals addressed during session: Mobility/safety with mobility;Strengthening/ROM        AM-PAC PT "6 Clicks" Daily Activity  Outcome Measure  Difficulty turning over in bed (including adjusting bedclothes, sheets and blankets)?: Unable Difficulty moving from lying on back to sitting on the side of the bed? : Unable Difficulty sitting down on and standing up from a chair with arms (e.g., wheelchair, bedside commode, etc,.)?: Unable Help needed moving to and from a bed to chair (including a wheelchair)?: Total Help needed walking in hospital room?: Total Help needed climbing 3-5 steps with a railing? : Total 6 Click Score: 6    End of Session Equipment Utilized During Treatment: Gait belt Activity Tolerance: Patient limited by pain Patient left: in chair;with call bell/phone within reach;with family/visitor present Nurse Communication: Mobility status PT Visit Diagnosis: Muscle weakness (generalized) (M62.81);Unsteadiness on feet (R26.81);History of falling (Z91.81);Difficulty in walking, not elsewhere classified (R26.2)     Time: [id number] PT Time Calculation (min) (ACUTE ONLY): 39 min  Charges:  $Therapeutic Activity: 23-37 mins                    G CodesVerdell Face:       Macai Sisneros, VirginiaPTA [phone] 06/30/2017

## 2017-06-30 NOTE — Progress Notes (Signed)
Orthopedic Trauma Service Progress Note   Patient ID: [name] MRN: [id number] DOB/AGE: February 17, 1917 83 y.o.  Subjective:  Pt looks much better today Doing well  Family wants [facility] in [place] as her SNF. She has been there in the past and her PCP has privileges there   ROS As above  Objective:   VITALS:   Vitals:   06/29/17 0703 06/29/17 2020 06/30/17 0348 06/30/17 0939  BP: (!) 113/47 140/60 (!) 130/52   Pulse: 69 80 95   Resp: 18 15 16    Temp: 97.7 F (36.5 C) 98 F (36.7 C) 97.9 F (36.6 C)   TempSrc: Oral Oral Oral   SpO2: 100% 99% 100%   Weight:    74.4 kg (164 lb)  Height:    5\' 4"  (1.626 m)    Estimated body mass index is 28.15 kg/m as calculated from the following:   Height as of this encounter: 5\' 4"  (1.626 m).   Weight as of this encounter: 74.4 kg (164 lb).   Intake/Output      12/07 0701 - 12/08 0700 12/08 0701 - 12/09 0700   P.O. 240 240   I.V. (mL/kg)  1928.8 (25.9)   IV Piggyback     Total Intake(mL/kg) 240 2168.8 (29.1)   Urine (mL/kg/hr) 1100    Drains     Stool  1   Blood     Total Output 1100 1   Net -860 +2167.8          LABS  Results for orders placed or performed during the hospital encounter of 06/26/17 (from the past 24 hour(s))  CBC     Status: Abnormal   Collection Time: 06/30/17  5:21 AM  Result Value Ref Range   WBC 10.9 (H) 4.0 - 10.5 K/uL   RBC 2.79 (L) 3.87 - 5.11 MIL/uL   Hemoglobin 8.2 (L) 12.0 - 15.0 g/dL   HCT 82.9 (L) 56.2 - 13.0 %   MCV 88.5 78.0 - 100.0 fL   MCH 29.4 26.0 - 34.0 pg   MCHC 33.2 30.0 - 36.0 g/dL   RDW 86.5 78.4 - 69.6 %   Platelets 174 150 - 400 K/uL  Basic metabolic panel     Status: Abnormal   Collection Time: 06/30/17  5:21 AM  Result Value Ref Range   Sodium 137 135 - 145 mmol/L   Potassium 3.9 3.5 - 5.1 mmol/L   Chloride 107 101 - 111 mmol/L   CO2 22 22 - 32 mmol/L   Glucose, Bld 144 (H) 65 - 99 mg/dL   BUN 10 6 - 20 mg/dL   Creatinine, Ser  2.95 0.44 - 1.00 mg/dL   Calcium 7.8 (L) 8.9 - 10.3 mg/dL   GFR calc non Af Amer >60 >60 mL/min   GFR calc Af Amer >60 >60 mL/min   Anion gap 8 5 - 15     PHYSICAL EXAM:   Gen: awake, more communicative today  Lungs: clear anterior fields Cardiac: s1 and s2 Abd: + BS, NTND Ext:       Left Lower Extremity              TED hose on              Prevena vac functioning, good seal             motor and sensory functions intact  Ext warm   + DP pulse    Assessment/Plan: 2 Days Post-Op   Principal Problem:  Displaced fracture of left femur (HCC) Active Problems:   Pacemaker   Complete heart block (HCC)   GOUT   Hypothyroidism   Hypokalemia   Fall at home, initial encounter   Hip fracture (HCC)   Anti-infectives (From admission, onward)   Start     Dose/Rate Route Frequency Ordered Stop   06/28/17 1800  clindamycin (CLEOCIN) IVPB 600 mg     600 mg 100 mL/hr over 30 Minutes Intravenous Every 6 hours 06/28/17 1642 06/29/17 0601   06/28/17 0740  ceFAZolin (ANCEF) IVPB 2g/100 mL premix     2 g 200 mL/hr over 30 Minutes [facility] 06/27/17 2003 06/28/17 1057    .  POD/HD#: 2  83 y/o female with numerous medical comorbidities s/p fall with L periprosthetic femur fracture    - fall    -L periprosthetic femur fracture s/p ORIF             NWB x 8 weeks             PT and OT evals               Unrestricted ROM L knee                         Pt does have L total hip replacement              Continue with Prevena until dc    - Pain management:             continue with scheduled tylenol  Continue to hold narcotics    - ABL anemia/Hemodynamics             check CBC tomorrow  May not need transfusion   Vitals look good               - Medical issues              Per medical service                         Chronic medical issues                         Hypokalemia                                     Replacement    - DVT/PE  prophylaxis:             Lovenox x 28 days post op   - ID:              Periop abx    - Metabolic Bone Disease:             Check vitamin d levels              - Activity:              NWB L leg              PT/OT evals   - FEN/GI prophylaxis/Foley/Lines:             on regular diet    - Impediments to fracture healing:             Dementia    - Dispo:  PT/OT  SW for SNF, [facility] [place]   Mearl LatinKeith W. Javarius Tsosie, PA-C [facility] [phone](P) [phone] Traci Sermon(O) [phone] (C) 06/30/2017, 11:48 AM

## 2017-07-01 LAB — BASIC METABOLIC PANEL
Anion gap: 9 (ref 5–15)
BUN: 14 mg/dL (ref 6–20)
CALCIUM: 8.1 mg/dL — AB (ref 8.9–10.3)
CO2: 22 mmol/L (ref 22–32)
CREATININE: 0.6 mg/dL (ref 0.44–1.00)
Chloride: 106 mmol/L (ref 101–111)
Glucose, Bld: 132 mg/dL — ABNORMAL HIGH (ref 65–99)
Potassium: 3.8 mmol/L (ref 3.5–5.1)
SODIUM: 137 mmol/L (ref 135–145)

## 2017-07-01 LAB — CBC
HEMATOCRIT: 26.3 % — AB (ref 36.0–46.0)
HEMOGLOBIN: 8.7 g/dL — AB (ref 12.0–15.0)
MCH: 29.5 pg (ref 26.0–34.0)
MCHC: 33.1 g/dL (ref 30.0–36.0)
MCV: 89.2 fL (ref 78.0–100.0)
Platelets: 230 10*3/uL (ref 150–400)
RBC: 2.95 MIL/uL — ABNORMAL LOW (ref 3.87–5.11)
RDW: 14.2 % (ref 11.5–15.5)
WBC: 10.9 10*3/uL — AB (ref 4.0–10.5)

## 2017-07-01 MED ORDER — POLYETHYLENE GLYCOL 3350 17 G PO PACK
17.0000 g | PACK | Freq: Every day | ORAL | Status: DC
  Administered 2017-07-01 – 2017-07-03 (×3): 17 g via ORAL
  Filled 2017-07-01 (×3): qty 1

## 2017-07-01 MED ORDER — DOCUSATE SODIUM 100 MG PO CAPS
100.0000 mg | ORAL_CAPSULE | Freq: Two times a day (BID) | ORAL | Status: DC
  Administered 2017-07-01 – 2017-07-03 (×4): 100 mg via ORAL
  Filled 2017-07-01 (×5): qty 1

## 2017-07-01 MED ORDER — BISACODYL 10 MG RE SUPP
10.0000 mg | Freq: Every day | RECTAL | Status: DC | PRN
  Filled 2017-07-01: qty 1

## 2017-07-01 NOTE — Progress Notes (Signed)
[facility]                                                                              Patient Demographics  [name], is a 83 y.o. female, DOB - October 19, 1916, ZOX:[id number]  Admit date - 06/26/2017   Admitting Physician Ankit Joline Maxcy, MD  Outpatient Primary MD for the patient is Barbie Banner, MD  Outpatient specialists:   LOS - 4  days   Medical records reviewed and are as summarized below:    Chief Complaint  Patient presents with  . Hip Injury       Brief summary   83 year old female with history of hypertension, hyperlipidemia, left hip arthritis status post replacement, complete heart block with pacemaker in place, hypothyroidism came to the hospital after a fall and found to have moderately displaced left femoral shaft fracture near her left hip processes.  Orthopedic consulted who plans are taken her to the OR tomorrow at [facility].  Cardiology consulted given her extensive disease and also upon family request prior to making surgical decision.   Assessment & Plan    Principal Problem:   Displaced fracture of left femur (HCC) after mechanical fall -Orthopedics consulted, underwent open reduction internal fixation on 12/6, postop day #1 -Alert and awake, close to her baseline, narcotics discontinued.  Continue Tylenol.  No UTI  -Start PT, per orthopedics, NWB for 8 weeks, continue prevena until discharge  -Lovenox 28 days postop  Active Problems: Acute blood loss anemia -H&H stable, improving, 8.7 today  Acute encephalopathy in the setting of possible dementia -Alert and awake, much more oriented now.  Continue Tylenol, no narcotics.   -No UTI  History of complete heart block status post pacemaker (placed in 2004, generator was changed in 2011) -Cardiology following, currently on Cardizem.   - Outpatient follows with Dr. Johney Frame.  Hypokalemia -Resolved, potassium 3.8  Hypothyroidism -Continue Synthroid  Essential  hypertension -BP stable, continue Cardizem, Lasix, metoprolol  History of gout -Currently stable, no flare, continue allopurinol  Constipation -Added Senokot-S, MiraLAX   Code Status: full  DVT Prophylaxis:  SCD Family Communication:   Disposition Plan: Skilled nursing facility in a.m. if bed available  Time Spent in minutes   15 minutes  Procedures:  ORIF left femur   Consultants:   Ortho   Antimicrobials:      Medications  Scheduled Meds: . acetaminophen  1,000 mg Oral Q6H  . allopurinol  100 mg Oral Daily  . darifenacin  7.5 mg Oral Daily  . diltiazem  180 mg Oral Daily  . docusate sodium  100 mg Oral BID  . enoxaparin (LOVENOX) injection  40 mg Subcutaneous Q24H  . folic acid  1 mg Oral Daily  . furosemide  20 mg Oral Daily  . levothyroxine  112 mcg Oral QAC breakfast  . metoprolol tartrate  12.5 mg Oral BID  . polyethylene glycol  17 g Oral Daily  . senna-docusate  1 tablet Oral BID   Continuous Infusions: . dextrose 5 % and 0.9% NaCl 75 mL/hr at 06/28/17 0625   PRN Meds:.ALPRAZolam, bisacodyl, metoCLOPramide **OR** metoCLOPramide (REGLAN) injection, morphine injection, ondansetron **  OR** ondansetron (ZOFRAN) IV   Antibiotics   Anti-infectives (From admission, onward)   Start     Dose/Rate Route Frequency Ordered Stop   06/28/17 1800  clindamycin (CLEOCIN) IVPB 600 mg     600 mg 100 mL/hr over 30 Minutes Intravenous Every 6 hours 06/28/17 1642 06/29/17 0601   06/28/17 0740  ceFAZolin (ANCEF) IVPB 2g/100 mL premix     2 g 200 mL/hr over 30 Minutes [facility] 06/27/17 2003 06/28/17 1057        Subjective:   [name] was seen and examined.  Alert and awake, denies any specific complaints, eating with assistance.  Pain controlled. Denies any chest pain, nausea, vomiting, abdominal pain.  No fever  Objective:   Vitals:   06/30/17 0939 06/30/17 1500 06/30/17 2150 07/01/17 0510  BP:  (!) 110/55 (!) 116/53 129/78    Pulse:  68 76 87  Resp:  16 16 18   Temp:  (!) 97.5 F (36.4 C) 97.8 F (36.6 C) 98.1 F (36.7 C)  TempSrc:  Oral Oral Oral  SpO2:  100% 99% 100%  Weight: 74.4 kg (164 lb)     Height: 5\' 4"  (1.626 m)       Intake/Output Summary (Last 24 hours) at 07/01/2017 1125 Last data filed at 07/01/2017 0900 Gross per 24 hour  Intake 840 ml  Output 1200 ml  Net -360 ml     Wt Readings from Last 3 Encounters:  06/30/17 74.4 kg (164 lb)  05/03/16 74.8 kg (164 lb 12.8 oz)  05/03/15 75.2 kg (165 lb 12.8 oz)     Exam   General: Alert and oriented x self, pleasant, NAD  Eyes:   HEENT:    Cardiovascular: S1 S2 clear,Regular rate and rhythm. No pedal edema b/l  Respiratory: Clear to auscultation bilaterally, no wheezing, rales or rhonchi  Gastrointestinal: Soft, nontender, nondistended, + bowel sounds  Ext: no pedal edema bilaterally  Neuro: no new deficits  Musculoskeletal: No digital cyanosis, clubbing  Skin: No rashes  Psych: much more alert and awake now   Data Reviewed:  I have personally reviewed following labs and imaging studies  Micro Results Recent Results (from the past 240 hour(s))  Surgical pcr screen     Status: None   Collection Time: 06/27/17  8:19 PM  Result Value Ref Range Status   MRSA, PCR NEGATIVE NEGATIVE Final   Staphylococcus aureus NEGATIVE NEGATIVE Final    Comment: (NOTE) The Xpert SA Assay (FDA approved for NASAL specimens in patients 81 years of age and older), is one component of a comprehensive surveillance program. It is not intended to diagnose infection nor to guide or monitor treatment.     Radiology Reports Dg Chest 1 View  Result Date: 06/26/2017 CLINICAL DATA:  83 year old female presents after fall. Left leg pain and swelling. EXAM: CHEST 1 VIEW COMPARISON:  10/15/2013 CXR FINDINGS: The heart size and mediastinal contours are within normal limits. Aortic atherosclerosis at the arch without aneurysm. Left-sided pacemaker  apparatus with right atrial and right ventricular leads are again noted without change. Both lungs are clear. The visualized skeletal structures are unremarkable. IMPRESSION: No active disease.  Aortic atherosclerosis. Electronically Signed   By: Tollie Ethavid  Kwon M.D.   On: 06/26/2017 19:41   Dg C-arm Gt 120 Min  Result Date: 06/28/2017 CLINICAL DATA:  ORIF left femur. EXAM: DG C-ARM GT 120 MIN; LEFT FEMUR 2 VIEWS CONTRAST:  None. FLUOROSCOPY TIME:  Fluoroscopy Time:  0 minutes 35 seconds  Number of Acquired Spot Images: 9 COMPARISON:  None. FINDINGS: Postsurgical changes left femur. Hardware intact. Anatomic alignment. IMPRESSION: Postsurgical changes left femur.  Anatomic alignment. Electronically Signed   By: Maisie Fus  Register   On: 06/28/2017 13:50   Dg Femur Min 2 Views Left  Result Date: 06/28/2017 CLINICAL DATA:  ORIF left femur. EXAM: DG C-ARM GT 120 MIN; LEFT FEMUR 2 VIEWS CONTRAST:  None. FLUOROSCOPY TIME:  Fluoroscopy Time:  0 minutes 35 seconds Number of Acquired Spot Images: 9 COMPARISON:  None. FINDINGS: Postsurgical changes left femur. Hardware intact. Anatomic alignment. IMPRESSION: Postsurgical changes left femur.  Anatomic alignment. Electronically Signed   By: Maisie Fus  Register   On: 06/28/2017 13:50   Dg Femur Min 2 Views Left  Result Date: 06/26/2017 CLINICAL DATA:  Left leg pain after fall today. EXAM: LEFT FEMUR 2 VIEWS COMPARISON:  Radiograph of July 02, 2011. FINDINGS: Status post left hemiarthroplasty. Moderately displaced fracture of left femur is noted with slightly overriding fracture fragments. Fracture begins at distal tip of prosthesis. IMPRESSION: Moderately displaced left femoral shaft fracture is noted with slightly overriding fracture fragments. Proximal portion of fracture begins at distal tip of left hip prosthesis. Electronically Signed   By: Lupita Raider, M.D.   On: 06/26/2017 19:40   Dg Femur Port Min 2 Views Left  Result Date: 06/28/2017 CLINICAL DATA:  Initial  evaluation status post ORIF of left femur. EXAM: LEFT FEMUR PORTABLE 2 VIEWS COMPARISON:  Prior radiograph from 06/26/2017. FINDINGS: Postoperative changes from recent ORIF of left femoral fracture seen. Malleable plate screw fixation with associated cerclage wires. Fracture in normal anatomic alignment. Hardware appears well positioned without complication. Postoperative swelling with emphysema present within the left thigh. Left hip prosthesis again noted. IMPRESSION: Postoperative changes from recent ORIF for left femoral fracture. Electronically Signed   By: Rise Mu M.D.   On: 06/28/2017 15:15    Lab Data:  CBC: Recent Labs  Lab 06/26/17 1925  06/28/17 0609 06/29/17 0512 06/29/17 1117 06/30/17 0521 07/01/17 0434  WBC 17.8*   < > 11.5* 10.2 14.6* 10.9* 10.9*  NEUTROABS 15.6*  --   --   --   --   --   --   HGB 13.0   < > 11.2* 8.1* 8.5* 8.2* 8.7*  HCT 38.7   < > 34.1* 24.6* 26.0* 24.7* 26.3*  MCV 87.2   < > 87.7 88.5 88.7 88.5 89.2  PLT 245   < > 216 167 178 174 230   < > = values in this interval not displayed.   Basic Metabolic Panel: Recent Labs  Lab 06/27/17 0655 06/28/17 0609 06/29/17 0512 06/30/17 0521 07/01/17 0434  NA 139 137 137 137 137  K 3.6 3.2* 3.3* 3.9 3.8  CL 109 104 107 107 106  CO2 24 23 26 22 22   GLUCOSE 120* 146* 163* 144* 132*  BUN 13 10 13 10 14   CREATININE 0.76 0.67 0.99 0.63 0.60  CALCIUM 8.1* 8.0* 7.7* 7.8* 8.1*  MG 2.2  --   --   --   --    GFR: Estimated Creatinine Clearance: 38.8 mL/min (by C-G formula based on SCr of 0.6 mg/dL). Liver Function Tests: Recent Labs  Lab 06/28/17 0609 06/29/17 0512  AST 35 44*  ALT 12* 13*  ALKPHOS 63 47  BILITOT 0.8 0.2*  PROT 6.2* 4.7*  ALBUMIN 3.1* 2.4*   No results for input(s): LIPASE, AMYLASE in the last 168 hours. No results for input(s): AMMONIA in  the last 168 hours. Coagulation Profile: Recent Labs  Lab 06/26/17 2119 06/28/17 0609  INR 1.14 1.22   Cardiac Enzymes: No  results for input(s): CKTOTAL, CKMB, CKMBINDEX, TROPONINI in the last 168 hours. BNP (last 3 results) No results for input(s): PROBNP in the last 8760 hours. HbA1C: No results for input(s): HGBA1C in the last 72 hours. CBG: Recent Labs  Lab 06/26/17 1842 06/28/17 1753  GLUCAP 112* 134*   Lipid Profile: No results for input(s): CHOL, HDL, LDLCALC, TRIG, CHOLHDL, LDLDIRECT in the last 72 hours. Thyroid Function Tests: No results for input(s): TSH, T4TOTAL, FREET4, T3FREE, THYROIDAB in the last 72 hours. Anemia Panel: No results for input(s): VITAMINB12, FOLATE, FERRITIN, TIBC, IRON, RETICCTPCT in the last 72 hours. Urine analysis:    Component Value Date/Time   COLORURINE YELLOW 06/27/2017 0611   APPEARANCEUR HAZY (A) 06/27/2017 0611   LABSPEC 1.009 06/27/2017 0611   PHURINE 9.0 (H) 06/27/2017 0611   GLUCOSEU NEGATIVE 06/27/2017 0611   HGBUR NEGATIVE 06/27/2017 0611   BILIRUBINUR NEGATIVE 06/27/2017 0611   KETONESUR 5 (A) 06/27/2017 0611   PROTEINUR NEGATIVE 06/27/2017 0611   UROBILINOGEN 1.0 01/05/2015 1427   NITRITE NEGATIVE 06/27/2017 0611   LEUKOCYTESUR NEGATIVE 06/27/2017 [id number]     Shenica Holzheimer M.D. [facility] 07/01/2017, 11:25 AM  Pager: [phone] Between 7am to 7pm - call Pager - [phone]  After 7pm go to [email or web address] - password [email or web address]  Call night coverage person covering after 7pm

## 2017-07-01 NOTE — Progress Notes (Signed)
Orthopedic Trauma Service Progress Note   Patient ID: [name] MRN: [id number] DOB/AGE: 81-Feb-1918 81 y.o.  Subjective:  Ortho issues stable Pt doesn't have hearing aids in this am so communication is somewhat difficult  Pt using urine wick  States she feels constipated    ROS As above  Objective:   VITALS:   Vitals:   06/30/17 0939 06/30/17 1500 06/30/17 2150 07/01/17 0510  BP:  (!) 110/55 (!) 116/53 129/78  Pulse:  68 76 87  Resp:  16 16 18   Temp:  (!) 97.5 F (36.4 C) 97.8 F (36.6 C) 98.1 F (36.7 C)  TempSrc:  Oral Oral Oral  SpO2:  100% 99% 100%  Weight: 74.4 kg (164 lb)     Height: 5\' 4"  (1.626 m)       Estimated body mass index is 28.15 kg/m as calculated from the following:   Height as of this encounter: 5\' 4"  (1.626 m).   Weight as of this encounter: 74.4 kg (164 lb).   Intake/Output      12/08 0701 - 12/09 0700 12/09 0701 - 12/10 0700   P.O. 720 360   I.V. (mL/kg) 1928.8 (25.9)    Total Intake(mL/kg) 2648.8 (35.6) 360 (4.8)   Urine (mL/kg/hr) 1201 (0.7)    Stool 1    Total Output 1202    Net +1446.8 +360          LABS  Results for orders placed or performed during the hospital encounter of 06/26/17 (from the past 24 hour(s))  CBC     Status: Abnormal   Collection Time: 07/01/17  4:34 AM  Result Value Ref Range   WBC 10.9 (H) 4.0 - 10.5 K/uL   RBC 2.95 (L) 3.87 - 5.11 MIL/uL   Hemoglobin 8.7 (L) 12.0 - 15.0 g/dL   HCT 56.226.3 (L) 13.036.0 - 86.546.0 %   MCV 89.2 78.0 - 100.0 fL   MCH 29.5 26.0 - 34.0 pg   MCHC 33.1 30.0 - 36.0 g/dL   RDW 78.414.2 69.611.5 - 29.515.5 %   Platelets 230 150 - 400 K/uL  Basic metabolic panel     Status: Abnormal   Collection Time: 07/01/17  4:34 AM  Result Value Ref Range   Sodium 137 135 - 145 mmol/L   Potassium 3.8 3.5 - 5.1 mmol/L   Chloride 106 101 - 111 mmol/L   CO2 22 22 - 32 mmol/L   Glucose, Bld 132 (H) 65 - 99 mg/dL   BUN 14 6 - 20 mg/dL   Creatinine, Ser 2.840.60 0.44 - 1.00 mg/dL   Calcium 8.1 (L) 8.9 - 10.3 mg/dL   GFR calc non Af Amer >60 >60 mL/min   GFR calc Af Amer >60 >60 mL/min   Anion gap 9 5 - 15     PHYSICAL EXAM:   Gen: awake, appears comfortable  Lungs: clear anterior fields Cardiac: s1 and s2 Abd: + BS, lower abd firm but NT  Ext:       Left Lower Extremity              TED hose on              Prevena vac functioning, good seal             motor and sensory functions intact             Ext warm              + DP pulse  Assessment/Plan: 3 Days Post-Op   Principal Problem:   Displaced fracture of left femur (HCC) Active Problems:   Pacemaker   Complete heart block (HCC)   GOUT   Hypothyroidism   Hypokalemia   Fall at home, initial encounter   Hip fracture (HCC)   Anti-infectives (From admission, onward)   Start     Dose/Rate Route Frequency Ordered Stop   06/28/17 1800  clindamycin (CLEOCIN) IVPB 600 mg     600 mg 100 mL/hr over 30 Minutes Intravenous Every 6 hours 06/28/17 1642 06/29/17 0601   06/28/17 0740  ceFAZolin (ANCEF) IVPB 2g/100 mL premix     2 g 200 mL/hr over 30 Minutes Intravenous To [facility] 06/27/17 2003 06/28/17 1057    .  POD/HD#: 41  83 y/o female with numerous medical comorbidities s/p fall with L periprosthetic femur fracture    - fall    -L periprosthetic femur fracture s/p ORIF             NWB x 8 weeks             PT and OT evals               Unrestricted ROM L knee                         Pt does have L total hip replacement              Continue with Prevena until dc    - Pain management:             continue with scheduled tylenol             Continue to hold narcotics    - ABL anemia/Hemodynamics            cbc looks good today  No transfusion at this time    - Medical issues              Per medical service                         Chronic medical issues                         Hypokalemia                                   corrected    - DVT/PE prophylaxis:              Lovenox x 28 days post op   - ID:              Periop abx    - Metabolic Bone Disease:             Check vitamin d levels- pending    - Activity:              NWB L leg  Unrestricted ROM L knee               PT/OT    - FEN/GI prophylaxis/Foley/Lines:             on regular diet   Advance bowel regimen    - Impediments to fracture healing:             Dementia    - Dispo:  PT/OT             SW for SNF, [facility] [place]      Mearl LatinKeith W. Durene Dodge, PA-C [facility] [phone](P) [phone] Traci Sermon(O) [phone] (C) 07/01/2017, 10:26 AM

## 2017-07-02 ENCOUNTER — Encounter (HOSPITAL_COMMUNITY): Payer: Self-pay | Admitting: Orthopedic Surgery

## 2017-07-02 LAB — VITAMIN D 25 HYDROXY (VIT D DEFICIENCY, FRACTURES): Vit D, 25-Hydroxy: 26 ng/mL — ABNORMAL LOW (ref 30.0–100.0)

## 2017-07-02 LAB — BASIC METABOLIC PANEL
Anion gap: 10 (ref 5–15)
BUN: 13 mg/dL (ref 6–20)
CHLORIDE: 105 mmol/L (ref 101–111)
CO2: 24 mmol/L (ref 22–32)
Calcium: 7.9 mg/dL — ABNORMAL LOW (ref 8.9–10.3)
Creatinine, Ser: 0.61 mg/dL (ref 0.44–1.00)
GFR calc Af Amer: >60 mL/min (ref >60)
GFR calc non Af Amer: >60 mL/min (ref >60)
Glucose, Bld: 112 mg/dL — ABNORMAL HIGH (ref 65–99)
POTASSIUM: 3.1 mmol/L — AB (ref 3.5–5.1)
SODIUM: 139 mmol/L (ref 135–145)

## 2017-07-02 LAB — CBC
HEMATOCRIT: 24.6 % — AB (ref 36.0–46.0)
HEMOGLOBIN: 8.1 g/dL — AB (ref 12.0–15.0)
MCH: 28.8 pg (ref 26.0–34.0)
MCHC: 32.9 g/dL (ref 30.0–36.0)
MCV: 87.5 fL (ref 78.0–100.0)
Platelets: 253 10*3/uL (ref 150–400)
RBC: 2.81 MIL/uL — AB (ref 3.87–5.11)
RDW: 14.1 % (ref 11.5–15.5)
WBC: 8.5 10*3/uL (ref 4.0–10.5)

## 2017-07-02 LAB — GLUCOSE, CAPILLARY: GLUCOSE-CAPILLARY: 148 mg/dL — AB (ref 65–99)

## 2017-07-02 MED ORDER — POTASSIUM CHLORIDE CRYS ER 10 MEQ PO TBCR
40.0000 meq | EXTENDED_RELEASE_TABLET | Freq: Once | ORAL | Status: AC
  Administered 2017-07-02: 40 meq via ORAL
  Filled 2017-07-02: qty 4

## 2017-07-02 NOTE — Progress Notes (Signed)
Physical Therapy Treatment Patient Details Name: [name] MRN: [id number] DOB: [birth year] Today's Date: 07/02/2017    History of Present Illness 83 yo female with known dementia who experienced a fall at home; xray showed displaced L hip fracture. Received L LE ORIF surgery 06/28/17, now NWB. PMH: hx pacemaker, hx DVT, gout, HTN, L THA     PT Comments    Patient supine in bed. Multimodal cues to participate with PT. Patient requiring Max A +2 for supine to EOB with patient maintaining EOB for ~5 minutes with Min-Mod A to maintain upright posture, as patient tendency to retropulse. Patient concerned throughout session with lines, tubes, and gown even with heavy directional cues towards PT participation. Patient seemingly agitated throughout session with requests to refrain from transfers. Sit to supine transfer completed with patient safely in bed at conclusion of session.    Follow Up Recommendations  SNF     Equipment Recommendations  None recommended by PT    Recommendations for Other Services       Precautions / Restrictions Precautions Precautions: Fall;ICD/Pacemaker;Posterior Hip Restrictions Weight Bearing Restrictions: Yes LLE Weight Bearing: Non weight bearing    Mobility  Bed Mobility Overal bed mobility: Needs Assistance Bed Mobility: Supine to Sit Rolling: Max assist   Supine to sit: Max assist;+2 for physical assistance     General bed mobility comments: heavy VC and TC for supine to EOB transfer; Mod A to maintain upright posture as pateint tends to retropulse. Patient concerned with lines and tubes even with heavy direction towards transfers and participation with PT  Transfers                 General transfer comment: [facility] - wishing to stay in bed even with max VC for transfer training  Ambulation/Gait                 Stairs            Wheelchair Mobility     Modified Rankin (Stroke Patients Only)       Balance                                            Cognition Arousal/Alertness: Awake/alert Behavior During Therapy: Agitated Overall Cognitive Status: History of cognitive impairments - at baseline                                 General Comments: very HOH even with hearing aides      Exercises      General Comments        Pertinent Vitals/Pain Pain Assessment: Faces Faces Pain Scale: Hurts even more Pain Location: LLE Pain Descriptors / Indicators: Grimacing;Guarding;Moaning Pain Intervention(s): Limited activity within patient's tolerance;Monitored during session    Home Living                      Prior Function            PT Goals (current goals can now be found in the care plan section) Acute Rehab PT Goals Patient Stated Goal: for patient to ultimately be able to go home  PT Goal Formulation: With family Time For Goal Achievement: 07/13/17 Potential to Achieve Goals: Fair Progress towards PT goals: Progressing  toward goals    Frequency    Min 3X/week      PT Plan Current plan remains appropriate    Co-evaluation              AM-PAC PT "6 Clicks" Daily Activity  Outcome Measure  Difficulty turning over in bed (including adjusting bedclothes, sheets and blankets)?: Unable Difficulty moving from lying on back to sitting on the side of the bed? : Unable Difficulty sitting down on and standing up from a chair with arms (e.g., wheelchair, bedside commode, etc,.)?: Unable Help needed moving to and from a bed to chair (including a wheelchair)?: Total Help needed walking in hospital room?: Total Help needed climbing 3-5 steps with a railing? : Total 6 Click Score: 6    End of Session   Activity Tolerance: Patient limited by pain;Treatment limited secondary to agitation Patient left: in bed;with call bell/phone within reach;with bed alarm set Nurse  Communication: Mobility status PT Visit Diagnosis: Muscle weakness (generalized) (M62.81);Unsteadiness on feet (R26.81);History of falling (Z91.81);Difficulty in walking, not elsewhere classified (R26.2)     Time: [id number] PT Time Calculation (min) (ACUTE ONLY): 16 min  Charges:  $Therapeutic Activity: 8-22 mins                    Kipp LaurenceStephanie R Suhailah Kwan, PT, DPT 07/02/17 9:31 AM

## 2017-07-02 NOTE — Progress Notes (Signed)
Orthopaedic Trauma Service (OTS)  4 Days Post-Op Procedure(s) (LRB): OPEN REDUCTION INTERNAL FIXATION (ORIF) DISTAL FEMUR FRACTURE (Left)  Subjective: Patient reports pain as mild.  [facility]  Objective: Current Vitals Blood pressure (!) 138/57, pulse 73, temperature 98.1 F (36.7 C), temperature source Oral, resp. rate 16, height 5\' 4"  (1.626 m), weight 74.4 kg (164 lb), SpO2 96 %. Vital signs in last 24 hours: Temp:  [98 F (36.7 C)-98.1 F (36.7 C)] 98.1 F (36.7 C) (12/10 0600) Pulse Rate:  [73-79] 73 (12/10 0600) Resp:  [16-18] 16 (12/10 0600) BP: (115-152)/(55-63) 138/57 (12/10 0600) SpO2:  [95 %-96 %] 96 % (12/10 0600)  Intake/Output from previous day: 12/09 0701 - 12/10 0700 In: 596 [P.O.:596] Out: 700 [Urine:700]  LABS Recent Labs    06/30/17 0521 07/01/17 0434 07/02/17 0326  HGB 8.2* 8.7* 8.1*   Recent Labs    07/01/17 0434 07/02/17 0326  WBC 10.9* 8.5  RBC 2.95* 2.81*  HCT 26.3* 24.6*  PLT 230 253   Recent Labs    07/01/17 0434 07/02/17 0326  NA 137 139  K 3.8 3.1*  CL 106 105  CO2 22 24  BUN 14 13  CREATININE 0.60 0.61  GLUCOSE 132* 112*  CALCIUM 8.1* 7.9*   No results for input(s): LABPT, INR in the last 72 hours.   Physical Exam LLE Wound intact, clean, dry--Dressing changed  Edema/ swelling controlled  Sens: DPN, SPN, TN intact  Motor: EHL, FHL, and lessor toe ext and flex all intact grossly  Brisk cap refill, warm to touch  Assessment/Plan: 4 Days Post-Op Procedure(s) (LRB): OPEN REDUCTION INTERNAL FIXATION (ORIF) DISTAL FEMUR FRACTURE (Left) 1. PT/OT NWB with unrestricted knee motion 2. DVT proph Lovenox 3. F/u 8-14 days  Myrene GalasMichael Kaetlyn Noa, MD [facility] [phone] (p)

## 2017-07-02 NOTE — Progress Notes (Signed)
[facility]                                                                              Patient Demographics  [name], is a 83 y.o. female, DOB - [birth year], ZOX:[id number]  Admit date - 06/26/2017   Admitting Physician Ankit Joline Maxcy, MD  Outpatient Primary MD for the patient is Barbie Banner, MD  Outpatient specialists:   LOS - 5  days   Medical records reviewed and are as summarized below:    Chief Complaint  Patient presents with  . Hip Injury       Brief summary   83 year old female with history of hypertension, hyperlipidemia, left hip arthritis status post replacement, complete heart block with pacemaker in place, hypothyroidism came to the hospital after a fall and found to have moderately displaced left femoral shaft fracture near her left hip processes.  Orthopedic consulted who plans are taken her to the OR tomorrow at [facility].  Cardiology consulted given her extensive disease and also upon family request prior to making surgical decision.   Assessment & Plan    Principal Problem:   Displaced fracture of left femur (HCC) after mechanical fall -Orthopedics consulted, underwent open reduction internal fixation on 12/6, postop day #4 -Alert and awake, close to her baseline, narcotics discontinued.  Continue Tylenol.  No UTI  -Start PT, per orthopedics, NWB for 8 weeks, continue prevena until discharge  -Lovenox 28 days postop  Active Problems: Acute blood loss anemia -H&H stable, 8.1 today, follow closely   Acute encephalopathy in the setting of possible dementia -Alert and awake, avoid narcotics.  Continue Tylenol -No UTI  History of complete heart block status post pacemaker (placed in 2004, generator was changed in 2011) -Cardiology following, currently on Cardizem.   - Outpatient follows with Dr. Johney Frame.  Hypokalemia -replaced   Hypothyroidism -Continue Synthroid  Essential hypertension -BP stable, continue  Cardizem, Lasix, metoprolol  History of gout -Currently stable, no flare, continue allopurinol  Constipation -Added Senokot-S, MiraLAX   Code Status: full  DVT Prophylaxis:  SCD Family Communication:   Disposition Plan:  SNF, awaiting bed   Time Spent in minutes   15 minutes  Procedures:  ORIF left femur 12/6   Consultants:   Ortho   Antimicrobials:      Medications  Scheduled Meds: . acetaminophen  1,000 mg Oral Q6H  . allopurinol  100 mg Oral Daily  . darifenacin  7.5 mg Oral Daily  . diltiazem  180 mg Oral Daily  . docusate sodium  100 mg Oral BID  . enoxaparin (LOVENOX) injection  40 mg Subcutaneous Q24H  . folic acid  1 mg Oral Daily  . furosemide  20 mg Oral Daily  . levothyroxine  112 mcg Oral QAC breakfast  . metoprolol tartrate  12.5 mg Oral BID  . polyethylene glycol  17 g Oral Daily  . senna-docusate  1 tablet Oral BID   Continuous Infusions: . dextrose 5 % and 0.9% NaCl 75 mL/hr at 06/28/17 0625   PRN Meds:.ALPRAZolam, bisacodyl, metoCLOPramide **OR** metoCLOPramide (REGLAN) injection, morphine injection, ondansetron **OR** ondansetron (ZOFRAN) IV   Antibiotics  Anti-infectives (From admission, onward)   Start     Dose/Rate Route Frequency Ordered Stop   06/28/17 1800  clindamycin (CLEOCIN) IVPB 600 mg     600 mg 100 mL/hr over 30 Minutes Intravenous Every 6 hours 06/28/17 1642 06/29/17 0601   06/28/17 0740  ceFAZolin (ANCEF) IVPB 2g/100 mL premix     2 g 200 mL/hr over 30 Minutes Intravenous To [facility] 06/27/17 2003 06/28/17 1057        Subjective:   [name] was seen and examined.  Alert, somewhat confused, has dementia.  Denies any chest pain, nausea, vomiting, abdominal pain.  No fever  Objective:   Vitals:   07/01/17 0510 07/01/17 1431 07/01/17 2200 07/02/17 0600  BP: 129/78 115/63 (!) 152/55 (!) 138/57  Pulse: 87 73 79 73  Resp: 18 16 18 16   Temp: 98.1 F (36.7 C) 98 F (36.7 C)  98.1 F (36.7 C)    TempSrc: Oral Oral  Oral  SpO2: 100% 95% 96% 96%  Weight:      Height:        Intake/Output Summary (Last 24 hours) at 07/02/2017 1555 Last data filed at 07/02/2017 0600 Gross per 24 hour  Intake -  Output 700 ml  Net -700 ml     Wt Readings from Last 3 Encounters:  06/30/17 74.4 kg (164 lb)  05/03/16 74.8 kg (164 lb 12.8 oz)  05/03/15 75.2 kg (165 lb 12.8 oz)     Exam  Physical Exam General: Alert and oriented x self, NAD Eyes:  HEENT:   Cardiovascular: S1 S2 auscultated, no rubs, murmurs or gallops. Regular rate and rhythm. No pedal edema b/l Respiratory: CTAB  Gastrointestinal: Soft, nontender, nondistended, + bowel sounds Ext: no pedal edema bilaterally Neuro: dementia, unable to assess Musculoskeletal: No digital cyanosis, clubbing Skin: No rashes Psych: confused, but has dementia       Data Reviewed:  I have personally reviewed following labs and imaging studies  Micro Results Recent Results (from the past 240 hour(s))  Surgical pcr screen     Status: None   Collection Time: 06/27/17  8:19 PM  Result Value Ref Range Status   MRSA, PCR NEGATIVE NEGATIVE Final   Staphylococcus aureus NEGATIVE NEGATIVE Final    Comment: (NOTE) The Xpert SA Assay (FDA approved for NASAL specimens in patients 61 years of age and older), is one component of a comprehensive surveillance program. It is not intended to diagnose infection nor to guide or monitor treatment.     Radiology Reports Dg Chest 1 View  Result Date: 06/26/2017 CLINICAL DATA:  83 year old female presents after fall. Left leg pain and swelling. EXAM: CHEST 1 VIEW COMPARISON:  10/15/2013 CXR FINDINGS: The heart size and mediastinal contours are within normal limits. Aortic atherosclerosis at the arch without aneurysm. Left-sided pacemaker apparatus with right atrial and right ventricular leads are again noted without change. Both lungs are clear. The visualized skeletal structures are unremarkable.  IMPRESSION: No active disease.  Aortic atherosclerosis. Electronically Signed   By: Tollie Eth M.D.   On: 06/26/2017 19:41   Dg C-arm Gt 120 Min  Result Date: 06/28/2017 CLINICAL DATA:  ORIF left femur. EXAM: DG C-ARM GT 120 MIN; LEFT FEMUR 2 VIEWS CONTRAST:  None. FLUOROSCOPY TIME:  Fluoroscopy Time:  0 minutes 35 seconds Number of Acquired Spot Images: 9 COMPARISON:  None. FINDINGS: Postsurgical changes left femur. Hardware intact. Anatomic alignment. IMPRESSION: Postsurgical changes left femur.  Anatomic alignment. Electronically Signed   By: Maisie Fus  Register   On: 06/28/2017 13:50   Dg Femur Min 2 Views Left  Result Date: 06/28/2017 CLINICAL DATA:  ORIF left femur. EXAM: DG C-ARM GT 120 MIN; LEFT FEMUR 2 VIEWS CONTRAST:  None. FLUOROSCOPY TIME:  Fluoroscopy Time:  0 minutes 35 seconds Number of Acquired Spot Images: 9 COMPARISON:  None. FINDINGS: Postsurgical changes left femur. Hardware intact. Anatomic alignment. IMPRESSION: Postsurgical changes left femur.  Anatomic alignment. Electronically Signed   By: Maisie Fushomas  Register   On: 06/28/2017 13:50   Dg Femur Min 2 Views Left  Result Date: 06/26/2017 CLINICAL DATA:  Left leg pain after fall today. EXAM: LEFT FEMUR 2 VIEWS COMPARISON:  Radiograph of July 02, 2011. FINDINGS: Status post left hemiarthroplasty. Moderately displaced fracture of left femur is noted with slightly overriding fracture fragments. Fracture begins at distal tip of prosthesis. IMPRESSION: Moderately displaced left femoral shaft fracture is noted with slightly overriding fracture fragments. Proximal portion of fracture begins at distal tip of left hip prosthesis. Electronically Signed   By: Lupita RaiderJames  Green Jr, M.D.   On: 06/26/2017 19:40   Dg Femur Port Min 2 Views Left  Result Date: 06/28/2017 CLINICAL DATA:  Initial evaluation status post ORIF of left femur. EXAM: LEFT FEMUR PORTABLE 2 VIEWS COMPARISON:  Prior radiograph from 06/26/2017. FINDINGS: Postoperative changes from  recent ORIF of left femoral fracture seen. Malleable plate screw fixation with associated cerclage wires. Fracture in normal anatomic alignment. Hardware appears well positioned without complication. Postoperative swelling with emphysema present within the left thigh. Left hip prosthesis again noted. IMPRESSION: Postoperative changes from recent ORIF for left femoral fracture. Electronically Signed   By: Rise MuBenjamin  McClintock M.D.   On: 06/28/2017 15:15    Lab Data:  CBC: Recent Labs  Lab 06/26/17 1925  06/29/17 0512 06/29/17 1117 06/30/17 0521 07/01/17 0434 07/02/17 0326  WBC 17.8*   < > 10.2 14.6* 10.9* 10.9* 8.5  NEUTROABS 15.6*  --   --   --   --   --   --   HGB 13.0   < > 8.1* 8.5* 8.2* 8.7* 8.1*  HCT 38.7   < > 24.6* 26.0* 24.7* 26.3* 24.6*  MCV 87.2   < > 88.5 88.7 88.5 89.2 87.5  PLT 245   < > 167 178 174 230 253   < > = values in this interval not displayed.   Basic Metabolic Panel: Recent Labs  Lab 06/27/17 0655 06/28/17 0609 06/29/17 0512 06/30/17 0521 07/01/17 0434 07/02/17 0326  NA 139 137 137 137 137 139  K 3.6 3.2* 3.3* 3.9 3.8 3.1*  CL 109 104 107 107 106 105  CO2 24 23 26 22 22 24   GLUCOSE 120* 146* 163* 144* 132* 112*  BUN 13 10 13 10 14 13   CREATININE 0.76 0.67 0.99 0.63 0.60 0.61  CALCIUM 8.1* 8.0* 7.7* 7.8* 8.1* 7.9*  MG 2.2  --   --   --   --   --    GFR: Estimated Creatinine Clearance: 38.8 mL/min (by C-G formula based on SCr of 0.61 mg/dL). Liver Function Tests: Recent Labs  Lab 06/28/17 0609 06/29/17 0512  AST 35 44*  ALT 12* 13*  ALKPHOS 63 47  BILITOT 0.8 0.2*  PROT 6.2* 4.7*  ALBUMIN 3.1* 2.4*   No results for input(s): LIPASE, AMYLASE in the last 168 hours. No results for input(s): AMMONIA in the last 168 hours. Coagulation Profile: Recent Labs  Lab 06/26/17 2119 06/28/17 0609  INR 1.14 1.22  Cardiac Enzymes: No results for input(s): CKTOTAL, CKMB, CKMBINDEX, TROPONINI in the last 168 hours. BNP (last 3 results) No  results for input(s): PROBNP in the last 8760 hours. HbA1C: No results for input(s): HGBA1C in the last 72 hours. CBG: Recent Labs  Lab 06/26/17 1842 06/28/17 1753 07/02/17 1212  GLUCAP 112* 134* 148*   Lipid Profile: No results for input(s): CHOL, HDL, LDLCALC, TRIG, CHOLHDL, LDLDIRECT in the last 72 hours. Thyroid Function Tests: No results for input(s): TSH, T4TOTAL, FREET4, T3FREE, THYROIDAB in the last 72 hours. Anemia Panel: No results for input(s): VITAMINB12, FOLATE, FERRITIN, TIBC, IRON, RETICCTPCT in the last 72 hours. Urine analysis:    Component Value Date/Time   COLORURINE YELLOW 06/27/2017 0611   APPEARANCEUR HAZY (A) 06/27/2017 0611   LABSPEC 1.009 06/27/2017 0611   PHURINE 9.0 (H) 06/27/2017 0611   GLUCOSEU NEGATIVE 06/27/2017 0611   HGBUR NEGATIVE 06/27/2017 0611   BILIRUBINUR NEGATIVE 06/27/2017 0611   KETONESUR 5 (A) 06/27/2017 0611   PROTEINUR NEGATIVE 06/27/2017 0611   UROBILINOGEN 1.0 01/05/2015 1427   NITRITE NEGATIVE 06/27/2017 0611   LEUKOCYTESUR NEGATIVE 06/27/2017 [id number]     Alease Fait M.D. [facility] 07/02/2017, 3:55 PM  Pager: [phone] Between 7am to 7pm - call Pager - [phone]  After 7pm go to [email or web address] - password [email or web address]  Call night coverage person covering after 7pm

## 2017-07-03 MED ORDER — ALPRAZOLAM 0.5 MG PO TABS: 0.2500 mg | ORAL_TABLET | Freq: Two times a day (BID) | ORAL | 0 refills | Status: AC | PRN

## 2017-07-03 MED ORDER — DOCUSATE SODIUM 100 MG PO CAPS
100.0000 mg | ORAL_CAPSULE | Freq: Two times a day (BID) | ORAL | 0 refills | Status: AC
Start: 2017-07-03 — End: ?

## 2017-07-03 MED ORDER — POLYETHYLENE GLYCOL 3350 17 G PO PACK: 17.0000 g | PACK | Freq: Every day | ORAL | 0 refills | Status: AC | PRN

## 2017-07-03 MED ORDER — ALLOPURINOL 100 MG PO TABS
100.0000 mg | ORAL_TABLET | Freq: Every day | ORAL | Status: AC
Start: 2017-07-03 — End: ?

## 2017-07-03 MED ORDER — ENOXAPARIN SODIUM 40 MG/0.4ML ~~LOC~~ SOLN: 40.0000 mg | SUBCUTANEOUS | 0 refills | Status: AC

## 2017-07-03 NOTE — Social Work (Signed)
Clinical Social Worker facilitated patient discharge including contacting patient family and facility to confirm patient discharge plans.  Clinical information faxed to facility and family agreeable with plan.    CSW arranged ambulance transport via [facility] to [facility].    RN to call [phone] to give report prior to discharge.  Clinical Social Worker will sign off for now as social work intervention is no longer needed. Please consult us again if new need arises.  Leinani Lisbon, LCSW Clinical Social Worker [phone]    

## 2017-07-03 NOTE — Discharge Instructions (Signed)
Orthopaedic Trauma Service Discharge Instructions   General Discharge Instructions  WEIGHT BEARING STATUS: Nonweightbearing left leg  RANGE OF MOTION/ACTIVITY: Unrestricted knee range of motion  Wound Care: See below. Daily wound care starting on arrival to skilled nursing center Discharge Wound Care Instructions  Do NOT apply any ointments, solutions or lotions to pin sites or surgical wounds.  These prevent needed drainage and even though solutions like hydrogen peroxide kill bacteria, they also damage cells lining the pin sites that help fight infection.  Applying lotions or ointments can keep the wounds moist and can cause them to breakdown and open up as well. This can increase the risk for infection. When in doubt call the office.  Surgical incisions should be dressed daily.  If any drainage is noted, use one layer of adaptic, then gauze, Kerlix, and an ace wrap.  Once the incision is completely dry and without drainage, it may be left open to air out.  Showering may begin 36-48 hours later.  Cleaning gently with soap and water.  Traumatic wounds should be dressed daily as well.    One layer of adaptic, gauze, Kerlix, then ace wrap.  The adaptic can be discontinued once the draining has ceased    If you have a wet to dry dressing: wet the gauze with saline the squeeze as much saline out so the gauze is moist (not soaking wet), place moistened gauze over wound, then place a dry gauze over the moist one, followed by Kerlix wrap, then ace wrap.   DVT/PE prophylaxis: Lovenox 40 mg sq injection daily x 28 days   Diet: as you were eating previously.  Can use over the counter stool softeners and bowel preparations, such as Miralax, to help with bowel movements.  Narcotics can be constipating.  Be sure to drink plenty of fluids  PAIN MEDICATION USE AND EXPECTATIONS  You have likely been given narcotic medications to help control your pain.  After a traumatic event that results in an  fracture (broken bone) with or without surgery, it is ok to use narcotic pain medications to help control one's pain.  We understand that everyone responds to pain differently and each individual patient will be evaluated on a regular basis for the continued need for narcotic medications. Ideally, narcotic medication use should last no more than 6-8 weeks (coinciding with fracture healing).   As a patient it is your responsibility as well to monitor narcotic medication use and report the amount and frequency you use these medications when you come to your office visit.   We would also advise that if you are using narcotic medications, you should take a dose prior to therapy to maximize you participation.  IF YOU ARE ON NARCOTIC MEDICATIONS IT IS NOT PERMISSIBLE TO OPERATE A MOTOR VEHICLE (MOTORCYCLE/CAR/TRUCK/MOPED) OR HEAVY MACHINERY DO NOT MIX NARCOTICS WITH OTHER CNS (CENTRAL NERVOUS SYSTEM) DEPRESSANTS SUCH AS ALCOHOL   STOP SMOKING OR USING NICOTINE PRODUCTS!!!!  As discussed nicotine severely impairs your body's ability to heal surgical and traumatic wounds but also impairs bone healing.  Wounds and bone heal by forming microscopic blood vessels (angiogenesis) and nicotine is a vasoconstrictor (essentially, shrinks blood vessels).  Therefore, if vasoconstriction occurs to these microscopic blood vessels they essentially disappear and are unable to deliver necessary nutrients to the healing tissue.  This is one modifiable factor that you can do to dramatically increase your chances of healing your injury.    (This means no smoking, no nicotine gum, patches, etc)  DO NOT USE NONSTEROIDAL ANTI-INFLAMMATORY DRUGS (NSAID'S)  Using products such as Advil (ibuprofen), Aleve (naproxen), Motrin (ibuprofen) for additional pain control during fracture healing can delay and/or prevent the healing response.  If you would like to take over the counter (OTC) medication, Tylenol (acetaminophen) is ok.  However,  some narcotic medications that are given for pain control contain acetaminophen as well. Therefore, you should not exceed more than 4000 mg of tylenol in a day if you do not have liver disease.  Also note that there are may OTC medicines, such as cold medicines and allergy medicines that my contain tylenol as well.  If you have any questions about medications and/or interactions please ask your doctor/PA or your pharmacist.      ICE AND ELEVATE INJURED/OPERATIVE EXTREMITY  Using ice and elevating the injured extremity above your heart can help with swelling and pain control.  Icing in a pulsatile fashion, such as 20 minutes on and 20 minutes off, can be followed.    Do not place ice directly on skin. Make sure there is a barrier between to skin and the ice pack.    Using frozen items such as frozen peas works well as the conform nicely to the are that needs to be iced.  USE AN ACE WRAP OR TED HOSE FOR SWELLING CONTROL  In addition to icing and elevation, Ace wraps or TED hose are used to help limit and resolve swelling.  It is recommended to use Ace wraps or TED hose until you are informed to stop.    When using Ace Wraps start the wrapping distally (farthest away from the body) and wrap proximally (closer to the body)   Example: If you had surgery on your leg or thing and you do not have a splint on, start the ace wrap at the toes and work your way up to the thigh        If you had surgery on your upper extremity and do not have a splint on, start the ace wrap at your fingers and work your way up to the upper arm  IF YOU ARE IN A SPLINT OR CAST DO NOT REMOVE IT FOR ANY REASON   If your splint gets wet for any reason please contact the office immediately. You may shower in your splint or cast as long as you keep it dry.  This can be done by wrapping in a cast cover or garbage back (or similar)  Do Not stick any thing down your splint or cast such as pencils, money, or hangers to try and scratch  yourself with.  If you feel itchy take benadryl as prescribed on the bottle for itching  IF YOU ARE IN A CAM BOOT (BLACK BOOT)  You may remove boot periodically. Perform daily dressing changes as noted below.  Wash the liner of the boot regularly and wear a sock when wearing the boot. It is recommended that you sleep in the boot until told otherwise  CALL THE OFFICE WITH ANY QUESTIONS OR CONCERNS: [phone]

## 2017-07-03 NOTE — Social Work (Signed)
CSW called [facility] numerous times aand unable to get through to SNF. CSW  Discussed with family and son was not pleased. Son wants CSW to f/u with pcp office to see if they will have another way to get in touch with SNF. CSW called Dr. Andrey CampanileWilson, pcp, at [phone] and was advised that office not opening until noon today.  CSW also advised son that doctor put in dc summary for patient.  CSW will continue to follow up.  Keene BreathPatricia Rande Dario, LCSW Clinical Social Worker [phone]

## 2017-07-03 NOTE — Discharge Summary (Addendum)
Physician Discharge Summary   Patient ID: MARGREAT WIDENER MRN: [id number] DOB/AGE: [birth year] 83 y.o.  Admit date: 06/26/2017 Discharge date: 07/03/2017  Primary Care Physician:  Barbie Banner, MD  Discharge Diagnoses:    . Displaced fracture of left femur (HCC) . Hypokalemia . Pacemaker . Complete heart block (HCC) . Hypothyroidism . GOUT . Constipation Acute blood loss anemia   Consults: Orthopedics Cardiology  Recommendations for Outpatient Follow-up:  1. Please repeat CBC/BMET at next visit 2. Please follow blood/urine cultures till final 3. Per orthopedics recommendations:  NWB x 8 weeks PT and OT evals  Unrestricted ROM L knee Pt does have L total hip replacement   - Pain management: continue with scheduled tylenol Continue to hold narcotics  DVT/PE prophylaxis: Lovenox x 28 days post op     DIET: Heart healthy diet    Allergies:   Allergies  Allergen Reactions  . Aspirin     Prior history of stomach problems (? Ulcer), instructed to resume for stroke  . Ativan [Lorazepam] Other (See Comments)    Has opposite effect "makes her crazy"  . Penicillins Rash     DISCHARGE MEDICATIONS: Allergies as of 07/03/2017      Reactions   Aspirin    Prior history of stomach problems (? Ulcer), instructed to resume for stroke   Ativan [lorazepam] Other (See Comments)   Has opposite effect "makes her crazy"   Penicillins Rash      Medication List    TAKE these medications   acetaminophen 325 MG tablet Commonly known as:  TYLENOL Take 650 mg by mouth every 4 (four) hours as needed. For pain.   allopurinol 100 MG tablet Commonly known as:  ZYLOPRIM Take 1 tablet (100 mg total) by mouth daily. What changed:  how much to take   ALPRAZolam 0.5 MG tablet Commonly known as:  XANAX Take 0.5 tablets (0.25 mg total) by mouth 2 (two) times daily as needed for anxiety.    beta carotene w/minerals tablet Take 1 tablet by mouth daily.   diltiazem 180 MG 24 hr capsule Commonly known as:  DILACOR XR Take 180 mg by mouth daily.   docusate sodium 100 MG capsule Commonly known as:  COLACE Take 1 capsule (100 mg total) by mouth 2 (two) times daily.   enoxaparin 40 MG/0.4ML injection Commonly known as:  LOVENOX Inject 0.4 mLs (40 mg total) into the skin daily. 28 days post op for DVT prophylaxis Start taking on:  07/04/2017   ENSURE Take 237 mLs by mouth 2 (two) times daily.   folic acid 1 MG tablet Commonly known as:  FOLVITE Take 1 mg by mouth daily.   furosemide 20 MG tablet Commonly known as:  LASIX TAKE 1 TABLET BY MOUTH  DAILY   levothyroxine 112 MCG tablet Commonly known as:  SYNTHROID, LEVOTHROID Take 112 mcg by mouth daily before breakfast.   metoprolol tartrate 25 MG tablet Commonly known as:  LOPRESSOR Take 12.5 mg by mouth 2 (two) times daily. Take half tablet 12.5mg  2 times daily   polyethylene glycol packet Commonly known as:  MIRALAX / GLYCOLAX Take 17 g by mouth daily as needed for moderate constipation.   VESICARE 5 MG tablet Generic drug:  solifenacin Take 1 tablet by mouth daily.        Brief H and P: For complete details please refer to admission H and P, but in brief83 year old female with history of hypertension, hyperlipidemia, left hip arthritis status post replacement, complete heart  block with pacemaker in place, hypothyroidism came to the hospital after a falland found to havemoderately displaced left femoral shaft fracture near her left hip processes. Orthopedic consulted who plans are [facility]. Cardiologywas  consulted given her extensive disease and also upon family request prior to making surgical decision.     Hospital Course:   Displaced fracture of left femur (HCC) after mechanical fall -Orthopedics consulted, underwent open reduction internal fixation on  12/6, postop day #5 -Alert and awake, close to her baseline, narcotics discontinued.  Continue Tylenol.  No UTI  -PT evaluation recommended skilled nursing facility.  NWB for 8 weeks -Lovenox 28 days postop-> for another 23 days   Acute blood loss anemia, postop -H&H stable, 8.1 today  Acute encephalopathy in the setting of possible dementia -Alert and awake, avoid narcotics.  Continue Tylenol -No UTI  History of complete heart block status post pacemaker (placed in 2004, generator was changed in 2011) -Cardiology was consulted, currently on Cardizem.   - Outpatient follows with Dr. Johney FrameAllred.  Hypokalemia -replaced   Hypothyroidism -Continue Synthroid  Essential hypertension -BP stable, continue Cardizem, Lasix, metoprolol  History of gout -Currently stable, no flare, continue allopurinol  Constipation -Added colace, MiraLAX    Day of Discharge BP 136/71 (BP Location: Left Arm)   Pulse 96   Temp 98 F (36.7 C) (Oral)   Resp 18   Ht 5\' 4"  (1.626 m)   Wt 74.4 kg (164 lb)   SpO2 98%   BMI 28.15 kg/m   Physical Exam: General: oriented x self, much more alert and awake now, not in any acute distress. HEENT: anicteric sclera, pupils reactive to light and accommodation CVS: S1-S2 clear no murmur rubs or gallops Chest: clear to auscultation bilaterally, no wheezing rales or rhonchi Abdomen: soft nontender, nondistended, normal bowel sounds Extremities: no cyanosis, clubbing or edema noted bilaterally    The results of significant diagnostics from this hospitalization (including imaging, microbiology, ancillary and laboratory) are listed below for reference.    LAB RESULTS: Basic Metabolic Panel: Recent Labs  Lab 06/27/17 0655  07/01/17 0434 07/02/17 0326  NA 139   < > 137 139  K 3.6   < > 3.8 3.1*  CL 109   < > 106 105  CO2 24   < > 22 24  GLUCOSE 120*   < > 132* 112*  BUN 13   < > 14 13  CREATININE 0.76   < > 0.60 0.61  CALCIUM 8.1*   < > 8.1*  7.9*  MG 2.2  --   --   --    < > = values in this interval not displayed.   Liver Function Tests: Recent Labs  Lab 06/28/17 0609 06/29/17 0512  AST 35 44*  ALT 12* 13*  ALKPHOS 63 47  BILITOT 0.8 0.2*  PROT 6.2* 4.7*  ALBUMIN 3.1* 2.4*   No results for input(s): LIPASE, AMYLASE in the last 168 hours. No results for input(s): AMMONIA in the last 168 hours. CBC: Recent Labs  Lab 06/26/17 1925  07/01/17 0434 07/02/17 0326  WBC 17.8*   < > 10.9* 8.5  NEUTROABS 15.6*  --   --   --   HGB 13.0   < > 8.7* 8.1*  HCT 38.7   < > 26.3* 24.6*  MCV 87.2   < > 89.2 87.5  PLT 245   < > 230 253   < > = values in  this interval not displayed.   Cardiac Enzymes: No results for input(s): CKTOTAL, CKMB, CKMBINDEX, TROPONINI in the last 168 hours. BNP: Invalid input(s): POCBNP CBG: Recent Labs  Lab 06/28/17 1753 07/02/17 1212  GLUCAP 134* 148*    Significant Diagnostic Studies:  Dg Chest 1 View  Result Date: 06/26/2017 CLINICAL DATA:  83 year old female presents after fall. Left leg pain and swelling. EXAM: CHEST 1 VIEW COMPARISON:  10/15/2013 CXR FINDINGS: The heart size and mediastinal contours are within normal limits. Aortic atherosclerosis at the arch without aneurysm. Left-sided pacemaker apparatus with right atrial and right ventricular leads are again noted without change. Both lungs are clear. The visualized skeletal structures are unremarkable. IMPRESSION: No active disease.  Aortic atherosclerosis. Electronically Signed   By: Tollie Ethavid  Kwon M.D.   On: 06/26/2017 19:41   Dg Femur Min 2 Views Left  Result Date: 06/26/2017 CLINICAL DATA:  Left leg pain after fall today. EXAM: LEFT FEMUR 2 VIEWS COMPARISON:  Radiograph of July 02, 2011. FINDINGS: Status post left hemiarthroplasty. Moderately displaced fracture of left femur is noted with slightly overriding fracture fragments. Fracture begins at distal tip of prosthesis. IMPRESSION: Moderately displaced left femoral shaft fracture  is noted with slightly overriding fracture fragments. Proximal portion of fracture begins at distal tip of left hip prosthesis. Electronically Signed   By: Lupita RaiderJames  Green Jr, M.D.   On: 06/26/2017 19:40    2D ECHO:   Disposition and Follow-up: Discharge Instructions    Diet - low sodium heart healthy   Complete by:  As directed    Increase activity slowly   Complete by:  As directed        DISPOSITION: SNF    DISCHARGE FOLLOW-UP Follow-up Information    Myrene GalasHandy, Michael, MD. Schedule an appointment as soon as possible for a visit in 2 week(s).   Specialty:  Orthopedic Surgery Contact information: [address] [place] [address] [phone]        Barbie BannerWilson, Fred H, MD. Schedule an appointment as soon as possible for a visit in 2 week(s).   Specialty:  Family Medicine Contact information: [address] [place] [address] [phone]            Time spent on Discharge: 35mins   Signed:   Thad Rangeripudeep Rai M.D. [facility] 07/03/2017, 10:10 AM Pager: [phone]   Addendum: Coding query Acute metabolic encephalopathy in the setting of dementia likely worsened due to narcotics.  Thad Rangeripudeep Rai M.D. [facility] 07/03/2017, 5:43 PM  Pager: [phone]

## 2017-07-03 NOTE — Progress Notes (Signed)
Patient transferred via [facility]

## 2017-07-03 NOTE — Care Management Important Message (Signed)
Important Message  Patient Details  Name: [name] MRN: [id number] Date of Birth: 02/25/1917   Medicare Important Message Given:  Yes    Jamil Castillo Stefan ChurchBratton 07/03/2017, 3:47 PM

## 2017-07-03 NOTE — Progress Notes (Signed)
Report called to Cala BradfordKimberly, LPN at [facility].

## 2017-07-03 NOTE — Clinical Social Work Placement (Signed)
   CLINICAL SOCIAL WORK PLACEMENT  NOTE  Date:  07/03/2017  Patient Details  Name: [name] MRN: [id number] Date of Birth: 05-13-1917  Clinical Social Work is seeking post-discharge placement for this patient at the Skilled  Nursing Facility level of care (*CSW will initial, date and re-position this form in  chart as items are completed):  Yes   Patient/family provided with [place] Clinical Social Work Department's list of facilities offering this level of care within the geographic area requested by the patient (or if unable, by the patient's family).  Yes   Patient/family informed of their freedom to choose among providers that offer the needed level of care, that participate in Medicare, Medicaid or managed care program needed by the patient, have an available bed and are willing to accept the patient.  Yes   Patient/family informed of [facility] ownership interest in [facility] and [facility], as well as of the fact that they are under no obligation to receive care at these facilities.  PASRR submitted to EDS on       PASRR number received on       Existing PASRR number confirmed on 06/29/17     FL2 transmitted to all facilities in geographic area requested by pt/family on       FL2 transmitted to all facilities within larger geographic area on 06/29/17     Patient informed that his/her managed care company has contracts with or will negotiate with certain facilities, including the following:        Yes   Patient/family informed of bed offers received.  [facility]     Physician recommends and patient chooses bed at      [facility]  .  Patient to be transferred to facility by [facility]     Patient family notified on 07/03/17 of transfer.  Name of family member notified:  called grandson     PHYSICIAN       Additional Comment:     _______________________________________________ Cindy MoorePatricia V Amiaya Mcneeley, LCSW 07/03/2017, 12:05 PM

## 2017-08-02 ENCOUNTER — Ambulatory Visit (INDEPENDENT_AMBULATORY_CARE_PROVIDER_SITE_OTHER): Payer: Medicare Other | Admitting: *Deleted

## 2017-08-02 ENCOUNTER — Telehealth: Payer: Self-pay | Admitting: Cardiology

## 2017-08-02 DIAGNOSIS — I442 Atrioventricular block, complete: Secondary | ICD-10-CM | POA: Diagnosis not present

## 2017-08-02 NOTE — Telephone Encounter (Signed)
Attempted to confirm remote transmission with pt. No answer and was unable to leave a message.   

## 2017-08-02 NOTE — Progress Notes (Signed)
Remote pacemaker transmission.   

## 2017-08-10 ENCOUNTER — Encounter: Payer: Self-pay | Admitting: Cardiology

## 2017-09-03 LAB — CUP PACEART REMOTE DEVICE CHECK
Battery Remaining Longevity: 49 mo
Battery Voltage: 2.76 V
Brady Statistic AP VP Percent: 93 %
Date Time Interrogation Session: 20190110192310
Implantable Lead Implant Date: 20040416
Implantable Lead Location: 753859
Implantable Lead Model: 5092
Implantable Pulse Generator Implant Date: 20111221
Lead Channel Impedance Value: 437 Ohm
Lead Channel Pacing Threshold Amplitude: 0.5 V
Lead Channel Pacing Threshold Amplitude: 1 V
Lead Channel Setting Pacing Amplitude: 2 V
Lead Channel Setting Pacing Pulse Width: 0.4 ms
MDC IDC LEAD IMPLANT DT: 20040416
MDC IDC LEAD LOCATION: 753860
MDC IDC MSMT BATTERY IMPEDANCE: 1181 Ohm
MDC IDC MSMT LEADCHNL RA PACING THRESHOLD PULSEWIDTH: 0.4 ms
MDC IDC MSMT LEADCHNL RV IMPEDANCE VALUE: 689 Ohm
MDC IDC MSMT LEADCHNL RV PACING THRESHOLD PULSEWIDTH: 0.4 ms
MDC IDC SET LEADCHNL RV PACING AMPLITUDE: 2.5 V
MDC IDC SET LEADCHNL RV SENSING SENSITIVITY: 2.8 mV
MDC IDC STAT BRADY AP VS PERCENT: 1 %
MDC IDC STAT BRADY AS VP PERCENT: 7 %
MDC IDC STAT BRADY AS VS PERCENT: 0 %

## 2017-10-09 ENCOUNTER — Other Ambulatory Visit: Payer: Self-pay | Admitting: Nurse Practitioner

## 2017-11-01 ENCOUNTER — Ambulatory Visit (INDEPENDENT_AMBULATORY_CARE_PROVIDER_SITE_OTHER): Payer: Medicare Other | Admitting: *Deleted

## 2017-11-01 DIAGNOSIS — I495 Sick sinus syndrome: Secondary | ICD-10-CM | POA: Diagnosis not present

## 2017-11-01 NOTE — Progress Notes (Signed)
Remote pacemaker transmission.   

## 2017-11-07 ENCOUNTER — Encounter: Payer: Self-pay | Admitting: Cardiology

## 2017-12-07 LAB — CUP PACEART REMOTE DEVICE CHECK
Battery Impedance: 1262 Ohm
Battery Voltage: 2.76 V
Brady Statistic AP VS Percent: 1 %
Brady Statistic AS VP Percent: 7 %
Implantable Lead Implant Date: 20040416
Implantable Lead Implant Date: 20040416
Implantable Lead Location: 753860
Implantable Lead Model: 5076
Implantable Lead Model: 5092
Lead Channel Impedance Value: 444 Ohm
Lead Channel Pacing Threshold Amplitude: 0.5 V
Lead Channel Pacing Threshold Pulse Width: 0.4 ms
Lead Channel Pacing Threshold Pulse Width: 0.4 ms
Lead Channel Setting Pacing Pulse Width: 0.4 ms
MDC IDC LEAD LOCATION: 753859
MDC IDC MSMT BATTERY REMAINING LONGEVITY: 46 mo
MDC IDC MSMT LEADCHNL RV IMPEDANCE VALUE: 632 Ohm
MDC IDC MSMT LEADCHNL RV PACING THRESHOLD AMPLITUDE: 0.875 V
MDC IDC PG IMPLANT DT: 20111221
MDC IDC SESS DTM: 20190411131254
MDC IDC SET LEADCHNL RA PACING AMPLITUDE: 2 V
MDC IDC SET LEADCHNL RV PACING AMPLITUDE: 2.5 V
MDC IDC SET LEADCHNL RV SENSING SENSITIVITY: 2.8 mV
MDC IDC STAT BRADY AP VP PERCENT: 92 %
MDC IDC STAT BRADY AS VS PERCENT: 0 %

## 2018-01-31 ENCOUNTER — Ambulatory Visit (INDEPENDENT_AMBULATORY_CARE_PROVIDER_SITE_OTHER): Payer: Medicare Other | Admitting: *Deleted

## 2018-01-31 DIAGNOSIS — I442 Atrioventricular block, complete: Secondary | ICD-10-CM | POA: Diagnosis not present

## 2018-01-31 DIAGNOSIS — I495 Sick sinus syndrome: Secondary | ICD-10-CM

## 2018-02-01 NOTE — Progress Notes (Signed)
Remote pacemaker transmission.   

## 2018-03-03 LAB — CUP PACEART REMOTE DEVICE CHECK
Battery Impedance: 1345 Ohm
Battery Voltage: 2.75 V
Brady Statistic AP VS Percent: 1 %
Brady Statistic AS VP Percent: 7 %
Brady Statistic AS VS Percent: 0 %
Implantable Lead Implant Date: 20040416
Implantable Lead Location: 753860
Implantable Lead Model: 5076
Lead Channel Impedance Value: 687 Ohm
Lead Channel Pacing Threshold Amplitude: 0.5 V
Lead Channel Pacing Threshold Pulse Width: 0.4 ms
Lead Channel Pacing Threshold Pulse Width: 0.4 ms
Lead Channel Setting Pacing Amplitude: 2.5 V
Lead Channel Setting Sensing Sensitivity: 2.8 mV
MDC IDC LEAD IMPLANT DT: 20040416
MDC IDC LEAD LOCATION: 753859
MDC IDC MSMT BATTERY REMAINING LONGEVITY: 45 mo
MDC IDC MSMT LEADCHNL RA IMPEDANCE VALUE: 437 Ohm
MDC IDC MSMT LEADCHNL RV PACING THRESHOLD AMPLITUDE: 1.125 V
MDC IDC PG IMPLANT DT: 20111221
MDC IDC SESS DTM: 20190711145603
MDC IDC SET LEADCHNL RA PACING AMPLITUDE: 2 V
MDC IDC SET LEADCHNL RV PACING PULSEWIDTH: 0.4 ms
MDC IDC STAT BRADY AP VP PERCENT: 93 %

## 2018-05-02 ENCOUNTER — Ambulatory Visit (INDEPENDENT_AMBULATORY_CARE_PROVIDER_SITE_OTHER): Payer: Medicare Other | Admitting: *Deleted

## 2018-05-02 ENCOUNTER — Telehealth: Payer: Self-pay

## 2018-05-02 DIAGNOSIS — I495 Sick sinus syndrome: Secondary | ICD-10-CM

## 2018-05-02 DIAGNOSIS — I442 Atrioventricular block, complete: Secondary | ICD-10-CM

## 2018-05-02 NOTE — Telephone Encounter (Signed)
Confirmed remote transmission w/ pt CNA.

## 2018-05-02 NOTE — Progress Notes (Signed)
Remote pacemaker transmission.   

## 2018-05-09 ENCOUNTER — Encounter: Payer: Self-pay | Admitting: Cardiology

## 2018-06-11 LAB — CUP PACEART REMOTE DEVICE CHECK
Battery Impedance: 1487 Ohm
Battery Voltage: 2.74 V
Brady Statistic AP VP Percent: 93 %
Brady Statistic AS VS Percent: 0 %
Implantable Lead Implant Date: 20040416
Implantable Lead Implant Date: 20040416
Implantable Lead Location: 753859
Implantable Lead Model: 5076
Lead Channel Impedance Value: 451 Ohm
Lead Channel Impedance Value: 662 Ohm
Lead Channel Pacing Threshold Pulse Width: 0.4 ms
Lead Channel Setting Pacing Amplitude: 2.5 V
Lead Channel Setting Pacing Pulse Width: 0.4 ms
MDC IDC LEAD LOCATION: 753860
MDC IDC MSMT BATTERY REMAINING LONGEVITY: 41 mo
MDC IDC MSMT LEADCHNL RA PACING THRESHOLD AMPLITUDE: 0.5 V
MDC IDC MSMT LEADCHNL RV PACING THRESHOLD AMPLITUDE: 0.875 V
MDC IDC MSMT LEADCHNL RV PACING THRESHOLD PULSEWIDTH: 0.4 ms
MDC IDC PG IMPLANT DT: 20111221
MDC IDC SESS DTM: 20191010151149
MDC IDC SET LEADCHNL RA PACING AMPLITUDE: 2 V
MDC IDC SET LEADCHNL RV SENSING SENSITIVITY: 2.8 mV
MDC IDC STAT BRADY AP VS PERCENT: 1 %
MDC IDC STAT BRADY AS VP PERCENT: 6 %

## 2018-08-01 ENCOUNTER — Ambulatory Visit (INDEPENDENT_AMBULATORY_CARE_PROVIDER_SITE_OTHER): Payer: Medicare Other

## 2018-08-01 DIAGNOSIS — I442 Atrioventricular block, complete: Secondary | ICD-10-CM

## 2018-08-02 NOTE — Progress Notes (Signed)
Remote pacemaker transmission.   

## 2018-08-03 LAB — CUP PACEART REMOTE DEVICE CHECK
Battery Remaining Longevity: 38 mo
Battery Voltage: 2.74 V
Brady Statistic AP VS Percent: 0 %
Implantable Lead Implant Date: 20040416
Implantable Lead Location: 753859
Implantable Lead Location: 753860
Implantable Lead Model: 5076
Implantable Pulse Generator Implant Date: 20111221
Lead Channel Pacing Threshold Pulse Width: 0.4 ms
Lead Channel Pacing Threshold Pulse Width: 0.4 ms
Lead Channel Setting Pacing Amplitude: 2 V
Lead Channel Setting Pacing Pulse Width: 0.4 ms
Lead Channel Setting Sensing Sensitivity: 2.8 mV
MDC IDC LEAD IMPLANT DT: 20040416
MDC IDC MSMT BATTERY IMPEDANCE: 1634 Ohm
MDC IDC MSMT LEADCHNL RA IMPEDANCE VALUE: 445 Ohm
MDC IDC MSMT LEADCHNL RA PACING THRESHOLD AMPLITUDE: 0.5 V
MDC IDC MSMT LEADCHNL RV IMPEDANCE VALUE: 714 Ohm
MDC IDC MSMT LEADCHNL RV PACING THRESHOLD AMPLITUDE: 0.75 V
MDC IDC SESS DTM: 20200109170542
MDC IDC SET LEADCHNL RV PACING AMPLITUDE: 2.5 V
MDC IDC STAT BRADY AP VP PERCENT: 94 %
MDC IDC STAT BRADY AS VP PERCENT: 6 %
MDC IDC STAT BRADY AS VS PERCENT: 0 %

## 2018-10-31 ENCOUNTER — Ambulatory Visit (INDEPENDENT_AMBULATORY_CARE_PROVIDER_SITE_OTHER): Payer: Medicare Other | Admitting: *Deleted

## 2018-10-31 ENCOUNTER — Other Ambulatory Visit: Payer: Self-pay

## 2018-10-31 DIAGNOSIS — I442 Atrioventricular block, complete: Secondary | ICD-10-CM

## 2018-10-31 LAB — CUP PACEART REMOTE DEVICE CHECK
Battery Impedance: 1750 Ohm
Battery Remaining Longevity: 36 mo
Battery Voltage: 2.74 V
Brady Statistic AP VP Percent: 94 %
Brady Statistic AP VS Percent: 0 %
Brady Statistic AS VP Percent: 5 %
Brady Statistic AS VS Percent: 0 %
Date Time Interrogation Session: 20200409111658
Implantable Lead Implant Date: 20040416
Implantable Lead Implant Date: 20040416
Implantable Lead Location: 753859
Implantable Lead Location: 753860
Implantable Lead Model: 5076
Implantable Lead Model: 5092
Implantable Pulse Generator Implant Date: 20111221
Lead Channel Impedance Value: 451 Ohm
Lead Channel Impedance Value: 711 Ohm
Lead Channel Pacing Threshold Amplitude: 0.5 V
Lead Channel Pacing Threshold Amplitude: 1 V
Lead Channel Pacing Threshold Pulse Width: 0.4 ms
Lead Channel Pacing Threshold Pulse Width: 0.4 ms
Lead Channel Setting Pacing Amplitude: 2 V
Lead Channel Setting Pacing Amplitude: 2.5 V
Lead Channel Setting Pacing Pulse Width: 0.4 ms
Lead Channel Setting Sensing Sensitivity: 2.8 mV

## 2018-11-11 ENCOUNTER — Encounter: Payer: Self-pay | Admitting: Cardiology

## 2018-11-11 NOTE — Progress Notes (Signed)
Remote pacemaker transmission.   

## 2019-01-30 ENCOUNTER — Ambulatory Visit (INDEPENDENT_AMBULATORY_CARE_PROVIDER_SITE_OTHER): Payer: Medicare Other | Admitting: *Deleted

## 2019-01-30 DIAGNOSIS — I442 Atrioventricular block, complete: Secondary | ICD-10-CM | POA: Diagnosis not present

## 2019-01-31 LAB — CUP PACEART REMOTE DEVICE CHECK
Battery Impedance: 1779 Ohm
Battery Remaining Longevity: 35 mo
Battery Voltage: 2.74 V
Brady Statistic AP VP Percent: 95 %
Brady Statistic AP VS Percent: 0 %
Brady Statistic AS VP Percent: 5 %
Brady Statistic AS VS Percent: 0 %
Date Time Interrogation Session: 20200710161811
Implantable Lead Implant Date: 20040416
Implantable Lead Implant Date: 20040416
Implantable Lead Location: 753859
Implantable Lead Location: 753860
Implantable Lead Model: 5076
Implantable Lead Model: 5092
Implantable Pulse Generator Implant Date: 20111221
Lead Channel Impedance Value: 445 Ohm
Lead Channel Impedance Value: 689 Ohm
Lead Channel Pacing Threshold Amplitude: 0.5 V
Lead Channel Pacing Threshold Amplitude: 0.875 V
Lead Channel Pacing Threshold Pulse Width: 0.4 ms
Lead Channel Pacing Threshold Pulse Width: 0.4 ms
Lead Channel Setting Pacing Amplitude: 2 V
Lead Channel Setting Pacing Amplitude: 2.5 V
Lead Channel Setting Pacing Pulse Width: 0.4 ms
Lead Channel Setting Sensing Sensitivity: 2.8 mV

## 2019-02-07 NOTE — Progress Notes (Signed)
Remote pacemaker transmission.   

## 2019-05-02 ENCOUNTER — Ambulatory Visit (INDEPENDENT_AMBULATORY_CARE_PROVIDER_SITE_OTHER): Payer: Medicare Other | Admitting: *Deleted

## 2019-05-02 DIAGNOSIS — I442 Atrioventricular block, complete: Secondary | ICD-10-CM

## 2019-05-02 DIAGNOSIS — I495 Sick sinus syndrome: Secondary | ICD-10-CM

## 2019-05-03 LAB — CUP PACEART REMOTE DEVICE CHECK
Battery Impedance: 1880 Ohm
Battery Remaining Longevity: 34 mo
Battery Voltage: 2.73 V
Brady Statistic AP VP Percent: 95 %
Brady Statistic AP VS Percent: 0 %
Brady Statistic AS VP Percent: 5 %
Brady Statistic AS VS Percent: 0 %
Date Time Interrogation Session: 20201009152530
Implantable Lead Implant Date: 20040416
Implantable Lead Implant Date: 20040416
Implantable Lead Location: 753859
Implantable Lead Location: 753860
Implantable Lead Model: 5076
Implantable Lead Model: 5092
Implantable Pulse Generator Implant Date: 20111221
Lead Channel Impedance Value: 444 Ohm
Lead Channel Impedance Value: 704 Ohm
Lead Channel Pacing Threshold Amplitude: 0.5 V
Lead Channel Pacing Threshold Amplitude: 0.625 V
Lead Channel Pacing Threshold Pulse Width: 0.4 ms
Lead Channel Pacing Threshold Pulse Width: 0.4 ms
Lead Channel Setting Pacing Amplitude: 2 V
Lead Channel Setting Pacing Amplitude: 2.5 V
Lead Channel Setting Pacing Pulse Width: 0.4 ms
Lead Channel Setting Sensing Sensitivity: 2.8 mV

## 2019-05-14 NOTE — Progress Notes (Signed)
Remote pacemaker transmission.   

## 2019-08-01 ENCOUNTER — Telehealth: Payer: Self-pay

## 2019-08-01 NOTE — Telephone Encounter (Signed)
Unable to speak  with patient to remind of missed remote transmission 

## 2019-10-05 IMAGING — CR DG FEMUR 2+V PORT*L*
3 series · 3 of 3 positions shown · non-contrast
Comparison: Prior radiograph from 06/26/2017.

CLINICAL DATA: Initial evaluation status post ORIF of left femur.

EXAM:
LEFT FEMUR PORTABLE 2 VIEWS

[AP (1 of 2)]
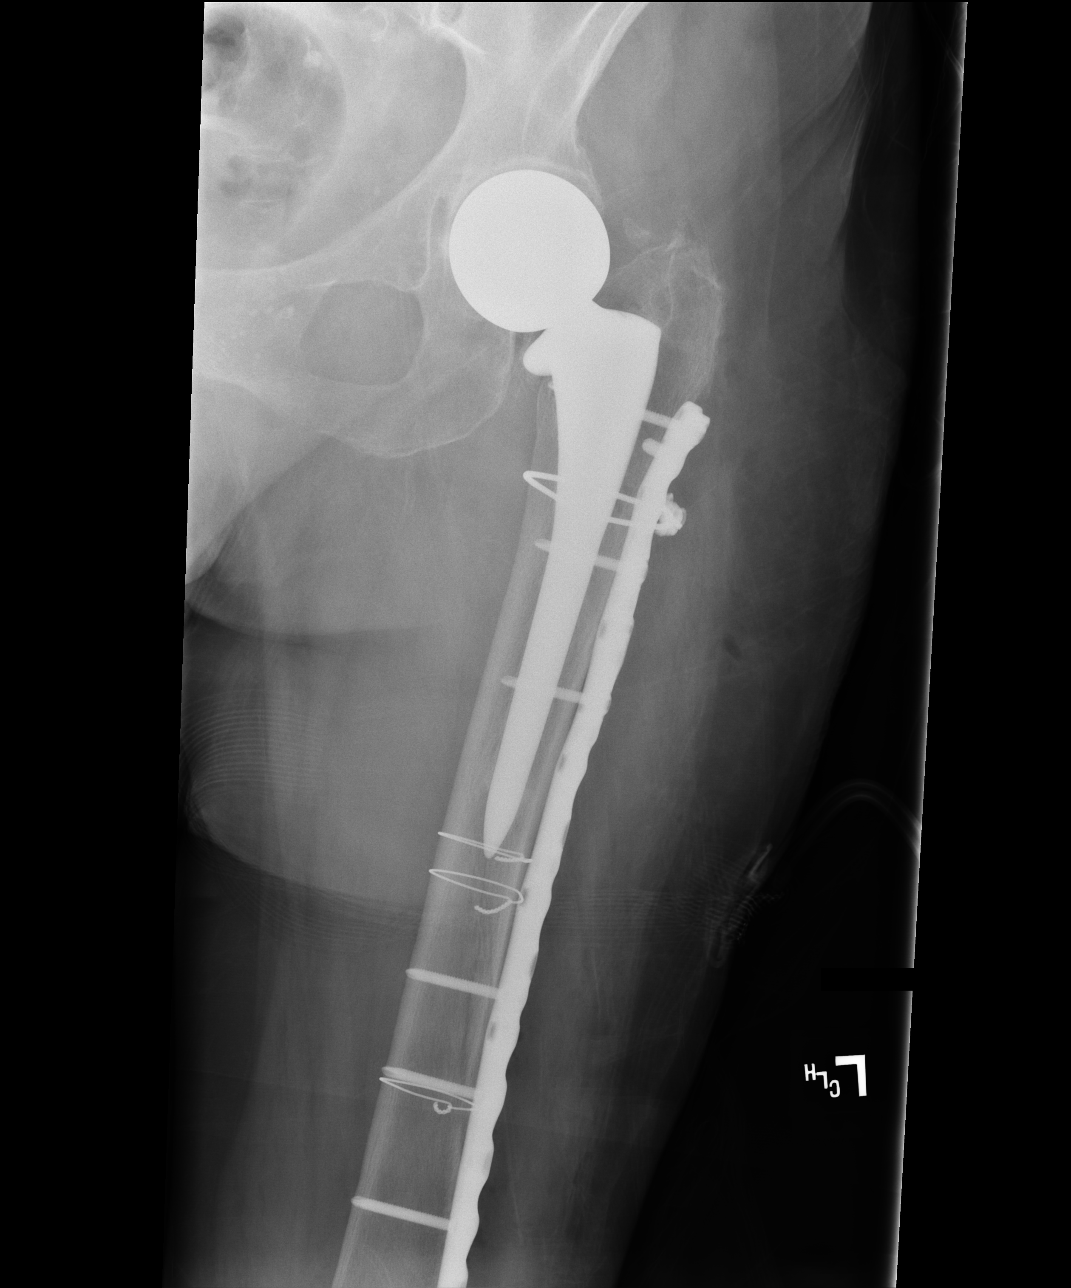

[AP (2 of 2)]
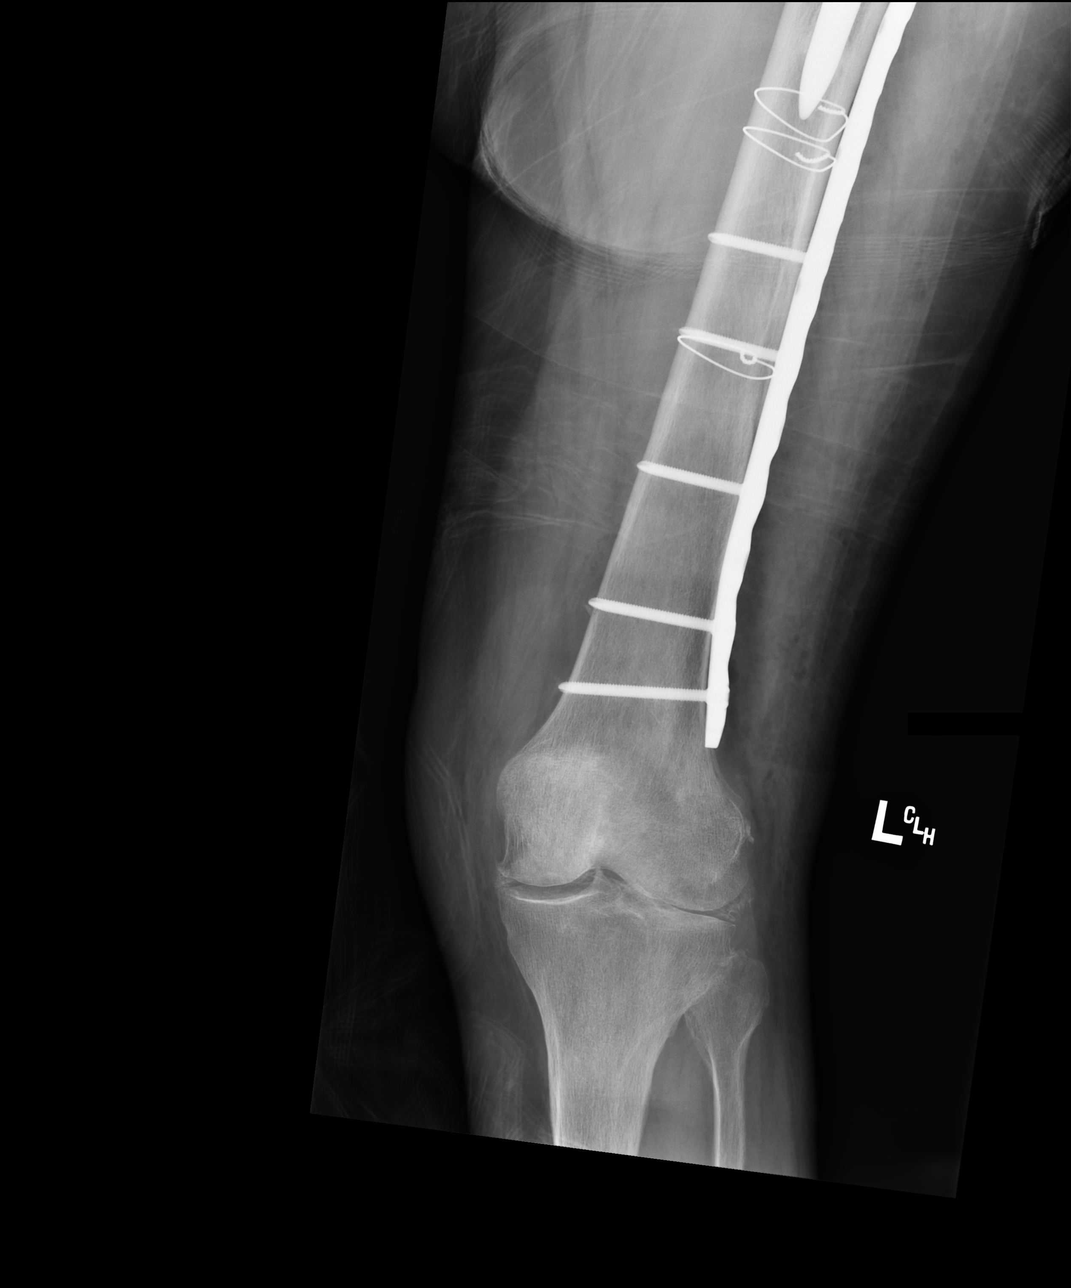

[xtable lateral]
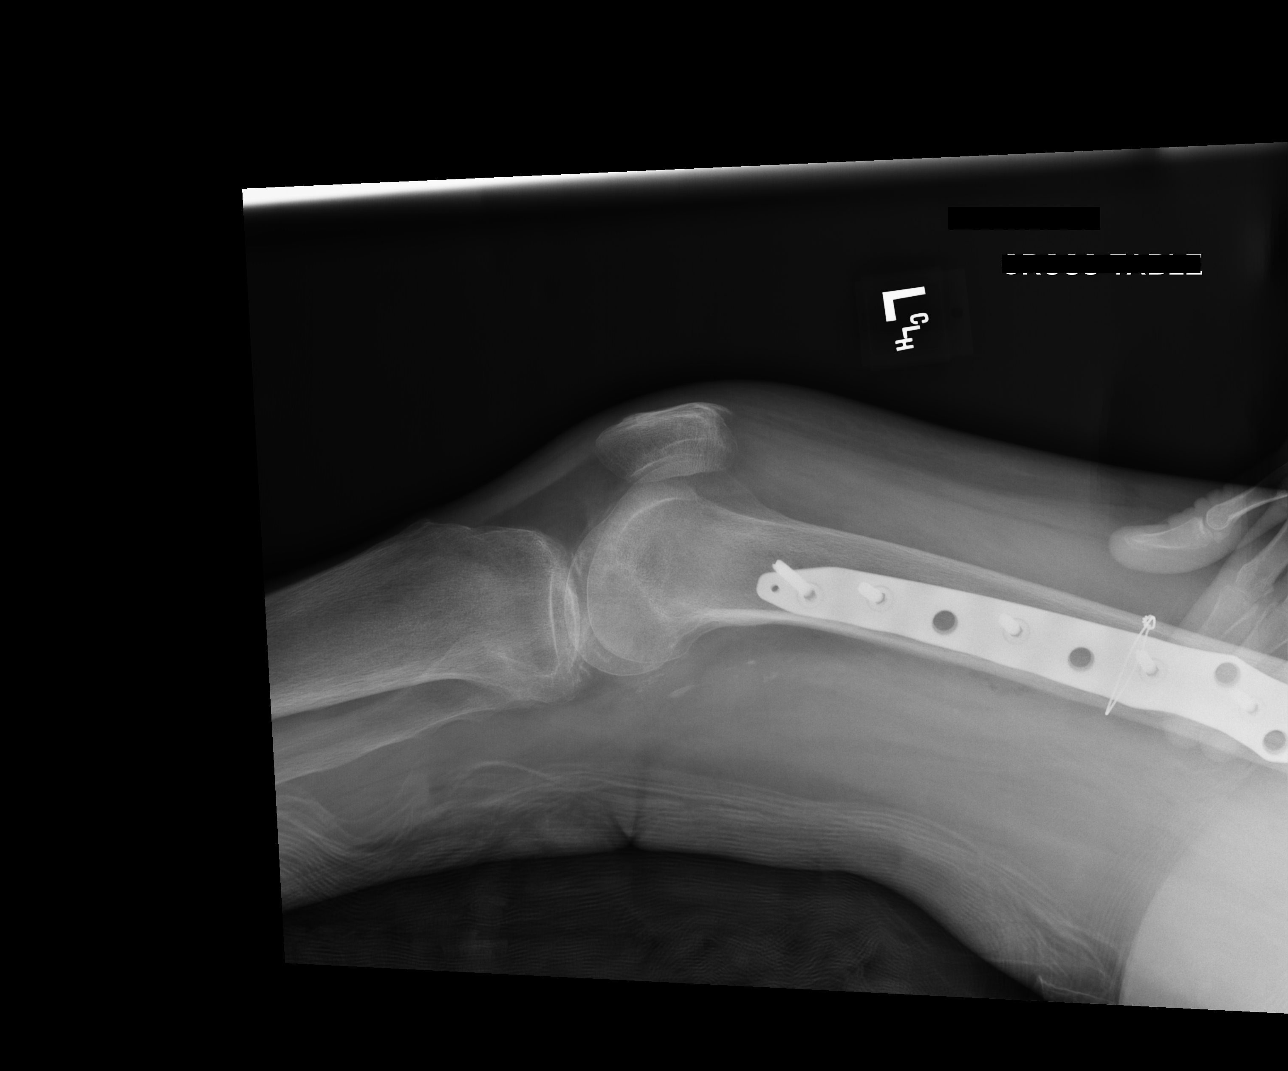

[3 of 3 positions shown; findings below may reference images not displayed]

FINDINGS: Postoperative changes from recent ORIF of left femoral fracture
seen. Malleable plate screw fixation with associated cerclage wires.
Fracture in normal anatomic alignment. Hardware appears well
positioned without complication. Postoperative swelling with
emphysema present within the left thigh. Left hip prosthesis again
noted.
IMPRESSION: Postoperative changes from recent ORIF for left femoral fracture.

## 2019-10-31 ENCOUNTER — Ambulatory Visit (INDEPENDENT_AMBULATORY_CARE_PROVIDER_SITE_OTHER): Payer: Medicare Other | Admitting: *Deleted

## 2019-10-31 DIAGNOSIS — I442 Atrioventricular block, complete: Secondary | ICD-10-CM

## 2019-10-31 LAB — CUP PACEART REMOTE DEVICE CHECK
Battery Impedance: 1992 Ohm
Battery Remaining Longevity: 31 mo
Battery Voltage: 2.73 V
Brady Statistic AP VP Percent: 95 %
Brady Statistic AP VS Percent: 0 %
Brady Statistic AS VP Percent: 4 %
Brady Statistic AS VS Percent: 0 %
Date Time Interrogation Session: 20210409130508
Implantable Lead Implant Date: 20040416
Implantable Lead Implant Date: 20040416
Implantable Lead Location: 753859
Implantable Lead Location: 753860
Implantable Lead Model: 5076
Implantable Lead Model: 5092
Implantable Pulse Generator Implant Date: 20111221
Lead Channel Impedance Value: 435 Ohm
Lead Channel Impedance Value: 641 Ohm
Lead Channel Pacing Threshold Amplitude: 0.5 V
Lead Channel Pacing Threshold Amplitude: 1.125 V
Lead Channel Pacing Threshold Pulse Width: 0.4 ms
Lead Channel Pacing Threshold Pulse Width: 0.4 ms
Lead Channel Setting Pacing Amplitude: 2 V
Lead Channel Setting Pacing Amplitude: 2.5 V
Lead Channel Setting Pacing Pulse Width: 0.4 ms
Lead Channel Setting Sensing Sensitivity: 2.8 mV

## 2019-11-03 NOTE — Progress Notes (Signed)
PPM Remote  

## 2020-01-30 ENCOUNTER — Ambulatory Visit (INDEPENDENT_AMBULATORY_CARE_PROVIDER_SITE_OTHER): Payer: Medicare Other | Admitting: *Deleted

## 2020-01-30 DIAGNOSIS — I442 Atrioventricular block, complete: Secondary | ICD-10-CM

## 2020-01-30 LAB — CUP PACEART REMOTE DEVICE CHECK
Battery Impedance: 2028 Ohm
Battery Remaining Longevity: 31 mo
Battery Voltage: 2.73 V
Brady Statistic AP VP Percent: 95 %
Brady Statistic AP VS Percent: 0 %
Brady Statistic AS VP Percent: 4 %
Brady Statistic AS VS Percent: 0 %
Date Time Interrogation Session: 20210709112223
Implantable Lead Implant Date: 20040416
Implantable Lead Implant Date: 20040416
Implantable Lead Location: 753859
Implantable Lead Location: 753860
Implantable Lead Model: 5076
Implantable Lead Model: 5092
Implantable Pulse Generator Implant Date: 20111221
Lead Channel Impedance Value: 446 Ohm
Lead Channel Impedance Value: 673 Ohm
Lead Channel Pacing Threshold Amplitude: 0.5 V
Lead Channel Pacing Threshold Amplitude: 1.25 V
Lead Channel Pacing Threshold Pulse Width: 0.4 ms
Lead Channel Pacing Threshold Pulse Width: 0.4 ms
Lead Channel Setting Pacing Amplitude: 2 V
Lead Channel Setting Pacing Amplitude: 2.5 V
Lead Channel Setting Pacing Pulse Width: 0.4 ms
Lead Channel Setting Sensing Sensitivity: 2.8 mV

## 2020-02-03 NOTE — Progress Notes (Signed)
Remote pacemaker transmission.   

## 2020-11-21 DEATH — deceased
